# Patient Record
Sex: Male | Born: 1960
Health system: Southern US, Community
[De-identification: ages and names within clinical notes are randomized; demographics above are authoritative.]

## PROBLEM LIST (undated history)

## (undated) DIAGNOSIS — D509 Iron deficiency anemia, unspecified: Secondary | ICD-10-CM

## (undated) DIAGNOSIS — K449 Diaphragmatic hernia without obstruction or gangrene: Secondary | ICD-10-CM

## (undated) DIAGNOSIS — I719 Aortic aneurysm of unspecified site, without rupture: Secondary | ICD-10-CM

## (undated) DIAGNOSIS — E785 Hyperlipidemia, unspecified: Secondary | ICD-10-CM

## (undated) DIAGNOSIS — T7840XA Allergy, unspecified, initial encounter: Secondary | ICD-10-CM

## (undated) DIAGNOSIS — G4730 Sleep apnea, unspecified: Secondary | ICD-10-CM

## (undated) DIAGNOSIS — I499 Cardiac arrhythmia, unspecified: Secondary | ICD-10-CM

## (undated) DIAGNOSIS — E669 Obesity, unspecified: Secondary | ICD-10-CM

## (undated) DIAGNOSIS — E559 Vitamin D deficiency, unspecified: Secondary | ICD-10-CM

## (undated) DIAGNOSIS — N400 Enlarged prostate without lower urinary tract symptoms: Secondary | ICD-10-CM

## (undated) DIAGNOSIS — M549 Dorsalgia, unspecified: Secondary | ICD-10-CM

## (undated) DIAGNOSIS — R112 Nausea with vomiting, unspecified: Secondary | ICD-10-CM

## (undated) DIAGNOSIS — I10 Essential (primary) hypertension: Secondary | ICD-10-CM

## (undated) DIAGNOSIS — K219 Gastro-esophageal reflux disease without esophagitis: Secondary | ICD-10-CM

## (undated) DIAGNOSIS — M2550 Pain in unspecified joint: Secondary | ICD-10-CM

## (undated) DIAGNOSIS — Z9889 Other specified postprocedural states: Secondary | ICD-10-CM

## (undated) DIAGNOSIS — R600 Localized edema: Secondary | ICD-10-CM

## (undated) DIAGNOSIS — N281 Cyst of kidney, acquired: Secondary | ICD-10-CM

## (undated) HISTORY — DX: Dorsalgia, unspecified: M54.9

## (undated) HISTORY — DX: Cyst of kidney, acquired: N28.1

## (undated) HISTORY — DX: Hyperlipidemia, unspecified: E78.5

## (undated) HISTORY — DX: Pain in unspecified joint: M25.50

## (undated) HISTORY — DX: Diaphragmatic hernia without obstruction or gangrene: K44.9

## (undated) HISTORY — PX: MOUTH SURGERY: SHX715

## (undated) HISTORY — DX: Other specified postprocedural states: R11.2

## (undated) HISTORY — DX: Essential (primary) hypertension: I10

## (undated) HISTORY — PX: VASECTOMY: SHX75

## (undated) HISTORY — DX: Obesity, unspecified: E66.9

## (undated) HISTORY — DX: Cardiac arrhythmia, unspecified: I49.9

## (undated) HISTORY — DX: Other specified postprocedural states: Z98.890

## (undated) HISTORY — DX: Gastro-esophageal reflux disease without esophagitis: K21.9

## (undated) HISTORY — DX: Allergy, unspecified, initial encounter: T78.40XA

## (undated) HISTORY — DX: Vitamin D deficiency, unspecified: E55.9

## (undated) HISTORY — DX: Sleep apnea, unspecified: G47.30

## (undated) HISTORY — DX: Iron deficiency anemia, unspecified: D50.9

## (undated) HISTORY — DX: Benign prostatic hyperplasia without lower urinary tract symptoms: N40.0

## (undated) HISTORY — DX: Aortic aneurysm of unspecified site, without rupture: I71.9

## (undated) HISTORY — DX: Localized edema: R60.0

---

## 2000-03-29 ENCOUNTER — Encounter: Payer: Self-pay | Admitting: Specialist

## 2000-03-29 ENCOUNTER — Ambulatory Visit (HOSPITAL_COMMUNITY): Admission: RE | Admit: 2000-03-29 | Discharge: 2000-03-29 | Payer: Self-pay | Admitting: Specialist

## 2000-06-09 HISTORY — PX: OTHER SURGICAL HISTORY: SHX169

## 2004-10-21 ENCOUNTER — Ambulatory Visit: Payer: Self-pay | Admitting: Gastroenterology

## 2004-12-01 ENCOUNTER — Ambulatory Visit: Payer: Self-pay | Admitting: Gastroenterology

## 2004-12-01 ENCOUNTER — Encounter (INDEPENDENT_AMBULATORY_CARE_PROVIDER_SITE_OTHER): Payer: Self-pay | Admitting: Specialist

## 2007-03-08 ENCOUNTER — Ambulatory Visit (HOSPITAL_COMMUNITY): Admission: RE | Admit: 2007-03-08 | Discharge: 2007-03-08 | Payer: Self-pay | Admitting: Family Medicine

## 2007-07-17 HISTORY — PX: OTHER SURGICAL HISTORY: SHX169

## 2009-06-09 LAB — HM MAMMOGRAPHY

## 2010-09-09 ENCOUNTER — Encounter: Payer: Self-pay | Admitting: Family Medicine

## 2013-01-23 ENCOUNTER — Other Ambulatory Visit (INDEPENDENT_AMBULATORY_CARE_PROVIDER_SITE_OTHER): Payer: Managed Care, Other (non HMO)

## 2013-01-23 DIAGNOSIS — Z0000 Encounter for general adult medical examination without abnormal findings: Secondary | ICD-10-CM

## 2013-01-23 LAB — POCT CBC
Lymph, poc: 1.6 (ref 0.6–3.4)
MCH, POC: 30.5 pg (ref 27–31.2)
MCHC: 34.1 g/dL (ref 31.8–35.4)
MPV: 6.7 fL (ref 0–99.8)
POC Granulocyte: 3.6 (ref 2–6.9)
POC LYMPH PERCENT: 28.3 %L (ref 10–50)
Platelet Count, POC: 182 10*3/uL (ref 142–424)
RDW, POC: 13.1 %
WBC: 5.8 10*3/uL (ref 4.6–10.2)

## 2013-01-25 LAB — NMR, LIPOPROFILE
Cholesterol: 171 mg/dL (ref <200)
LDL Particle Number: 1445 nmol/L — ABNORMAL HIGH (ref <1000)
LDL Size: 20.7 nm (ref >20.5)
LDLC SERPL CALC-MCNC: 97 mg/dL (ref <100)
LP-IR Score: 63 — ABNORMAL HIGH (ref <=45)
Triglycerides by NMR: 150 mg/dL — ABNORMAL HIGH (ref <150)

## 2013-01-25 LAB — HEPATIC FUNCTION PANEL
ALT: 59 IU/L — ABNORMAL HIGH (ref 0–44)
Alkaline Phosphatase: 80 IU/L (ref 39–117)
Total Protein: 6.1 g/dL (ref 6.0–8.5)

## 2013-01-25 LAB — BMP8+EGFR
Calcium: 9 mg/dL (ref 8.7–10.2)
Chloride: 100 mmol/L (ref 97–108)
Glucose: 87 mg/dL (ref 65–99)
Potassium: 4.5 mmol/L (ref 3.5–5.2)
Sodium: 141 mmol/L (ref 134–144)

## 2013-01-30 ENCOUNTER — Encounter: Payer: Self-pay | Admitting: Family Medicine

## 2013-01-30 ENCOUNTER — Ambulatory Visit (INDEPENDENT_AMBULATORY_CARE_PROVIDER_SITE_OTHER): Payer: Managed Care, Other (non HMO) | Admitting: Family Medicine

## 2013-01-30 VITALS — BP 132/84 | HR 65 | Temp 98.4°F | Ht 69.5 in | Wt 224.0 lb

## 2013-01-30 DIAGNOSIS — L819 Disorder of pigmentation, unspecified: Secondary | ICD-10-CM

## 2013-01-30 DIAGNOSIS — N529 Male erectile dysfunction, unspecified: Secondary | ICD-10-CM | POA: Insufficient documentation

## 2013-01-30 DIAGNOSIS — Q828 Other specified congenital malformations of skin: Secondary | ICD-10-CM

## 2013-01-30 DIAGNOSIS — F411 Generalized anxiety disorder: Secondary | ICD-10-CM | POA: Insufficient documentation

## 2013-01-30 DIAGNOSIS — K219 Gastro-esophageal reflux disease without esophagitis: Secondary | ICD-10-CM | POA: Insufficient documentation

## 2013-01-30 DIAGNOSIS — Z0000 Encounter for general adult medical examination without abnormal findings: Secondary | ICD-10-CM

## 2013-01-30 DIAGNOSIS — N400 Enlarged prostate without lower urinary tract symptoms: Secondary | ICD-10-CM | POA: Insufficient documentation

## 2013-01-30 DIAGNOSIS — I10 Essential (primary) hypertension: Secondary | ICD-10-CM | POA: Insufficient documentation

## 2013-01-30 DIAGNOSIS — M545 Low back pain, unspecified: Secondary | ICD-10-CM | POA: Insufficient documentation

## 2013-01-30 DIAGNOSIS — E785 Hyperlipidemia, unspecified: Secondary | ICD-10-CM | POA: Insufficient documentation

## 2013-01-30 LAB — POCT URINALYSIS DIPSTICK
Glucose, UA: NEGATIVE
Leukocytes, UA: NEGATIVE
Spec Grav, UA: 1.02
Urobilinogen, UA: NEGATIVE

## 2013-01-30 LAB — POCT UA - MICROSCOPIC ONLY
Epithelial cells, urine per micros: NEGATIVE
WBC, Ur, HPF, POC: NEGATIVE

## 2013-01-30 MED ORDER — PRAVASTATIN SODIUM 40 MG PO TABS: 40.0000 mg | ORAL_TABLET | Freq: Every day | ORAL | Status: DC

## 2013-01-30 NOTE — Addendum Note (Signed)
Addended by: Roselyn Reef on: 01/30/2013 04:39 PM   Modules accepted: Orders

## 2013-01-30 NOTE — Progress Notes (Signed)
Subjective:    Patient ID: Jacob Cline, male    DOB: 12-22-1960, 52 y.o.   MRN: [id number]  HPIPT HERE TODAY FOR ANNUAL PHYSICAL. Patient's active problems were populated. On health maintenance parameters he will be given an FOBT today. Patient is not taking he is cholesterol medicine anymore. He is also not had the Paxil for a couple of years. He is also taking Levitra and Maxzide for blood pressure.   There are no active problems to display for this patient.  Outpatient Encounter Prescriptions as of 01/30/2013  Medication Sig Dispense Refill  . aspirin 81 MG chewable tablet Chew 81 mg by mouth daily.        . cholecalciferol (VITAMIN D) 1000 UNITS tablet Take 3,000 Units by mouth daily.      . diclofenac (VOLTAREN) 75 MG EC tablet Take 75 mg by mouth 2 (two) times daily.        [name] omeprazole (PRILOSEC) 20 MG capsule Take 20 mg by mouth daily.        [name] triamterene-hydrochlorothiazide (MAXZIDE-25) 37.5-25 MG per tablet Take 1 tablet by mouth daily.        . vardenafil (LEVITRA) 20 MG tablet Take 20 mg by mouth daily.        . [DISCONTINUED] ergocalciferol (VITAMIN D2) 50000 UNITS capsule Take 50,000 Units by mouth once a week.        . [DISCONTINUED] niacin-lovastatin (ADVICOR) 1000-20 MG 24 hr tablet Take 1 tablet by mouth at bedtime.        . [DISCONTINUED] PARoxetine (PAXIL-CR) 12.5 MG 24 hr tablet Take 12.5 mg by mouth every morning.         No facility-administered encounter medications on file as of 01/30/2013.       Review of Systems  Constitutional: Negative.   HENT: Negative.   Eyes: Negative.   Respiratory: Negative.   Cardiovascular: Negative.   Gastrointestinal: Negative.   Endocrine: Negative.   Genitourinary: Negative.   Musculoskeletal: Positive for back pain.  Skin: Positive for color change (DISCOLORATION OF SKIN- FACE AND BILATERAL LOWER EXTREMITIES.).  Allergic/Immunologic: Negative.   Neurological: Negative.   Hematological: Negative.    Psychiatric/Behavioral: Negative.        Objective:   Physical Exam  Nursing note and vitals reviewed. Constitutional: He is oriented to person, place, and time. He appears well-developed and well-nourished. No distress.  HENT:  Head: Normocephalic and atraumatic.  Right Ear: External ear normal.  Left Ear: External ear normal.  Mouth/Throat: Oropharynx is clear and moist. No oropharyngeal exudate.  Nasal congestion bilaterally  Eyes: Conjunctivae and EOM are normal. Pupils are equal, round, and reactive to light. Right eye exhibits no discharge. Left eye exhibits no discharge. No scleral icterus.  Neck: Normal range of motion. Neck supple. No thyromegaly present.  Cardiovascular: Normal rate, regular rhythm and intact distal pulses.   Murmur heard. At 72 per minute  Pulmonary/Chest: Effort normal and breath sounds normal. No respiratory distress. He has no wheezes. He has no rales.  Abdominal: Soft. Bowel sounds are normal. He exhibits no mass. There is tenderness (minimal epigastric tenderness). There is no rebound and no guarding.  Genitourinary: Rectum normal and penis normal.  Rectal exam was negative. Prostate exam was slightly enlarged without any nodules. No hernia was palpated.  Musculoskeletal: Normal range of motion. He exhibits no edema and no tenderness.  Lymphadenopathy:    He has no cervical adenopathy.  Neurological: He is alert and oriented to person, place, and time.  He has normal reflexes.  Skin: Skin is warm and dry. No rash noted. No erythema. No pallor.  Skin discoloration both lower legs. Seborrheic keratosis left temple  Psychiatric: He has a normal mood and affect. His behavior is normal. Judgment and thought content normal.   BP 132/84  Pulse 65  Temp(Src) 98.4 F (36.9 C) (Oral)  Ht 5' 9.5" (1.765 m)  Wt 224 lb (101.606 kg)  BMI 32.62 kg/m2 Cryotherapy to one skin tag in each groin Patient tolerated procedure well       Assessment & Plan:    1. Annual physical exam   2. Hypertension   3. Hyperlipidemia   4. Hiatal hernia with gastroesophageal reflux   5. BPH (benign prostatic hyperplasia)   6. Anxiety state, unspecified, history of   7. Low back pain   8. Erectile dysfunction   9. Accessory skin tags, cryotherapy one each groin    Orders Placed This Encounter  Procedures  . POCT urinalysis dipstick  . POCT UA - Microscopic Only  -repeat liver function tests because of elevated tests, his recent lab work Meds ordered this encounter  Medications  . cholecalciferol (VITAMIN D) 1000 UNITS tablet    Sig: Take 3,000 Units by mouth daily.  . pravastatin (PRAVACHOL) 40 MG tablet    Sig: Take 1 tablet (40 mg total) by mouth daily.    Dispense:  90 tablet    Refill:  3   Patient Instructions  Continue current medications. Continue good therapeutic lifestyle changes.  Fall precautions discussed with patient. Schedule your flu vaccine the first of October. Follow up as planned and earlier as needed.  Increase vitamin D3  To 5000 daily We will make a referral to the dermatologist to evaluate the facial lesion and a hemosiderin deposits in the leg After starting new cholesterol medicine if muscle aches or myalgias return discontinue the medication We will do liver function test today in followup to the previous one that was elevated    Nyra Capes MD

## 2013-01-30 NOTE — Addendum Note (Signed)
Addended by: Magdalene River on: 01/30/2013 04:25 PM   Modules accepted: Orders

## 2013-01-30 NOTE — Addendum Note (Signed)
Addended by: Magdalene River on: 01/30/2013 04:31 PM   Modules accepted: Orders

## 2013-01-30 NOTE — Patient Instructions (Addendum)
Continue current medications. Continue good therapeutic lifestyle changes.  Fall precautions discussed with patient. Schedule your flu vaccine the first of October. Follow up as planned and earlier as needed.  Increase vitamin D3  To 5000 daily We will make a referral to the dermatologist to evaluate the facial lesion and a hemosiderin deposits in the leg After starting new cholesterol medicine if muscle aches or myalgias return discontinue the medication We will do liver function test today in followup to the previous one that was elevated

## 2013-01-31 LAB — HEPATIC FUNCTION PANEL
Albumin: 4.2 g/dL (ref 3.5–5.5)
Total Protein: 6.5 g/dL (ref 6.0–8.5)

## 2013-02-01 LAB — URINE CULTURE: Organism ID, Bacteria: NO GROWTH

## 2013-03-14 ENCOUNTER — Other Ambulatory Visit: Payer: Self-pay | Admitting: Family Medicine

## 2013-03-26 ENCOUNTER — Ambulatory Visit: Payer: Managed Care, Other (non HMO) | Admitting: Family Medicine

## 2013-04-19 ENCOUNTER — Emergency Department (HOSPITAL_COMMUNITY)
Admission: EM | Admit: 2013-04-19 | Discharge: 2013-04-20 | Disposition: A | Discharge status: Home / Self Care | Payer: Managed Care, Other (non HMO) | Attending: Emergency Medicine | Admitting: Emergency Medicine

## 2013-04-19 ENCOUNTER — Encounter (HOSPITAL_COMMUNITY): Payer: Self-pay | Admitting: Emergency Medicine

## 2013-04-19 ENCOUNTER — Emergency Department (HOSPITAL_COMMUNITY): Payer: Managed Care, Other (non HMO)

## 2013-04-19 ENCOUNTER — Other Ambulatory Visit: Payer: Self-pay

## 2013-04-19 DIAGNOSIS — E785 Hyperlipidemia, unspecified: Secondary | ICD-10-CM | POA: Insufficient documentation

## 2013-04-19 DIAGNOSIS — E669 Obesity, unspecified: Secondary | ICD-10-CM | POA: Insufficient documentation

## 2013-04-19 DIAGNOSIS — R079 Chest pain, unspecified: Secondary | ICD-10-CM

## 2013-04-19 DIAGNOSIS — Z79899 Other long term (current) drug therapy: Secondary | ICD-10-CM | POA: Insufficient documentation

## 2013-04-19 DIAGNOSIS — Z87448 Personal history of other diseases of urinary system: Secondary | ICD-10-CM | POA: Insufficient documentation

## 2013-04-19 DIAGNOSIS — Z8719 Personal history of other diseases of the digestive system: Secondary | ICD-10-CM | POA: Insufficient documentation

## 2013-04-19 DIAGNOSIS — Z7982 Long term (current) use of aspirin: Secondary | ICD-10-CM | POA: Insufficient documentation

## 2013-04-19 DIAGNOSIS — I10 Essential (primary) hypertension: Secondary | ICD-10-CM | POA: Insufficient documentation

## 2013-04-19 DIAGNOSIS — Z791 Long term (current) use of non-steroidal anti-inflammatories (NSAID): Secondary | ICD-10-CM | POA: Insufficient documentation

## 2013-04-19 LAB — BASIC METABOLIC PANEL
BUN: 14 mg/dL (ref 6–23)
CO2: 31 mEq/L (ref 19–32)
Chloride: 99 mEq/L (ref 96–112)
Creatinine, Ser: 0.84 mg/dL (ref 0.50–1.35)
GFR calc Af Amer: >90 mL/min (ref >90)
Glucose, Bld: 95 mg/dL (ref 70–99)

## 2013-04-19 LAB — CBC
HCT: 49.6 % (ref 39.0–52.0)
Hemoglobin: 17.9 g/dL — ABNORMAL HIGH (ref 13.0–17.0)
MCH: 32.3 pg (ref 26.0–34.0)
MCV: 89.5 fL (ref 78.0–100.0)
Platelets: 196 10*3/uL (ref 150–400)
RDW: 12.9 % (ref 11.5–15.5)
WBC: 9.5 10*3/uL (ref 4.0–10.5)

## 2013-04-19 NOTE — ED Notes (Signed)
C/o constant dull pressure to L side of chest and midsternal chest pain x 3-4 days.  Reports headache since this morning.  Denies sob, nausea, vomiting, or diaphoresis.

## 2013-04-20 LAB — TROPONIN I: Troponin I: <0.3 ng/mL (ref <0.30)

## 2013-04-20 NOTE — ED Provider Notes (Signed)
CSN: [id number]     Arrival date & time 04/19/13  1949 History   First MD Initiated Contact with Patient 04/19/13 2044     Chief Complaint  Patient presents with  . Chest Pain   (Consider location/radiation/quality/duration/timing/severity/associated sxs/prior Treatment) HPI Jacob Cline is a 51 y.o. male who presents to the emergency department for concern of chest pain.  Patient reports that he has had constant chest pain over last two days.  Described as sharp.  Center of chest.  No radiation.  Worse when he moves a certain way.  Nonpleuritic.  No hemoptysis.  Mild in severity.  No SOB.  No cough.  No other symptoms.  Past Medical History  Diagnosis Date  . Hypertension   . Obesity   . Hyperlipidemia   . Hiatal hernia   . Hemorrhoids   . BPH (benign prostatic hypertrophy)    Past Surgical History  Procedure Laterality Date  . Athroscopy of rt knee  02/02  . Henia - umbilical surgery  07/17/07  . Vasectomy    . Mouth surgery     Family History  Problem Relation Age of Onset  . Colon cancer      family history   . Prostate cancer      family history   . Diabetes Mother    History  Substance Use Topics  . Smoking status: Never Smoker   . Smokeless tobacco: Not on file  . Alcohol Use: No    Review of Systems  Constitutional: Negative for fever and chills.  HENT: Negative for congestion and sore throat.   Respiratory: Negative for cough.   Cardiovascular: Positive for chest pain.  Gastrointestinal: Negative for nausea, vomiting, abdominal pain, diarrhea and constipation.  Endocrine: Negative for polyuria.  Genitourinary: Negative for dysuria and hematuria.  Musculoskeletal: Negative for neck pain.  Skin: Negative for rash.  Neurological: Negative for headaches.  Psychiatric/Behavioral: Negative.   All other systems reviewed and are negative.    Allergies  Codeine; Lovastatin; and Niaspan  Home Medications   Current Outpatient Rx  Name  Route  Sig   Dispense  Refill  . aspirin 81 MG chewable tablet   Oral   Chew 81 mg by mouth daily.           . cholecalciferol (VITAMIN D) 1000 UNITS tablet   Oral   Take 3,000 Units by mouth daily.         . diclofenac (VOLTAREN) 75 MG EC tablet   Oral   Take 75 mg by mouth 2 (two) times daily.           [name] omeprazole (PRILOSEC) 20 MG capsule   Oral   Take 20 mg by mouth daily.           [name] triamterene-hydrochlorothiazide (MAXZIDE-25) 37.5-25 MG per tablet      TAKE ONE TABLET BY MOUTH EVERY DAY   90 tablet   1   . vardenafil (LEVITRA) 20 MG tablet   Oral   Take 20 mg by mouth daily.            BP 125/89  Pulse 66  Temp(Src) 97.6 F (36.4 C) (Oral)  Resp 14  Ht 5\' 11"  (1.803 m)  Wt 219 lb 11.2 oz (99.655 kg)  BMI 30.66 kg/m2  SpO2 95% Physical Exam  Nursing note and vitals reviewed. Constitutional: He is oriented to person, place, and time. He appears well-developed and well-nourished. No distress.  HENT:  Head: Normocephalic and atraumatic.  Right Ear: External ear normal.  Left Ear: External ear normal.  Mouth/Throat: Oropharynx is clear and moist. No oropharyngeal exudate.  Eyes: Conjunctivae are normal. Pupils are equal, round, and reactive to light. Right eye exhibits no discharge.  Neck: Normal range of motion. Neck supple. No tracheal deviation present.  Cardiovascular: Normal rate, regular rhythm and intact distal pulses.   Pulmonary/Chest: Effort normal. No respiratory distress. He has no wheezes. He has no rales.  Abdominal: Soft. He exhibits no distension. There is no tenderness. There is no rebound and no guarding.  Musculoskeletal: Normal range of motion.  Neurological: He is alert and oriented to person, place, and time.  Skin: Skin is warm and dry. No rash noted. He is not diaphoretic.  Psychiatric: He has a normal mood and affect.    ED Course  Procedures (including critical care time) Labs Review Labs Reviewed  CBC - Abnormal; Notable for the  following:    Hemoglobin 17.9 (*)    MCHC 36.1 (*)    All other components within normal limits  BASIC METABOLIC PANEL - Abnormal; Notable for the following:    Potassium 3.4 (*)    All other components within normal limits  TROPONIN I  POCT I-STAT TROPONIN I   Imaging Review Dg Chest 2 View  04/19/2013   CLINICAL DATA:  Chest pain.  EXAM: CHEST  2 VIEW  COMPARISON:  None.  FINDINGS: Normal heart size with clear lung fields.  No bony abnormality.  IMPRESSION: No active cardiopulmonary disease.   Electronically Signed   By: Davonna Belling M.D.   On: 04/19/2013 20:49    EKG Interpretation   None       MDM   1. Chest pain    Jacob Cline is a 52 y.o. male who presents to the emergency department for concern of isolated chest pain.  Very atypical story for MI.  Delta troponin checked and negative.  Doubt PE or dissection based on history and exam.  Likely MSK in origin.  However, given that patient is 57 with some risk factors, recommend that he f/u with cardiology to discuss potential outpatient stress testing.  All questions and return precautions discussed including worsening of symptoms.  Patient discharged.    Arloa Koh, MD 04/21/13 [phone]

## 2013-04-20 NOTE — ED Notes (Signed)
PT given d/c instructions and verbalized understanding. NAD at this time. VS are stable.  

## 2013-04-21 NOTE — ED Provider Notes (Signed)
I saw and evaluated the patient, reviewed the resident's note and I agree with the findings and plan.  EKG Interpretation   None       EKG - NSR, rate 74, normal axis, normal intervals, nonspecific t wave changes, no priors for comparison.  52 yo male with hx of HTN, HLD presenting with several days of chest pain, constant since early this morning.  Pain resolved at time of my exam.  On exam, well appearing, normal heart sounds, no m/r/g, lungs CTAB.  He has some nonspecific EKG changes, on review of outside records, he has had some t wave inversions on prior ekg (although unable to visualize this ekg myself).  He ruled out for MI with delta troponin.  Due to EKG changes, have advised close cardiology follow up for further testing.   Clinical Impression: 1. Chest pain       Candyce Churn, MD 04/21/13 1041

## 2013-04-21 NOTE — ED Provider Notes (Signed)
I saw and evaluated the patient, reviewed the resident's note and I agree with the findings and plan.  EKG Interpretation   None         Candyce Churn, MD 04/21/13 1041

## 2013-05-28 ENCOUNTER — Encounter: Payer: Self-pay | Admitting: Cardiovascular Disease

## 2013-05-28 ENCOUNTER — Ambulatory Visit (INDEPENDENT_AMBULATORY_CARE_PROVIDER_SITE_OTHER): Payer: Managed Care, Other (non HMO) | Admitting: Cardiovascular Disease

## 2013-05-28 VITALS — BP 134/85 | HR 63 | Ht 71.0 in | Wt 224.0 lb

## 2013-05-28 DIAGNOSIS — R0789 Other chest pain: Secondary | ICD-10-CM

## 2013-05-28 DIAGNOSIS — I10 Essential (primary) hypertension: Secondary | ICD-10-CM

## 2013-05-28 DIAGNOSIS — E785 Hyperlipidemia, unspecified: Secondary | ICD-10-CM

## 2013-05-28 DIAGNOSIS — R079 Chest pain, unspecified: Secondary | ICD-10-CM

## 2013-05-28 DIAGNOSIS — R9431 Abnormal electrocardiogram [ECG] [EKG]: Secondary | ICD-10-CM

## 2013-05-28 NOTE — Assessment & Plan Note (Signed)
I've encouraged him to exercise regularly. This will also help her lose weight.

## 2013-05-28 NOTE — Assessment & Plan Note (Signed)
Has not tolerated statins in the past. We will continue to encourage him to work on a good diet and exercise program. We may need to send him to the lipid clinic.

## 2013-05-28 NOTE — Progress Notes (Signed)
Jacob CaneJames H Cline Date of Birth  [birth year]       [facility]    [place] [facility] [address]  [address], [address] [place], [place]  [address]   [place]  [address] [phone]     [phone]   Fax  [phone]    Fax [phone]  Problem List: 1. Chest pain:  seen in the emergency room 2. Hypertension 3. Hyperlipidemia   History of Present Illness:  I have seen Jacob Cline in the past for CP and abnormal ECG.  We have performed a cath in the past which was normal.   He also has had a stress myoview which was normal.    Jacob Cline had an episode of chest heaviness about a month ago.  Also associated with a head ache.  Was seen in the ER, work up was negative.   He has had some occasional recurent chest pain,.  Dull , shooting pain, comes and goes.   Also seems to have chest heaviness that comes on at random times ( not associated with any specific activity) which Seems to be relived with antiacids.  Has not seen his medical doctor.    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    He does not get any regular exercise.   He has taken Lovastatin in the past but stopped taking it due to  muscle aches.    Current Outpatient Prescriptions on File Prior to Visit  Medication Sig Dispense Refill  . aspirin 81 MG chewable tablet Chew 81 mg by mouth daily.        . diclofenac (VOLTAREN) 75 MG EC tablet Take 75 mg by mouth daily.       [name]. omeprazole (PRILOSEC) 20 MG capsule Take 20 mg by mouth daily.        [name]. triamterene-hydrochlorothiazide (MAXZIDE-25) 37.5-25 MG per tablet TAKE ONE TABLET BY MOUTH EVERY DAY  90 tablet  1  . vardenafil (LEVITRA) 20 MG tablet Take 20 mg by mouth daily.         No current facility-administered medications on file prior to visit.    Allergies  Allergen Reactions  . Codeine     Unknown   . Lovastatin     Makes me hurt alot  . Niaspan [Niacin Er]     Make me hurt alot    Past Medical History    Diagnosis Date  . Hypertension   . Obesity   . Hyperlipidemia   . Hiatal hernia   . Hemorrhoids   . BPH (benign prostatic hypertrophy)     Past Surgical History  Procedure Laterality Date  . Athroscopy of rt knee  02/02  . Henia - umbilical surgery  07/17/07  . Vasectomy    . Mouth surgery      History  Smoking status  . Never Smoker   Smokeless tobacco  . Not on file    History  Alcohol Use No    Family History  Problem Relation Age of Onset  . Colon cancer      family history   . Prostate cancer      family history   . Diabetes Mother     Reviw of Systems:  Reviewed in the HPI.  All other systems are negative.  Physical Exam: Blood pressure 134/85, pulse 63, height 5\' 11"  (1.803 m), weight 224 lb (101.606 kg). General: Well developed, well nourished, in no acute distress.  Head:  Normocephalic, atraumatic, sclera non-icteric, mucus membranes are moist,   Neck: Supple. Carotids are 2 + without bruits. No JVD   Lungs: Clear   Heart: RR, normal S1 and S2.  Abdomen: Soft, non-tender, non-distended with normal bowel sounds.  Msk:  Strength and tone are normal   Extremities: No clubbing or cyanosis. No edema.  Distal pedal pulses are 2+ and equal    Neuro: CN II - XII intact.  Alert and oriented X 3.   Psych:  Normal   ECG: Dec. 12, 2014:  NSR TWI in the ant. lat  Jan. 20, 2015:  The TWI have improved.   Assessment / Plan:

## 2013-05-28 NOTE — Patient Instructions (Signed)
Your physician recommends that you schedule a follow-up appointment in: 3 MONTHS   Your physician has requested that you have a lexiscan myoview. . Please follow instruction sheet, as given.

## 2013-05-28 NOTE — Assessment & Plan Note (Signed)
Changes seen back for a visit for chest discomfort and abnormal EKG. I have seen him in the past for similar complaints. He's had a normal heart catheterization. We also have performed a stress Myoview study which was normal.  Once again he presents with symptoms that are somewhat worrisome. The his pains are described as a chest discomfort which is a tightness. There is some question as to whether it's relieved with antacids.  He has a abnormal EKG with T-wave inversions in the inferior and lateral leads. We'll schedule him for a lexiscan myoview study.  I will see him back in the office in 3 months for followup visit.

## 2013-06-05 ENCOUNTER — Ambulatory Visit (INDEPENDENT_AMBULATORY_CARE_PROVIDER_SITE_OTHER): Payer: Managed Care, Other (non HMO)

## 2013-06-05 ENCOUNTER — Ambulatory Visit (INDEPENDENT_AMBULATORY_CARE_PROVIDER_SITE_OTHER): Payer: Managed Care, Other (non HMO) | Admitting: Family Medicine

## 2013-06-05 ENCOUNTER — Encounter: Payer: Self-pay | Admitting: Family Medicine

## 2013-06-05 VITALS — BP 122/79 | HR 67 | Temp 97.0°F | Ht 71.0 in | Wt 222.0 lb

## 2013-06-05 DIAGNOSIS — I10 Essential (primary) hypertension: Secondary | ICD-10-CM

## 2013-06-05 DIAGNOSIS — M25559 Pain in unspecified hip: Secondary | ICD-10-CM

## 2013-06-05 DIAGNOSIS — M25551 Pain in right hip: Secondary | ICD-10-CM

## 2013-06-05 DIAGNOSIS — E785 Hyperlipidemia, unspecified: Secondary | ICD-10-CM

## 2013-06-05 DIAGNOSIS — N400 Enlarged prostate without lower urinary tract symptoms: Secondary | ICD-10-CM

## 2013-06-05 DIAGNOSIS — E559 Vitamin D deficiency, unspecified: Secondary | ICD-10-CM

## 2013-06-05 DIAGNOSIS — R079 Chest pain, unspecified: Secondary | ICD-10-CM

## 2013-06-05 DIAGNOSIS — Z1212 Encounter for screening for malignant neoplasm of rectum: Secondary | ICD-10-CM

## 2013-06-05 MED ORDER — DICLOFENAC SODIUM 75 MG PO TBEC: 75.0000 mg | DELAYED_RELEASE_TABLET | Freq: Two times a day (BID) | ORAL | Status: DC

## 2013-06-05 NOTE — Patient Instructions (Addendum)
Continue current medications. Continue good therapeutic lifestyle changes which include good diet and exercise. Fall precautions discussed with patient. Schedule your flu vaccine if you haven't had it yet If you are over 55 years old - you may need Prevnar 13 or the adult Pneumonia vaccine. Watch sodium intake more closely Use warm wet compresses to right hip if possible 20 minutes 3 or 4 times daily Check with your insurance regarding the Prevnar vaccine and the Zostavax Do not take any diclofenac for one week Increase the omeprazole to one twice daily before breakfast and supper Hold the caffeine We will call you with the results of the FOBT once those results are unavailable We will let you know the results of the lab work once that is returned Samples of Levitra were given to the patient Wear support hose for one to 2 weeks to see if it helps your legs feel better

## 2013-06-05 NOTE — Progress Notes (Signed)
Subjective:    Patient ID: Jacob Cline, male    DOB: 06-04-1960, 53 y.o.   MRN: [id number]  HPI Pt here for follow up and management of chronic medical problems. Patient continues to have chest pain and is scheduled for a chemical stress test next week. He also has concerns about some right lateral leg pain. He also has concerns regarding right hip pain that is worse with sleeping at nighttime.       Patient Active Problem List   Diagnosis Date Noted  . Chest discomfort 05/28/2013  . Hypertension 01/30/2013  . Hyperlipidemia 01/30/2013  . Hiatal hernia with gastroesophageal reflux 01/30/2013  . BPH (benign prostatic hyperplasia) 01/30/2013  . Anxiety state, unspecified, history of 01/30/2013  . Low back pain 01/30/2013  . Erectile dysfunction 01/30/2013   Outpatient Encounter Prescriptions as of 06/05/2013  Medication Sig  . aspirin 81 MG chewable tablet Chew 81 mg by mouth daily.    . cholecalciferol (VITAMIN D) 1000 UNITS tablet Take 1,000 Units by mouth daily. 3 tablets daily  . Cinnamon 500 MG capsule 500 mg.  . diclofenac (VOLTAREN) 75 MG EC tablet Take 75 mg by mouth daily.   . Multiple Vitamin (MULTIVITAMIN) tablet 1 tablet.  . Omega-3 Fatty Acids (FISH OIL) 1000 MG CAPS 1 capsule.  [name] omeprazole (PRILOSEC) 20 MG capsule Take 20 mg by mouth daily.    [name] triamterene-hydrochlorothiazide (MAXZIDE-25) 37.5-25 MG per tablet TAKE ONE TABLET BY MOUTH EVERY DAY  . vardenafil (LEVITRA) 20 MG tablet Take 20 mg by mouth daily.     Review of Systems  Constitutional: Negative.   HENT: Negative.   Eyes: Negative.   Respiratory: Positive for chest tightness (left side - went to er [facility] in December for this). Negative for shortness of breath.   Cardiovascular: Negative.   Gastrointestinal: Negative.   Endocrine: Negative.   Genitourinary: Negative.   Musculoskeletal: Negative.   Skin: Negative.   Allergic/Immunologic: Negative.   Neurological: Negative.   Hematological:  Negative.   Psychiatric/Behavioral: Negative.        Objective:   Physical Exam  Nursing note and vitals reviewed. Constitutional: He is oriented to person, place, and time. He appears well-developed and well-nourished. He appears distressed (somewhat distressed and anxious regarding his aches and pains.).  HENT:  Head: Normocephalic and atraumatic.  Right Ear: External ear normal.  Left Ear: External ear normal.  Mouth/Throat: Oropharynx is clear and moist. No oropharyngeal exudate.  Nasal congestion bilaterally  Eyes: Conjunctivae and EOM are normal. Pupils are equal, round, and reactive to light. Right eye exhibits no discharge. Left eye exhibits no discharge. No scleral icterus.  Neck: Normal range of motion. Neck supple. No thyromegaly present.   No carotid bruits auscultated  Cardiovascular: Normal rate, regular rhythm, normal heart sounds and intact distal pulses.  Exam reveals no gallop and no friction rub.   No murmur heard. At 72 per minute  Pulmonary/Chest: Effort normal and breath sounds normal. No respiratory distress. He has no wheezes. He has no rales. He exhibits no tenderness.  No axillary nodes  Abdominal: Soft. Bowel sounds are normal. He exhibits no mass. There is no tenderness. There is no rebound and no guarding.  Obesity present and no inguinal nodes  Musculoskeletal: Normal range of motion. He exhibits edema ( minimal pedal edema bilaterally). He exhibits no tenderness.  Lymphadenopathy:    He has no cervical adenopathy.  Neurological: He is alert and oriented to person, place, and time. He  has normal reflexes. No cranial nerve deficit.  Skin: Skin is warm and dry. No rash noted. No erythema. No pallor.  Psychiatric: He has a normal mood and affect. His behavior is normal. Judgment and thought content normal.   BP 122/79  Pulse 67  Temp(Src) 97 F (36.1 C) (Oral)  Ht _0  (1.803 m)  Wt 222 lb (100.699 kg)  BMI 30.98 kg/m2  [facility] reading (PRIMARY) by   Dr.Moore-right hip --Narrowed hip joint space, osteoarthritis                                        Assessment & Plan:  1. BPH (benign prostatic hyperplasia) - POCT CBC; Future  2. Hyperlipidemia - POCT CBC; Future - NMR, lipoprofile; Future  3. Hypertension - POCT CBC; Future - BMP8+EGFR; Future - Hepatic function panel; Future  4. Vitamin D deficiency - Vit D  25 hydroxy (rtn osteoporosis monitoring); Future  5. Right hip pain - DG Hip Complete Right; Future  6. Chest pain  7. Screening for malignant neoplasm of the rectum - Fecal occult blood, imunochemical Patient Instructions  Continue current medications. Continue good therapeutic lifestyle changes which include good diet and exercise. Fall precautions discussed with patient. Schedule your flu vaccine if you haven't had it yet If you are over 21 years old - you may need Prevnar 39 or the adult Pneumonia vaccine. Watch sodium intake more closely Use warm wet compresses to right hip if possible 20 minutes 3 or 4 times daily Check with your insurance regarding the Prevnar vaccine and the Zostavax Do not take any diclofenac for one week Increase the omeprazole to one twice daily before breakfast and supper Hold the caffeine We will call you with the results of the FOBT once those results are unavailable We will let you know the results of the lab work once that is returned Samples of Levitra were given to the patient Wear support hose for one to 2 weeks to see if it helps your legs feel better   Arrie Senate MD

## 2013-06-07 LAB — FECAL OCCULT BLOOD, IMMUNOCHEMICAL: Fecal Occult Bld: NEGATIVE

## 2013-06-12 ENCOUNTER — Ambulatory Visit (HOSPITAL_COMMUNITY): Payer: Managed Care, Other (non HMO) | Attending: Cardiology | Admitting: Radiology

## 2013-06-12 VITALS — BP 139/89 | Ht 71.0 in | Wt 218.0 lb

## 2013-06-12 DIAGNOSIS — E785 Hyperlipidemia, unspecified: Secondary | ICD-10-CM | POA: Insufficient documentation

## 2013-06-12 DIAGNOSIS — R9431 Abnormal electrocardiogram [ECG] [EKG]: Secondary | ICD-10-CM

## 2013-06-12 DIAGNOSIS — I10 Essential (primary) hypertension: Secondary | ICD-10-CM | POA: Insufficient documentation

## 2013-06-12 DIAGNOSIS — R079 Chest pain, unspecified: Secondary | ICD-10-CM

## 2013-06-12 MED ORDER — TECHNETIUM TC 99M SESTAMIBI GENERIC - CARDIOLITE
11.0000 | Freq: Once | INTRAVENOUS | Status: AC | PRN
  Administered 2013-06-12: 11 via INTRAVENOUS

## 2013-06-12 MED ORDER — REGADENOSON 0.4 MG/5ML IV SOLN
0.4000 mg | Freq: Once | INTRAVENOUS | Status: AC
  Administered 2013-06-12: 0.4 mg via INTRAVENOUS

## 2013-06-12 MED ORDER — TECHNETIUM TC 99M SESTAMIBI GENERIC - CARDIOLITE
33.0000 | Freq: Once | INTRAVENOUS | Status: AC | PRN
  Administered 2013-06-12: 33 via INTRAVENOUS

## 2013-06-12 NOTE — Progress Notes (Signed)
[facility] SITE 3 NUCLEAR MED [address] [place], [place] [address] [phone]    Cardiology Nuclear Med Study  Jacob Cline is a 53 y.o. male     MRN : [id number]     DOB: January 07, 1961  Procedure Date: 06/12/2013  Nuclear Med Background Indication for Stress Test:  Evaluation for Ischemia History: No Known History of CAD;Echo;Previous Nuclear Study (no report)Nml Cardiac Risk Factors: Hypertension and Lipids  Symptoms:  Chest Pain   Nuclear Pre-Procedure Caffeine/Decaff Intake:  None > 12 hrs NPO After: 10:30pm   Lungs:  clear O2 Sat: 95% on room air. IV 0.9% NS with Angio Cath:  20g  IV Site: R Antecubital x 1, tolerated well IV Started by:  Irean HongPatsy Edwards, RN  Chest Size (in):  46 Cup Size: n/a  Height: 5\' 11"  (1.803 m)  Weight:  218 lb (98.884 kg)  BMI:  Body mass index is 30.42 kg/(m^2). Tech Comments: L.Gerhardt NP consulted about the changes on the patient's EKG. Pictures checked without any immediate problem. B.Cline to read the images later in the day. TJohnson    Nuclear Med Study 1 or 2 day study: 1 day  Stress Test Type:  Lexiscan  Reading MD: N/A  Order Authorizing Provider:  Kristeen MissPhilip Nahser, MD  Resting Radionuclide: Technetium 485m Sestamibi  Resting Radionuclide Dose: 11.0 mCi   Stress Radionuclide:  Technetium 7785m Sestamibi  Stress Radionuclide Dose: 33.0 mCi           Stress Protocol Rest HR: 60 Stress HR: 104  Rest BP: 139/89 Stress BP: 134/80  Exercise Time (min): n/a METS: n/a   Predicted Max HR: 166 bpm % Max HR: 62.65 bpm Rate Pressure Product: [id number]   Dose of Adenosine (mg):  n/a Dose of Lexiscan: 0.4 mg  Dose of Atropine (mg): n/a Dose of Dobutamine: n/a mcg/kg/min (at max HR)  Stress Test Technologist: Frederick Peerseresa Johnson, EMT-P  Nuclear Technologist:  Harlow AsaElizabeth Young, CNMT     Rest Procedure:  Myocardial perfusion imaging was performed at rest 45 minutes following the intravenous administration of Technetium 4485m Sestamibi. Rest  ECG: NSR with non-specific ST-T wave changes  Stress Procedure:  The patient received IV Lexiscan 0.4 mg over 15-seconds.  Technetium 7085m Sestamibi injected at 30-seconds.  Quantitative spect images were obtained after a 45 minute delay. Stress ECG: No significant ST segment change suggestive of ischemia.  QPS Raw Data Images:  Acquisition technically good; normal left ventricular size. Stress Images:  Normal homogeneous uptake in all areas of the myocardium. Rest Images:  Normal homogeneous uptake in all areas of the myocardium. Subtraction (SDS):  No evidence of ischemia. Transient Ischemic Dilatation (Normal <1.22): 1.02  Lung /Heart  :0.57 Quantitative Gated Spect Images QGS EDV:  95 ml QGS ESV:  41 ml  Impression Exercise Capacity:  Lexiscan with no exercise. BP Response:  Normal blood pressure response. Clinical Symptoms:  No chest pain or dyspnea. ECG Impression:  No significant ST segment change suggestive of ischemia (nondiagnostic TWI inferior lateral leads). Comparison with Prior Nuclear Study: No images to compare  Overall Impression:  Normal stress nuclear study.  LV Ejection Fraction: 57%.  LV Wall Motion:  NL LV Function; NL Wall Motion  Jacob Cline

## 2013-08-04 ENCOUNTER — Other Ambulatory Visit: Payer: Self-pay | Admitting: Family Medicine

## 2013-08-07 ENCOUNTER — Encounter: Payer: Self-pay | Admitting: Podiatry

## 2013-08-07 ENCOUNTER — Ambulatory Visit (INDEPENDENT_AMBULATORY_CARE_PROVIDER_SITE_OTHER): Payer: Managed Care, Other (non HMO) | Admitting: Podiatry

## 2013-08-07 VITALS — BP 130/86 | HR 69 | Ht 71.0 in | Wt 222.0 lb

## 2013-08-07 DIAGNOSIS — B351 Tinea unguium: Secondary | ICD-10-CM

## 2013-08-07 DIAGNOSIS — M79606 Pain in leg, unspecified: Secondary | ICD-10-CM

## 2013-08-07 DIAGNOSIS — Q828 Other specified congenital malformations of skin: Secondary | ICD-10-CM

## 2013-08-07 DIAGNOSIS — M79609 Pain in unspecified limb: Secondary | ICD-10-CM

## 2013-08-07 NOTE — Progress Notes (Signed)
Subjective: 53 year old male presents complaining of painful callus and toe nails.  Objective: Dermatologic: Porokeratotic lesions at 5th MPJ left foot. Pinch calluses under the first MPJ bilateral. Thick dystrophic nails on both great toes.  Orthopedic:  Rectus foot without gross deformities. Neurovascular status are within normal.   Assessment: Painful porokeratosis sub 5 bilateral. Onychomycosis both great toes.  Plan: All lesions and abnormal nails debrided. Return as needed.

## 2013-08-07 NOTE — Patient Instructions (Signed)
Seen for painful calluses and toe nails. All debrided. Return in 3 months or as needed.

## 2013-09-10 ENCOUNTER — Ambulatory Visit (INDEPENDENT_AMBULATORY_CARE_PROVIDER_SITE_OTHER): Payer: Managed Care, Other (non HMO) | Admitting: Cardiovascular Disease

## 2013-09-10 ENCOUNTER — Encounter: Payer: Self-pay | Admitting: Cardiovascular Disease

## 2013-09-10 VITALS — BP 140/80 | HR 68 | Ht 71.0 in | Wt 225.0 lb

## 2013-09-10 DIAGNOSIS — R0789 Other chest pain: Secondary | ICD-10-CM

## 2013-09-10 NOTE — Patient Instructions (Signed)
Continue same medications.   Your physician wants you to follow-up in: 1 year.  You will receive a reminder letter in the mail two months in advance. If you don't receive a letter, please call our office to schedule the follow-up appointment.  

## 2013-09-10 NOTE — Assessment & Plan Note (Signed)
Jacob MarshallJimmy is doing well. He's not having significant anginal pain. His Myoview study was normal. I've encouraged him to try to eat a little bit better. A person to try to exercise as much as possible. I'll see him again in one year.  His medical doctor has been managing his lipids/

## 2013-09-10 NOTE — Progress Notes (Signed)
Jacob Cline Date of Birth  24-Dec-1960       [facility]    [facility] [place] [facility] [address]  [address] [place], [place]  [address]   [place], [place]  [address] [phone]     [phone]   Fax  [phone]    Fax [phone]  Problem List: 1. Chest pain:  seen in the emergency room 2. Hypertension 3. Hyperlipidemia   History of Present Illness:  I have seen Jacob Cline in the past for CP and abnormal ECG.  We have performed a cath in the past which was normal.   He also has had a stress myoview which was normal.    Jacob Cline had an episode of chest heaviness about a month ago.  Also associated with a head ache.  Was seen in the ER, work up was negative.   He has had some occasional recurent chest pain,.  Dull , shooting pain, comes and goes.   Also seems to have chest heaviness that comes on at random times ( not associated with any specific activity) which Seems to be relived with antiacids.  Has not seen his medical doctor.    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    He does not get any regular exercise.   He has taken Lovastatin in the past but stopped taking it due to  muscle aches.    Sep 10, 2013:  Jacob Cline was last seen in January, 2015. He had a stress Myoview study for recurrent episodes of chest discomfort.  The Myoview showed no evidence of ischemia. His left ventricular systolic function was normal with an EF of 57%.  Still having some CP.   He works 14 hour days ( 2 AM to 4 PM)   so has limited time to exercise.  Tries not to eat any fried foods by sometimes does not have a good option.   Current Outpatient Prescriptions on File Prior to Visit  Medication Sig Dispense Refill  . aspirin 81 MG chewable tablet Chew 81 mg by mouth daily.        . cholecalciferol (VITAMIN D) 1000 UNITS tablet Take 1,000 Units by mouth daily. 3 tablets daily      . Cinnamon 500 MG capsule 500 mg.      . diclofenac (VOLTAREN) 75 MG EC  tablet Take 1 tablet (75 mg total) by mouth 2 (two) times daily.  60 tablet  2  . Multiple Vitamin (MULTIVITAMIN) tablet 1 tablet.      . Omega-3 Fatty Acids (FISH OIL) 1000 MG CAPS 1 capsule.      [name]. omeprazole (PRILOSEC) 20 MG capsule TAKE ONE CAPSULE BY MOUTH ONCE DAILY  90 capsule  0  . triamterene-hydrochlorothiazide (MAXZIDE-25) 37.5-25 MG per tablet TAKE ONE TABLET BY MOUTH EVERY DAY  90 tablet  1  . vardenafil (LEVITRA) 20 MG tablet Take 20 mg by mouth daily.         No current facility-administered medications on file prior to visit.    Allergies  Allergen Reactions  . Codeine     Unknown   . Lovastatin     Makes me hurt alot  . Niaspan [Niacin Er]     Make me hurt alot    Past Medical History  Diagnosis Date  . Hypertension   . Obesity   . Hyperlipidemia   . Hiatal hernia   . Hemorrhoids   . BPH (benign prostatic  hypertrophy)     Past Surgical History  Procedure Laterality Date  . Athroscopy of rt knee  02/02  . Henia - umbilical surgery  07/17/07  . Vasectomy    . Mouth surgery      History  Smoking status  . Never Smoker   Smokeless tobacco  . Never Used    History  Alcohol Use No    Family History  Problem Relation Age of Onset  . Colon cancer      family history   . Prostate cancer      family history   . Diabetes Mother     Reviw of Systems:  Reviewed in the HPI.  All other systems are negative.  Physical Exam: Blood pressure 140/80, pulse 68, height 5\' 11"  (1.803 m), weight 225 lb (102.059 kg). General: Well developed, well nourished, in no acute distress.  Head: Normocephalic, atraumatic, sclera non-icteric, mucus membranes are moist,   Neck: Supple. Carotids are 2 + without bruits. No JVD   Lungs: Clear   Heart: RR, normal S1 and S2.  Abdomen: Soft, non-tender, non-distended with normal bowel sounds.  Msk:  Strength and tone are normal   Extremities: No clubbing or cyanosis. No edema.  Distal pedal pulses are 2+ and equal     Neuro: CN II - XII intact.  Alert and oriented X 3.   Psych:  Normal   ECG: Dec. 12, 2014:  NSR TWI in the ant. lat  Jan. 20, 2015:  The TWI have improved.   Assessment / Plan:

## 2013-09-23 ENCOUNTER — Other Ambulatory Visit: Payer: Self-pay | Admitting: Family Medicine

## 2013-10-08 ENCOUNTER — Ambulatory Visit (INDEPENDENT_AMBULATORY_CARE_PROVIDER_SITE_OTHER): Payer: Managed Care, Other (non HMO) | Admitting: Family Medicine

## 2013-10-08 ENCOUNTER — Encounter: Payer: Self-pay | Admitting: Family Medicine

## 2013-10-08 VITALS — BP 137/87 | HR 62 | Temp 98.3°F | Ht 71.0 in | Wt 224.0 lb

## 2013-10-08 DIAGNOSIS — E785 Hyperlipidemia, unspecified: Secondary | ICD-10-CM

## 2013-10-08 DIAGNOSIS — N529 Male erectile dysfunction, unspecified: Secondary | ICD-10-CM

## 2013-10-08 DIAGNOSIS — I10 Essential (primary) hypertension: Secondary | ICD-10-CM

## 2013-10-08 DIAGNOSIS — M25551 Pain in right hip: Secondary | ICD-10-CM

## 2013-10-08 DIAGNOSIS — M25559 Pain in unspecified hip: Secondary | ICD-10-CM

## 2013-10-08 DIAGNOSIS — L989 Disorder of the skin and subcutaneous tissue, unspecified: Secondary | ICD-10-CM

## 2013-10-08 DIAGNOSIS — E559 Vitamin D deficiency, unspecified: Secondary | ICD-10-CM

## 2013-10-08 DIAGNOSIS — N400 Enlarged prostate without lower urinary tract symptoms: Secondary | ICD-10-CM

## 2013-10-08 LAB — POCT URINALYSIS DIPSTICK
Bilirubin, UA: NEGATIVE
Blood, UA: NEGATIVE
Glucose, UA: NEGATIVE
Ketones, UA: NEGATIVE
LEUKOCYTES UA: NEGATIVE
NITRITE UA: NEGATIVE
PH UA: 6.5
Spec Grav, UA: 1.01
UROBILINOGEN UA: NEGATIVE

## 2013-10-08 LAB — POCT UA - MICROSCOPIC ONLY
Bacteria, U Microscopic: NEGATIVE
CASTS, UR, LPF, POC: NEGATIVE
CRYSTALS, UR, HPF, POC: NEGATIVE
EPITHELIAL CELLS, URINE PER MICROSCOPY: NEGATIVE
Mucus, UA: NEGATIVE
RBC, urine, microscopic: NEGATIVE
WBC, Ur, HPF, POC: NEGATIVE
YEAST UA: NEGATIVE

## 2013-10-08 NOTE — Patient Instructions (Addendum)
  Continue current medications. Continue good therapeutic lifestyle changes which include good diet and exercise. Fall precautions discussed with patient. If an FOBT was given today- please return it to our front desk. If you are over 55 years old - you may need Prevnar 13 or the adult Pneumonia vaccine.  Please check with her insurance regarding the Prevnar vaccine We will arrange an appointment to see the dermatologist about your nose and the orthopedist about your continued problems with your right hip

## 2013-10-08 NOTE — Progress Notes (Signed)
Subjective:    Patient ID: Jacob Cline, male    DOB: [birth year], 53 y.o.   MRN: [id number]  HPI Pt here for follow up and management of chronic medical problems. The patient does complain of some right hip pain and also has a couple of places on his chin and nose that he would like checked. He is also due for a PSA and prostate today. He will return to the office for the lab work. He is to check with his insurance regarding the Prevnar vaccine.         Patient Active Problem List   Diagnosis Date Noted  . Porokeratosis 08/07/2013  . Pain in lower limb 08/07/2013  . Onychomycosis 08/07/2013  . Chest discomfort 05/28/2013  . Hypertension 01/30/2013  . Hyperlipidemia 01/30/2013  . Hiatal hernia with gastroesophageal reflux 01/30/2013  . BPH (benign prostatic hyperplasia) 01/30/2013  . Anxiety state, unspecified, history of 01/30/2013  . Low back pain 01/30/2013  . Erectile dysfunction 01/30/2013   Outpatient Encounter Prescriptions as of 10/08/2013  Medication Sig  . aspirin 81 MG chewable tablet Chew 81 mg by mouth daily.    . cholecalciferol (VITAMIN D) 1000 UNITS tablet Take 1,000 Units by mouth daily. 3 tablets daily  . Cinnamon 500 MG capsule 500 mg.  . diclofenac (VOLTAREN) 75 MG EC tablet Take 1 tablet (75 mg total) by mouth 2 (two) times daily.  . Multiple Vitamin (MULTIVITAMIN) tablet 1 tablet.  . Omega-3 Fatty Acids (FISH OIL) 1000 MG CAPS 1 capsule.  [name] omeprazole (PRILOSEC) 20 MG capsule TAKE ONE CAPSULE BY MOUTH ONCE DAILY  . triamterene-hydrochlorothiazide (MAXZIDE-25) 37.5-25 MG per tablet TAKE ONE TABLET BY MOUTH ONCE DAILY  . vardenafil (LEVITRA) 20 MG tablet Take 20 mg by mouth daily.      Review of Systems  Constitutional: Negative.   HENT: Negative.   Eyes: Negative.   Respiratory: Negative.   Cardiovascular: Negative.   Gastrointestinal: Negative.   Endocrine: Negative.   Genitourinary: Negative.   Musculoskeletal: Positive for arthralgias (right  hip pain- xray done).  Skin: Negative.        Check skin under chin Check place on nose   Allergic/Immunologic: Negative.   Neurological: Negative.   Hematological: Negative.   Psychiatric/Behavioral: Negative.        Objective:   Physical Exam  Nursing note and vitals reviewed. Constitutional: He is oriented to person, place, and time. He appears well-developed and well-nourished.  Patient is overweight  HENT:  Head: Normocephalic and atraumatic.  Right Ear: External ear normal.  Left Ear: External ear normal.  Nose: Nose normal.  Mouth/Throat: Oropharynx is clear and moist. No oropharyngeal exudate.  Eyes: Conjunctivae and EOM are normal. Pupils are equal, round, and reactive to light. Right eye exhibits no discharge. Left eye exhibits no discharge. No scleral icterus.  Neck: Normal range of motion. Neck supple. No thyromegaly present.  No thyroid abnormality palpated  Cardiovascular: Normal rate, regular rhythm, normal heart sounds and intact distal pulses.  Exam reveals no gallop and no friction rub.   No murmur heard. At 72 per minute  Pulmonary/Chest: Effort normal and breath sounds normal. No respiratory distress. He has no wheezes. He has no rales. He exhibits no tenderness.  Abdominal: Soft. Bowel sounds are normal. He exhibits no mass. There is no tenderness. There is no rebound and no guarding.  Obesity  Genitourinary: Rectum normal and penis normal.  The prostate gland was enlarged and smooth. There were no rectal masses.  There is no inguinal hernia palpated there were no inguinal nodes palpated the external genitalia were within normal limits.  Musculoskeletal: Normal range of motion. He exhibits no edema and no tenderness.  There is hip discomfort with certain movements specifically in the right hip. His recent x-rays were reviewed with him and he was given a copy of the report indicating wear and tear arthritis and spurring of the femoral head and some sclerosis in  the acetabulum  Lymphadenopathy:    He has no cervical adenopathy.  Neurological: He is alert and oriented to person, place, and time. He has normal reflexes. No cranial nerve deficit.  Skin: Skin is warm and dry. No rash noted. No erythema. No pallor.  There continues to be a discoloration of the bridge of the nose. The patient is concerned about this.  Psychiatric: He has a normal mood and affect. His behavior is normal. Judgment and thought content normal.   BP 137/87  Pulse 62  Temp(Src) 98.3 F (36.8 C) (Oral)  Ht 5' 11"  (1.803 m)  Wt 224 lb (101.606 kg)  BMI 31.26 kg/m2        Assessment & Plan:  1. BPH (benign prostatic hyperplasia) - POCT CBC; Future - PSA, total and free; Future - POCT UA - Microscopic Only - POCT urinalysis dipstick  2. Hyperlipidemia - POCT CBC; Future - NMR, lipoprofile; Future  3. Hypertension - POCT CBC; Future - BMP8+EGFR; Future - Hepatic function panel; Future  4. Vitamin D deficiency - Vit D  25 hydroxy (rtn osteoporosis monitoring); Future  5. Skin lesion - Ambulatory referral to Dermatology  6. Right hip pain - Ambulatory referral to Orthopedic Surgery  7. Erectile dysfunction   No orders of the defined types were placed in this encounter.   Patient Instructions   Continue current medications. Continue good therapeutic lifestyle changes which include good diet and exercise. Fall precautions discussed with patient. If an FOBT was given today- please return it to our front desk. If you are over 68 years old - you may need Prevnar 78 or the adult Pneumonia vaccine.  Please check with her insurance regarding the Prevnar vaccine We will arrange an appointment to see the dermatologist about your nose and the orthopedist about your continued problems with your right hip   Arrie Senate MD

## 2013-10-23 ENCOUNTER — Other Ambulatory Visit (INDEPENDENT_AMBULATORY_CARE_PROVIDER_SITE_OTHER): Payer: Managed Care, Other (non HMO)

## 2013-10-23 DIAGNOSIS — R7309 Other abnormal glucose: Secondary | ICD-10-CM

## 2013-10-23 DIAGNOSIS — R7989 Other specified abnormal findings of blood chemistry: Secondary | ICD-10-CM

## 2013-10-23 DIAGNOSIS — E559 Vitamin D deficiency, unspecified: Secondary | ICD-10-CM

## 2013-10-23 DIAGNOSIS — E785 Hyperlipidemia, unspecified: Secondary | ICD-10-CM

## 2013-10-23 DIAGNOSIS — I10 Essential (primary) hypertension: Secondary | ICD-10-CM

## 2013-10-24 LAB — NMR, LIPOPROFILE
CHOLESTEROL: 183 mg/dL (ref 100–199)
HDL Cholesterol by NMR: 45 mg/dL (ref >39)
HDL PARTICLE NUMBER: 36.8 umol/L (ref >=30.5)
LDL PARTICLE NUMBER: 1308 nmol/L — AB (ref <1000)
LDL Size: 20.8 nm (ref >20.5)
LDLC SERPL CALC-MCNC: 111 mg/dL — AB (ref 0–99)
LP-IR Score: 67 — ABNORMAL HIGH (ref <=45)
SMALL LDL PARTICLE NUMBER: 587 nmol/L — AB (ref <=527)
Triglycerides by NMR: 137 mg/dL (ref 0–149)

## 2013-10-24 LAB — HEPATIC FUNCTION PANEL
ALT: 44 IU/L (ref 0–44)
AST: 25 IU/L (ref 0–40)
Albumin: 4.5 g/dL (ref 3.5–5.5)
Alkaline Phosphatase: 92 IU/L (ref 39–117)
Bilirubin, Direct: 0.13 mg/dL (ref 0.00–0.40)
Total Bilirubin: 0.5 mg/dL (ref 0.0–1.2)
Total Protein: 6.6 g/dL (ref 6.0–8.5)

## 2013-10-24 LAB — VITAMIN D 25 HYDROXY (VIT D DEFICIENCY, FRACTURES): VIT D 25 HYDROXY: 38.3 ng/mL (ref 30.0–100.0)

## 2013-10-24 LAB — BMP8+EGFR
BUN/Creatinine Ratio: 25 — ABNORMAL HIGH (ref 9–20)
BUN: 22 mg/dL (ref 6–24)
CALCIUM: 9 mg/dL (ref 8.7–10.2)
CHLORIDE: 102 mmol/L (ref 97–108)
CO2: 24 mmol/L (ref 18–29)
Creatinine, Ser: 0.88 mg/dL (ref 0.76–1.27)
GFR calc Af Amer: 112 mL/min/{1.73_m2} (ref >59)
GFR, EST NON AFRICAN AMERICAN: 97 mL/min/{1.73_m2} (ref >59)
GLUCOSE: 99 mg/dL (ref 65–99)
POTASSIUM: 4.6 mmol/L (ref 3.5–5.2)
SODIUM: 142 mmol/L (ref 134–144)

## 2013-10-25 ENCOUNTER — Telehealth: Payer: Self-pay | Admitting: Family Medicine

## 2013-10-25 LAB — POCT GLYCOSYLATED HEMOGLOBIN (HGB A1C): Hemoglobin A1C: 5.7

## 2013-10-25 NOTE — Addendum Note (Signed)
Addended by: Prescott GumLAND, JESSICA M on: 10/25/2013 08:50 AM   Modules accepted: Orders

## 2013-10-25 NOTE — Telephone Encounter (Signed)
Message copied by Doreatha MassedMOORE, MITZI on Fri Oct 25, 2013 10:53 AM ------      Message from: Ernestina PennaMOORE, DONALD W      Created: Thu Oct 24, 2013  7:15 AM       The blood sugar, creatinine, and electrolytes including potassium are within normal limit      All liver function tests are within normal limits      With advanced lipid testing, the total LDL Particle number remains elevated at 1308. Previously it was 1445. The LDL C. is elevated at 111. The triglycerides are within normal limits at 137 and improved from the past.------ please start Crestor10 mg daily, give a written prescription for Crestor 10 mg #90, one at bedtime for cholesterol also give him a coupon that will reduce his cost to $3 for 3 months. The patient should have his liver function tests checked in 4-6 weeks. He does not have to be fasting. If he develops muscle aches or myalgias beyond his normal he should discontinue the medication. He should continue aggressive therapeutic lifestyle changes which include diet exercise and weight loss.      The vitamin D level is 38.3. The gallbladder this is between 50 and 60.----- he should increase his vitamin D3 from 1000 daily 2 2000 daily. Finish the 1000s by taking 2 of them a day, then purchase the 2000 capsule and take one daily--- I preferred the in [employer], they have this at [facility] and the [facility] in [place] ------

## 2013-10-28 MED ORDER — ROSUVASTATIN CALCIUM 10 MG PO TABS: 10.0000 mg | ORAL_TABLET | Freq: Every day | ORAL | Status: DC

## 2013-10-28 NOTE — Addendum Note (Signed)
Addended by: Magdalene RiverBULLINS, JAMIE H on: 10/28/2013 08:18 AM   Modules accepted: Orders

## 2013-11-12 ENCOUNTER — Other Ambulatory Visit: Payer: Self-pay | Admitting: Family Medicine

## 2013-11-19 ENCOUNTER — Telehealth: Payer: Self-pay | Admitting: Family Medicine

## 2013-11-19 DIAGNOSIS — E785 Hyperlipidemia, unspecified: Secondary | ICD-10-CM

## 2013-11-19 MED ORDER — PRAVASTATIN SODIUM 40 MG PO TABS: 40.0000 mg | ORAL_TABLET | Freq: Every day | ORAL | Status: DC

## 2013-11-19 NOTE — Telephone Encounter (Signed)
Patient decided that he would [place]. Needs an appointment as late as possible.  He originally requested Dr. Charlann Boxerlin but he was to speak with their office and he hasn't. I will forward this to the referral department for scheduling.

## 2013-11-19 NOTE — Telephone Encounter (Signed)
Crestor is too expensive even with savings card. Can you prescribe a generic?

## 2013-11-19 NOTE — Telephone Encounter (Signed)
Med sent to pharmacy and lab ordered. Patient aware.

## 2013-11-19 NOTE — Telephone Encounter (Signed)
Start pravastatin 40 mg #30 one daily at bedtime recheck liver function tests in 4 weeks, patient does not have to be fasting.

## 2013-12-11 ENCOUNTER — Other Ambulatory Visit: Payer: Self-pay | Admitting: Family Medicine

## 2013-12-13 ENCOUNTER — Telehealth: Payer: Self-pay | Admitting: Family Medicine

## 2014-02-10 ENCOUNTER — Other Ambulatory Visit: Payer: Self-pay | Admitting: Family Medicine

## 2014-02-11 ENCOUNTER — Other Ambulatory Visit (INDEPENDENT_AMBULATORY_CARE_PROVIDER_SITE_OTHER): Payer: Managed Care, Other (non HMO)

## 2014-02-11 DIAGNOSIS — E785 Hyperlipidemia, unspecified: Secondary | ICD-10-CM

## 2014-02-12 LAB — HEPATIC FUNCTION PANEL
ALBUMIN: 4.8 g/dL (ref 3.5–5.5)
ALT: 66 IU/L — ABNORMAL HIGH (ref 0–44)
AST: 34 IU/L (ref 0–40)
Alkaline Phosphatase: 88 IU/L (ref 39–117)
Bilirubin, Direct: 0.15 mg/dL (ref 0.00–0.40)
Total Bilirubin: 0.5 mg/dL (ref 0.0–1.2)
Total Protein: 7.2 g/dL (ref 6.0–8.5)

## 2014-04-08 ENCOUNTER — Encounter: Payer: Self-pay | Admitting: Family Medicine

## 2014-04-08 ENCOUNTER — Ambulatory Visit (INDEPENDENT_AMBULATORY_CARE_PROVIDER_SITE_OTHER): Payer: Managed Care, Other (non HMO) | Admitting: Family Medicine

## 2014-04-08 VITALS — BP 140/88 | HR 78 | Temp 99.0°F | Ht 71.0 in | Wt 221.0 lb

## 2014-04-08 DIAGNOSIS — E559 Vitamin D deficiency, unspecified: Secondary | ICD-10-CM

## 2014-04-08 DIAGNOSIS — N400 Enlarged prostate without lower urinary tract symptoms: Secondary | ICD-10-CM

## 2014-04-08 DIAGNOSIS — E785 Hyperlipidemia, unspecified: Secondary | ICD-10-CM

## 2014-04-08 DIAGNOSIS — J069 Acute upper respiratory infection, unspecified: Secondary | ICD-10-CM

## 2014-04-08 DIAGNOSIS — I10 Essential (primary) hypertension: Secondary | ICD-10-CM

## 2014-04-08 DIAGNOSIS — N5201 Erectile dysfunction due to arterial insufficiency: Secondary | ICD-10-CM

## 2014-04-08 MED ORDER — AMOXICILLIN 500 MG PO CAPS: 500.0000 mg | ORAL_CAPSULE | Freq: Three times a day (TID) | ORAL | Status: DC

## 2014-04-08 NOTE — Patient Instructions (Addendum)
Continue current medications. Continue good therapeutic lifestyle changes which include good diet and exercise. Fall precautions discussed with patient. If an FOBT was given today- please return it to our front desk. If you are over 55 years old - you may need Prevnar 13 or the adult Pneumonia vaccine.  Flu Shots will be available at our office starting mid- September. Please call and schedule a FLU CLINIC APPOINTMENT.   Take Cialis 5 mg as directed Take antibiotic until completed Use saline nose spray regularly and drink plenty of fluids Use Mucinex maximum strength, plain, blue and white in color, 1 twice daily as needed for cough and congestion--- take this with a large glass of water

## 2014-04-08 NOTE — Progress Notes (Signed)
 Subjective:    Patient ID: Jacob Cline, male    DOB: 11/18/1960, 53 y.o.   MRN: [id number]  HPI Pt here for follow up and management of chronic medical problems. Recent complaints of a cough and head congestion for about 4 days.        Patient Active Problem List   Diagnosis Date Noted  . Porokeratosis 08/07/2013  . Pain in lower limb 08/07/2013  . Onychomycosis 08/07/2013  . Chest discomfort 05/28/2013  . Hypertension 01/30/2013  . Hyperlipidemia 01/30/2013  . Hiatal hernia with gastroesophageal reflux 01/30/2013  . BPH (benign prostatic hyperplasia) 01/30/2013  . Anxiety state, unspecified, history of 01/30/2013  . Low back pain 01/30/2013  . Erectile dysfunction 01/30/2013   Outpatient Encounter Prescriptions as of 04/08/2014  Medication Sig  . aspirin 81 MG chewable tablet Chew 81 mg by mouth daily.    . cholecalciferol (VITAMIN D) 1000 UNITS tablet Take 1,000 Units by mouth daily. 3 tablets daily  . Cinnamon 500 MG capsule 500 mg.  . diclofenac (VOLTAREN) 75 MG EC tablet TAKE ONE TABLET BY MOUTH TWICE DAILY  . Multiple Vitamin (MULTIVITAMIN) tablet 1 tablet.  . Omega-3 Fatty Acids (FISH OIL) 1000 MG CAPS 1 capsule.  . omeprazole (PRILOSEC) 20 MG capsule TAKE ONE CAPSULE BY MOUTH ONCE DAILY  . pravastatin (PRAVACHOL) 40 MG tablet TAKE ONE TABLET BY MOUTH AT BEDTIME  . triamterene-hydrochlorothiazide (MAXZIDE-25) 37.5-25 MG per tablet TAKE ONE TABLET BY MOUTH ONCE DAILY  . vardenafil (LEVITRA) 20 MG tablet Take 20 mg by mouth daily.    . [DISCONTINUED] rosuvastatin (CRESTOR) 10 MG tablet Take 1 tablet (10 mg total) by mouth daily.    Review of Systems  Constitutional: Negative.   HENT: Positive for congestion.   Eyes: Negative.   Respiratory: Positive for cough (x 4 days).   Cardiovascular: Negative.   Gastrointestinal: Negative.   Endocrine: Negative.   Genitourinary: Negative.   Musculoskeletal: Negative.   Skin: Negative.   Allergic/Immunologic:  Negative.   Neurological: Negative.   Hematological: Negative.   Psychiatric/Behavioral: Negative.        Objective:   Physical Exam  Constitutional: He is oriented to person, place, and time. He appears well-developed and well-nourished. No distress.  HENT:  Head: Normocephalic and atraumatic.  Right Ear: External ear normal.  Left Ear: External ear normal.  Mouth/Throat: Oropharynx is clear and moist. No oropharyngeal exudate.  The throat was slightly red posteriorly. Nasal congestion bilaterally with no sinus pressure tenderness  Eyes: Conjunctivae and EOM are normal. Pupils are equal, round, and reactive to light. Right eye exhibits no discharge. Left eye exhibits no discharge. No scleral icterus.  Neck: Normal range of motion. Neck supple. No thyromegaly present.  No anterior cervical nodes  Cardiovascular: Normal rate, regular rhythm, normal heart sounds and intact distal pulses.  Exam reveals no friction rub.   No murmur heard. Pulmonary/Chest: Effort normal and breath sounds normal. No respiratory distress. He has no wheezes. He has no rales. He exhibits no tenderness.  Raspy cough  Abdominal: Soft. Bowel sounds are normal. He exhibits no mass. There is no tenderness. There is no rebound and no guarding.  Obesity without tenderness  Musculoskeletal: Normal range of motion. He exhibits no edema.  Lymphadenopathy:    He has no cervical adenopathy.  Neurological: He is alert and oriented to person, place, and time. He has normal reflexes. No cranial nerve deficit.  Skin: Skin is warm and dry. No rash noted. No erythema. No   pallor.  Psychiatric: He has a normal mood and affect. His behavior is normal. Judgment and thought content normal.  Nursing note and vitals reviewed.  BP 140/88 mmHg  Pulse 78  Temp(Src) 99 F (37.2 C) (Oral)  Ht 5' 11" (1.803 m)  Wt 221 lb (100.245 kg)  BMI 30.84 kg/m2        Assessment & Plan:  1. Hyperlipidemia - POCT CBC; Future - NMR,  lipoprofile; Future  2. Essential hypertension - POCT CBC; Future - BMP8+EGFR; Future - Hepatic function panel; Future  3. BPH (benign prostatic hyperplasia) - POCT CBC; Future  4. Vitamin D deficiency - POCT CBC; Future - Vit D  25 hydroxy (rtn osteoporosis monitoring); Future  5. URI (upper respiratory infection) - amoxicillin (AMOXIL) 500 MG capsule; Take 1 capsule (500 mg total) by mouth 3 (three) times daily.  Dispense: 30 capsule; Refill: 0  6. Erectile dysfunction due to arterial insufficiency -Samples of Cialis given for patient  Patient Instructions  Continue current medications. Continue good therapeutic lifestyle changes which include good diet and exercise. Fall precautions discussed with patient. If an FOBT was given today- please return it to our front desk. If you are over 50 years old - you may need Prevnar 13 or the adult Pneumonia vaccine.  Flu Shots will be available at our office starting mid- September. Please call and schedule a FLU CLINIC APPOINTMENT.   Take Cialis 5 mg as directed Take antibiotic until completed Use saline nose spray regularly and drink plenty of fluids Use Mucinex maximum strength, plain, blue and white in color, 1 twice daily as needed for cough and congestion--- take this with a large glass of water   Don W. Moore MD   

## 2014-04-09 ENCOUNTER — Other Ambulatory Visit: Payer: Self-pay | Admitting: Family Medicine

## 2014-04-29 ENCOUNTER — Other Ambulatory Visit (INDEPENDENT_AMBULATORY_CARE_PROVIDER_SITE_OTHER): Payer: Managed Care, Other (non HMO)

## 2014-04-29 DIAGNOSIS — N400 Enlarged prostate without lower urinary tract symptoms: Secondary | ICD-10-CM

## 2014-04-29 DIAGNOSIS — I10 Essential (primary) hypertension: Secondary | ICD-10-CM

## 2014-04-29 DIAGNOSIS — E785 Hyperlipidemia, unspecified: Secondary | ICD-10-CM

## 2014-04-29 DIAGNOSIS — E559 Vitamin D deficiency, unspecified: Secondary | ICD-10-CM

## 2014-04-29 LAB — POCT CBC
Granulocyte percent: 67.3 %G (ref 37–80)
HEMATOCRIT: 52 % (ref 43.5–53.7)
HEMOGLOBIN: 17 g/dL (ref 14.1–18.1)
Lymph, poc: 1.9 (ref 0.6–3.4)
MCH: 29.9 pg (ref 27–31.2)
MCHC: 32.7 g/dL (ref 31.8–35.4)
MCV: 91.4 fL (ref 80–97)
MPV: 6.6 fL (ref 0–99.8)
POC Granulocyte: 4.8 (ref 2–6.9)
POC LYMPH %: 26.8 % (ref 10–50)
Platelet Count, POC: 196 10*3/uL (ref 142–424)
RBC: 5.7 M/uL (ref 4.69–6.13)
RDW, POC: 12.6 %
WBC: 7.1 10*3/uL (ref 4.6–10.2)

## 2014-04-29 NOTE — Progress Notes (Signed)
Lab only 

## 2014-04-30 LAB — NMR, LIPOPROFILE
Cholesterol: 137 mg/dL (ref 100–199)
HDL Cholesterol by NMR: 43 mg/dL (ref >39)
HDL Particle Number: 36.8 umol/L (ref >=30.5)
LDL Particle Number: 941 nmol/L (ref <1000)
LDL Size: 20.7 nm (ref >20.5)
LDL-C: 72 mg/dL (ref 0–99)
LP-IR Score: 65 — ABNORMAL HIGH (ref <=45)
SMALL LDL PARTICLE NUMBER: 488 nmol/L (ref <=527)
TRIGLYCERIDES BY NMR: 109 mg/dL (ref 0–149)

## 2014-04-30 LAB — BMP8+EGFR
BUN/Creatinine Ratio: 22 — ABNORMAL HIGH (ref 9–20)
BUN: 19 mg/dL (ref 6–24)
CALCIUM: 8.9 mg/dL (ref 8.7–10.2)
CO2: 28 mmol/L (ref 18–29)
CREATININE: 0.86 mg/dL (ref 0.76–1.27)
Chloride: 101 mmol/L (ref 97–108)
GFR, EST AFRICAN AMERICAN: 113 mL/min/{1.73_m2} (ref >59)
GFR, EST NON AFRICAN AMERICAN: 98 mL/min/{1.73_m2} (ref >59)
Glucose: 88 mg/dL (ref 65–99)
Potassium: 4.4 mmol/L (ref 3.5–5.2)
SODIUM: 139 mmol/L (ref 134–144)

## 2014-04-30 LAB — HEPATIC FUNCTION PANEL
ALK PHOS: 85 IU/L (ref 39–117)
ALT: 46 IU/L — AB (ref 0–44)
AST: 26 IU/L (ref 0–40)
Albumin: 4.1 g/dL (ref 3.5–5.5)
BILIRUBIN DIRECT: 0.12 mg/dL (ref 0.00–0.40)
BILIRUBIN TOTAL: 0.5 mg/dL (ref 0.0–1.2)
Total Protein: 6.3 g/dL (ref 6.0–8.5)

## 2014-04-30 LAB — VITAMIN D 25 HYDROXY (VIT D DEFICIENCY, FRACTURES): Vit D, 25-Hydroxy: 48.7 ng/mL (ref 30.0–100.0)

## 2014-05-15 ENCOUNTER — Other Ambulatory Visit: Payer: Self-pay | Admitting: Family Medicine

## 2014-05-27 ENCOUNTER — Ambulatory Visit (INDEPENDENT_AMBULATORY_CARE_PROVIDER_SITE_OTHER): Payer: Managed Care, Other (non HMO) | Admitting: Family Medicine

## 2014-05-27 ENCOUNTER — Encounter: Payer: Self-pay | Admitting: Family Medicine

## 2014-05-27 VITALS — BP 150/89 | HR 62 | Temp 97.0°F | Ht 71.0 in | Wt 229.0 lb

## 2014-05-27 DIAGNOSIS — S46211A Strain of muscle, fascia and tendon of other parts of biceps, right arm, initial encounter: Secondary | ICD-10-CM

## 2014-05-27 MED ORDER — HYDROCODONE-ACETAMINOPHEN 5-325 MG PO TABS: 1.0000 | ORAL_TABLET | Freq: Four times a day (QID) | ORAL | Status: DC | PRN

## 2014-05-27 NOTE — Progress Notes (Signed)
   Subjective:    Patient ID: Jacqulyn CaneJames H Harbeson, male    DOB: 10/04/1960, 54 y.o.   MRN: [id number]  HPI Patient is here for c/o torn muscle in his right biceps a week ago.  Review of Systems  Constitutional: Negative for fever.  HENT: Negative for ear pain.   Eyes: Negative for discharge.  Respiratory: Negative for cough.   Cardiovascular: Negative for chest pain.  Gastrointestinal: Negative for abdominal distention.  Endocrine: Negative for polyuria.  Genitourinary: Negative for difficulty urinating.  Musculoskeletal: Negative for gait problem and neck pain.  Skin: Negative for color change and rash.  Neurological: Negative for speech difficulty and headaches.  Psychiatric/Behavioral: Negative for agitation.           BP 150/89 mmHg  Pulse 62  Temp(Src) 97 F (36.1 C) (Oral)  Ht 5\' 11"  (1.803 m)  Wt 229 lb (103.874 kg)  BMI 31.95 kg/m2 Physical Exam  Right bicep tenderness and ecchymosis      Assessment & Plan:     ICD-9-CM ICD-10-CM   1. Biceps rupture, distal, right, initial encounter 841.8 S46.211A HYDROcodone-acetaminophen (NORCO) 5-325 MG per tablet   Continue voltaren  Return if symptoms worsen or fail to improve.  Deatra CanterWilliam J Torey Reinard FNP

## 2014-07-07 ENCOUNTER — Telehealth: Payer: Self-pay | Admitting: Family Medicine

## 2014-07-07 NOTE — Telephone Encounter (Signed)
He can take acetaminophen with diclofenac. He should be rechecked if he is continuing to have pain to find out what the cause of the pain is.

## 2014-07-07 NOTE — Telephone Encounter (Signed)
Patient states that he is using the diclofenac daily but he is still having a lot pain and wants to know if you approve for him to take acetaminophen with this.

## 2014-07-31 ENCOUNTER — Other Ambulatory Visit: Payer: Self-pay | Admitting: Family Medicine

## 2014-08-21 ENCOUNTER — Other Ambulatory Visit: Payer: Self-pay | Admitting: Family Medicine

## 2014-09-23 ENCOUNTER — Encounter: Payer: Self-pay | Admitting: Internal Medicine

## 2014-10-13 ENCOUNTER — Other Ambulatory Visit: Payer: Self-pay | Admitting: Family Medicine

## 2014-10-20 ENCOUNTER — Other Ambulatory Visit: Payer: Self-pay | Admitting: Family Medicine

## 2014-10-21 ENCOUNTER — Encounter: Payer: Self-pay | Admitting: *Deleted

## 2014-10-21 ENCOUNTER — Ambulatory Visit (INDEPENDENT_AMBULATORY_CARE_PROVIDER_SITE_OTHER): Payer: Managed Care, Other (non HMO) | Admitting: Family Medicine

## 2014-10-21 ENCOUNTER — Encounter: Payer: Self-pay | Admitting: Family Medicine

## 2014-10-21 VITALS — BP 126/82 | HR 84 | Temp 97.6°F | Ht 71.0 in | Wt 224.0 lb

## 2014-10-21 DIAGNOSIS — N400 Enlarged prostate without lower urinary tract symptoms: Secondary | ICD-10-CM | POA: Diagnosis not present

## 2014-10-21 DIAGNOSIS — E785 Hyperlipidemia, unspecified: Secondary | ICD-10-CM | POA: Diagnosis not present

## 2014-10-21 DIAGNOSIS — Z0000 Encounter for general adult medical examination without abnormal findings: Secondary | ICD-10-CM

## 2014-10-21 DIAGNOSIS — E559 Vitamin D deficiency, unspecified: Secondary | ICD-10-CM

## 2014-10-21 DIAGNOSIS — I10 Essential (primary) hypertension: Secondary | ICD-10-CM | POA: Diagnosis not present

## 2014-10-21 DIAGNOSIS — M25551 Pain in right hip: Secondary | ICD-10-CM

## 2014-10-21 LAB — POCT UA - MICROSCOPIC ONLY
BACTERIA, U MICROSCOPIC: NEGATIVE
CRYSTALS, UR, HPF, POC: NEGATIVE
Casts, Ur, LPF, POC: NEGATIVE
WBC, UR, HPF, POC: NEGATIVE
YEAST UA: NEGATIVE

## 2014-10-21 LAB — POCT URINALYSIS DIPSTICK
Bilirubin, UA: NEGATIVE
Glucose, UA: NEGATIVE
KETONES UA: NEGATIVE
Leukocytes, UA: NEGATIVE
Nitrite, UA: NEGATIVE
PH UA: 5
UROBILINOGEN UA: NEGATIVE

## 2014-10-21 NOTE — Patient Instructions (Addendum)
Continue current medications. Continue good therapeutic lifestyle changes which include good diet and exercise. Fall precautions discussed with patient. If an FOBT was given today- please return it to our front desk. If you are over 56 years old - you may need Prevnar 13 or the adult Pneumonia vaccine.  Flu Shots are still available at our office. If you still haven't had one please call to set up a nurse visit to get one.   After your visit with [place] today you will receive a survey in the mail or online from [employer] regarding your care with [place]. Please take a moment to fill this out. Your feedback is very important to [place] as you can help [place] better understand your patient needs as well as improve your experience and satisfaction. WE CARE ABOUT YOU!!!   The patient should follow-up with his orthopedist as planned

## 2014-10-21 NOTE — Progress Notes (Signed)
Subjective:    Patient ID: Jacob Cline, male    DOB: [birth year], 54 y.o.   MRN: [id number]  HPI Patient is here today for annual wellness exam and follow up of chronic medical problems which includes hypertension and hyperlipidemia. He is taking medications regularly. The patient's biggest complaint today is some pain in his right hip and he has plans to have hip surgery soon. He was noted to have degenerative changes in both hips as early as over 1 year ago. He denies chest pain, shortness of breath, problems with his GI tract including nausea vomiting diarrhea blood in the stool. He also denies any problems with voiding.      Patient Active Problem List   Diagnosis Date Noted  . Porokeratosis 08/07/2013  . Pain in lower limb 08/07/2013  . Onychomycosis 08/07/2013  . Chest discomfort 05/28/2013  . Hypertension 01/30/2013  . Hyperlipidemia 01/30/2013  . Hiatal hernia with gastroesophageal reflux 01/30/2013  . BPH (benign prostatic hyperplasia) 01/30/2013  . Anxiety state, unspecified, history of 01/30/2013  . Low back pain 01/30/2013  . Erectile dysfunction 01/30/2013   Outpatient Encounter Prescriptions as of 10/21/2014  Medication Sig  . aspirin 81 MG chewable tablet Chew 81 mg by mouth daily.    . cholecalciferol (VITAMIN D) 1000 UNITS tablet Take 1,000 Units by mouth daily. 3 tablets daily  . Cinnamon 500 MG capsule 500 mg.  . diclofenac (VOLTAREN) 75 MG EC tablet TAKE ONE TABLET BY MOUTH TWICE DAILY  . HYDROcodone-acetaminophen (NORCO) 5-325 MG per tablet Take 1 tablet by mouth every 6 (six) hours as needed for moderate pain.  . Multiple Vitamin (MULTIVITAMIN) tablet 1 tablet.  . Omega-3 Fatty Acids (FISH OIL) 1000 MG CAPS 1 capsule.  [name] omeprazole (PRILOSEC) 20 MG capsule TAKE ONE CAPSULE BY MOUTH ONCE DAILY  . pravastatin (PRAVACHOL) 40 MG tablet TAKE ONE TABLET BY MOUTH AT BEDTIME  . triamterene-hydrochlorothiazide (MAXZIDE-25) 37.5-25 MG per tablet TAKE ONE TABLET BY  MOUTH ONCE DAILY  . vardenafil (LEVITRA) 20 MG tablet Take 20 mg by mouth daily.    . [DISCONTINUED] diclofenac (VOLTAREN) 75 MG EC tablet TAKE ONE TABLET BY MOUTH TWICE DAILY   No facility-administered encounter medications on file as of 10/21/2014.      Review of Systems  Constitutional: Negative.   HENT: Negative.   Eyes: Negative.   Respiratory: Negative.   Cardiovascular: Negative.   Gastrointestinal: Negative.   Endocrine: Negative.   Genitourinary: Negative.   Musculoskeletal: Positive for arthralgias (right hip replacement soon ).  Skin: Negative.   Allergic/Immunologic: Negative.   Neurological: Negative.   Hematological: Negative.   Psychiatric/Behavioral: Negative.        Objective:   Physical Exam  Constitutional: He is oriented to person, place, and time. He appears well-developed and well-nourished. No distress.  HENT:  Head: Normocephalic and atraumatic.  Right Ear: External ear normal.  Left Ear: External ear normal.  Mouth/Throat: Oropharynx is clear and moist. No oropharyngeal exudate.  Minimal nasal congestion bilaterally  Eyes: Conjunctivae and EOM are normal. Pupils are equal, round, and reactive to light. Right eye exhibits no discharge. Left eye exhibits no discharge. No scleral icterus.  Neck: Normal range of motion. Neck supple. No thyromegaly present.  Cardiovascular: Normal rate, regular rhythm, normal heart sounds and intact distal pulses.  Exam reveals no gallop and no friction rub.   No murmur heard. Heart is regular at 72/m without murmur.  Pulmonary/Chest: Effort normal and breath sounds normal. No respiratory distress.  He has no wheezes. He has no rales. He exhibits no tenderness.  Chest clear anteriorly and posteriorly  Abdominal: Soft. Bowel sounds are normal. He exhibits no mass. There is no tenderness. There is no rebound and no guarding.  The abdomen is obese without masses or tenderness  Genitourinary: Rectum normal and penis normal.    The prostate is enlarged but soft without masses. There are no rectal masses. There is no inguinal hernia or inguinal nodes and the external genitalia were within normal limits.  Musculoskeletal: Normal range of motion. He exhibits no edema or tenderness.  Some discomfort in right hip and right knee  Lymphadenopathy:    He has no cervical adenopathy.  Neurological: He is alert and oriented to person, place, and time. He has normal reflexes. No cranial nerve deficit.  Skin: Skin is warm and dry. No rash noted. No erythema. No pallor.  Psychiatric: He has a normal mood and affect. His behavior is normal. Judgment and thought content normal.  Nursing note and vitals reviewed.  BP 126/82 mmHg  Pulse 84  Temp(Src) 97.6 F (36.4 C) (Oral)  Ht  (1.803 m)  Wt 224 lb (101.606 kg)  BMI 31.26 kg/m2  EKG: Questions old anterior septal MI but most likely within normal limits, patient had myocardial perfusion imaging in early 2015 which was normal. We will discuss the EKG results with the cardiologist and make sure that he does not need a cardiac clearance as he is having no symptoms with his heart.       Assessment & Plan:  1. Hyperlipidemia -The patient will return to the office for fasting lab work and until that time and results are back he should continue with pravastatin. - EKG 12-Lead  2. Essential hypertension -Home blood pressure readings brought in for review were elevated in the 140-150 range however the reading in this office was good at 126/82. The patient will bring his cuff back for comparison and we will get another reading with our machine and get one with his machine to make sure that his is correct - EKG 12-Lead  3. BPH (benign prostatic hyperplasia) -The prostate is enlarged but soft and smooth he is having no symptoms with this.  4. Vitamin D deficiency -He should continue with his current vitamin D treatment pending results of lab work  5. Annual physical  exam -Continue to monitor blood pressure readings and bring these in for review along with bring in your cuff in for review -Get a colonoscopy this fall -Return the FOBT -Check with your insurance regarding the Prevnar vaccine -Continue to watch sodium intake and work on getting the weight down as much as possible  6. Right hip pain -Follow-up with orthopedist as planned  Patient Instructions  Continue current medications. Continue good therapeutic lifestyle changes which include good diet and exercise. Fall precautions discussed with patient. If an FOBT was given today- please return it to our front desk. If you are over 68 years old - you may need Prevnar 13 or the adult Pneumonia vaccine.  Flu Shots are still available at our office. If you still haven't had one please call to set up a nurse visit to get one.   After your visit with [place] today you will receive a survey in the mail or online from [employer] regarding your care with [place]. Please take a moment to fill this out. Your feedback is very important to [place] as you can help [place] better understand your patient  needs as well as improve your experience and satisfaction. WE CARE ABOUT YOU!!!   The patient should follow-up with his orthopedist as planned   Nyra Capes MD

## 2014-10-21 NOTE — Progress Notes (Signed)
Please review this patient's chart and see if you think he needs cardiac clearance. He is scheduled for right hip surgery on July, 12, 2016. He seen you in 2014 and 2015 and we did a EKG in our office today.    Thank you. You can let me know the recommendations if you would like and i can call the pt. Thanks, Berton Lan, LPN

## 2014-10-22 ENCOUNTER — Other Ambulatory Visit: Payer: Self-pay | Admitting: *Deleted

## 2014-10-22 ENCOUNTER — Telehealth: Payer: Self-pay | Admitting: Nurse Practitioner

## 2014-10-22 DIAGNOSIS — Z01818 Encounter for other preprocedural examination: Secondary | ICD-10-CM

## 2014-10-22 NOTE — Progress Notes (Signed)
I have not seen this patient in over a year so I cannot make the call as to whether or not he needs cardiac clearance prior to ortho surgery . He has had a negative work up in the past and the recent office visit with Dr. Christell Constant did not suggest any active cardiac issues.  From what I have reviewed, I do not see any reason that he needs to be seen but the final decision will be up to Dr. Christell Constant

## 2014-10-22 NOTE — Telephone Encounter (Signed)
Called patient to schedule appointment for surgical clearance per request from Dr. Christell Constant after consultation with Dr. Elease Hashimoto.

## 2014-10-22 NOTE — Progress Notes (Signed)
I would go ahead and arrange a cardiac clearance in light of the changes on the EKG

## 2014-10-24 ENCOUNTER — Encounter: Payer: Self-pay | Admitting: Podiatry

## 2014-10-24 ENCOUNTER — Ambulatory Visit (INDEPENDENT_AMBULATORY_CARE_PROVIDER_SITE_OTHER): Payer: Managed Care, Other (non HMO) | Admitting: Podiatry

## 2014-10-24 VITALS — BP 149/96 | HR 70 | Ht 71.0 in | Wt 224.0 lb

## 2014-10-24 DIAGNOSIS — B351 Tinea unguium: Secondary | ICD-10-CM | POA: Diagnosis not present

## 2014-10-24 DIAGNOSIS — M7741 Metatarsalgia, right foot: Secondary | ICD-10-CM

## 2014-10-24 DIAGNOSIS — Q828 Other specified congenital malformations of skin: Secondary | ICD-10-CM | POA: Diagnosis not present

## 2014-10-24 DIAGNOSIS — M7742 Metatarsalgia, left foot: Secondary | ICD-10-CM | POA: Diagnosis not present

## 2014-10-24 DIAGNOSIS — M79673 Pain in unspecified foot: Secondary | ICD-10-CM

## 2014-10-24 DIAGNOSIS — M79606 Pain in leg, unspecified: Secondary | ICD-10-CM

## 2014-10-24 NOTE — Patient Instructions (Signed)
Seen for hypertrophic nails. All nails debrided. May benefit from custom orthotics. Return in 3 months or as needed.

## 2014-10-24 NOTE — Progress Notes (Signed)
Subjective: 54 year old male presents complaining of painful callus under balls and toe nails. 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 He will be going through with right hip surgery in July.   Objective: Dermatologic: Porokeratotic lesions at center ball and 5th MPJ left foot. Pinch calluses under the first MPJ bilateral. Thick dystrophic nails on both great toes.  Vascular: All pedal pulses are palpable.  Orthopedic: Rectus foot without gross deformities. Neurologic: Neurovascular status are within normal.   Assessment: Lesser metatarsalgia bilateral. Painful porokeratosis under 2nd and 3rd MPJ left and sub 5 bilateral. Pinch calluses both great toes. Onychomycosis both great toes.  Plan: All lesions and abnormal nails debrided. May benefit from custom orthotics to alleviate pain on lesser metatarsal area.  Return as needed.

## 2014-10-25 ENCOUNTER — Other Ambulatory Visit (INDEPENDENT_AMBULATORY_CARE_PROVIDER_SITE_OTHER): Payer: Managed Care, Other (non HMO)

## 2014-10-25 DIAGNOSIS — M25551 Pain in right hip: Secondary | ICD-10-CM | POA: Diagnosis not present

## 2014-10-25 DIAGNOSIS — I10 Essential (primary) hypertension: Secondary | ICD-10-CM | POA: Diagnosis not present

## 2014-10-25 DIAGNOSIS — E559 Vitamin D deficiency, unspecified: Secondary | ICD-10-CM | POA: Diagnosis not present

## 2014-10-25 DIAGNOSIS — E785 Hyperlipidemia, unspecified: Secondary | ICD-10-CM | POA: Diagnosis not present

## 2014-10-25 DIAGNOSIS — N400 Enlarged prostate without lower urinary tract symptoms: Secondary | ICD-10-CM | POA: Diagnosis not present

## 2014-10-25 DIAGNOSIS — Z0000 Encounter for general adult medical examination without abnormal findings: Secondary | ICD-10-CM | POA: Diagnosis not present

## 2014-10-25 LAB — POCT CBC
Granulocyte percent: 68.8 %G (ref 37–80)
HCT, POC: 49.6 % (ref 43.5–53.7)
Hemoglobin: 16.2 g/dL (ref 14.1–18.1)
LYMPH, POC: 1.6 (ref 0.6–3.4)
MCH: 30.4 pg (ref 27–31.2)
MCHC: 32.7 g/dL (ref 31.8–35.4)
MCV: 92.9 fL (ref 80–97)
MPV: 7.4 fL (ref 0–99.8)
PLATELET COUNT, POC: 202 10*3/uL (ref 142–424)
POC Granulocyte: 4.4 (ref 2–6.9)
POC LYMPH %: 25.7 % (ref 10–50)
RBC: 5.34 M/uL (ref 4.69–6.13)
RDW, POC: 12.6 %
WBC: 6.4 10*3/uL (ref 4.6–10.2)

## 2014-10-27 LAB — NMR, LIPOPROFILE
Cholesterol: 173 mg/dL (ref 100–199)
HDL Cholesterol by NMR: 44 mg/dL (ref >39)
HDL PARTICLE NUMBER: 30 umol/L — AB (ref >=30.5)
LDL PARTICLE NUMBER: 1278 nmol/L — AB (ref <1000)
LDL SIZE: 20.5 nm (ref >20.5)
LDL-C: 103 mg/dL — ABNORMAL HIGH (ref 0–99)
LP-IR SCORE: 50 — AB (ref <=45)
Small LDL Particle Number: 744 nmol/L — ABNORMAL HIGH (ref <=527)
TRIGLYCERIDES BY NMR: 132 mg/dL (ref 0–149)

## 2014-10-27 LAB — VITAMIN D 25 HYDROXY (VIT D DEFICIENCY, FRACTURES): Vit D, 25-Hydroxy: 45.7 ng/mL (ref 30.0–100.0)

## 2014-10-27 LAB — BMP8+EGFR
BUN / CREAT RATIO: 37 — AB (ref 9–20)
BUN: 27 mg/dL — ABNORMAL HIGH (ref 6–24)
CALCIUM: 9.3 mg/dL (ref 8.7–10.2)
CO2: 24 mmol/L (ref 18–29)
Chloride: 101 mmol/L (ref 97–108)
Creatinine, Ser: 0.73 mg/dL — ABNORMAL LOW (ref 0.76–1.27)
GFR calc Af Amer: 120 mL/min/{1.73_m2} (ref >59)
GFR, EST NON AFRICAN AMERICAN: 104 mL/min/{1.73_m2} (ref >59)
Glucose: 94 mg/dL (ref 65–99)
Potassium: 4.2 mmol/L (ref 3.5–5.2)
SODIUM: 141 mmol/L (ref 134–144)

## 2014-10-27 LAB — HEPATIC FUNCTION PANEL
ALBUMIN: 4.5 g/dL (ref 3.5–5.5)
ALK PHOS: 90 IU/L (ref 39–117)
ALT: 48 IU/L — AB (ref 0–44)
AST: 25 IU/L (ref 0–40)
BILIRUBIN TOTAL: 0.5 mg/dL (ref 0.0–1.2)
BILIRUBIN, DIRECT: 0.14 mg/dL (ref 0.00–0.40)
Total Protein: 6.5 g/dL (ref 6.0–8.5)

## 2014-10-27 LAB — PSA, TOTAL AND FREE
PROSTATE SPECIFIC AG, SERUM: 0.7 ng/mL (ref 0.0–4.0)
PSA FREE: 0.14 ng/mL
PSA, Free Pct: 20 %

## 2014-10-28 DIAGNOSIS — M1611 Unilateral primary osteoarthritis, right hip: Secondary | ICD-10-CM | POA: Insufficient documentation

## 2014-10-28 DIAGNOSIS — M169 Osteoarthritis of hip, unspecified: Secondary | ICD-10-CM

## 2014-10-29 ENCOUNTER — Other Ambulatory Visit: Payer: Managed Care, Other (non HMO)

## 2014-10-29 DIAGNOSIS — R300 Dysuria: Secondary | ICD-10-CM

## 2014-10-29 DIAGNOSIS — R319 Hematuria, unspecified: Secondary | ICD-10-CM

## 2014-10-30 LAB — URINE CULTURE

## 2014-10-31 NOTE — Addendum Note (Signed)
Addended by: Gwenith Daily on: 10/31/2014 09:41 AM   Modules accepted: Orders

## 2014-11-03 ENCOUNTER — Ambulatory Visit (INDEPENDENT_AMBULATORY_CARE_PROVIDER_SITE_OTHER): Payer: Managed Care, Other (non HMO) | Admitting: Cardiovascular Disease

## 2014-11-03 ENCOUNTER — Encounter: Payer: Self-pay | Admitting: Cardiovascular Disease

## 2014-11-03 VITALS — BP 126/68 | HR 73 | Ht 71.0 in | Wt 222.0 lb

## 2014-11-03 DIAGNOSIS — I10 Essential (primary) hypertension: Secondary | ICD-10-CM | POA: Diagnosis not present

## 2014-11-03 DIAGNOSIS — R9431 Abnormal electrocardiogram [ECG] [EKG]: Secondary | ICD-10-CM

## 2014-11-03 DIAGNOSIS — N400 Enlarged prostate without lower urinary tract symptoms: Secondary | ICD-10-CM

## 2014-11-03 NOTE — Progress Notes (Signed)
Jacob Cline Date of Birth  [birth year]       [facility]    [place] [facility] [address]  [address] [place]  [address]   [place], [place]  [id number] [phone]     [phone]   Fax  [phone]    Fax [phone]  Problem List: 1. Chest pain:  seen in the emergency room 2. Hypertension 3. Hyperlipidemia   History of Present Illness:  I have seen Jacob Cline in the past for CP and abnormal ECG.  We have performed a cath in the past which was normal.   He also has had a stress myoview which was normal.    Jacob Cline had an episode of chest heaviness about a month ago.  Also associated with a head ache.  Was seen in the ER, work up was negative.   He has had some occasional recurent chest pain,.  Dull , shooting pain, comes and goes.   Also seems to have chest heaviness that comes on at random times ( not associated with any specific activity) which Seems to be relived with antiacids.  Has not seen his medical doctor.    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    He does not get any regular exercise.   He has taken Lovastatin in the past but stopped taking it due to  muscle aches.    Sep 10, 2013:  Jacob Cline was last seen in January, 2015. He had a stress Myoview study for recurrent episodes of chest discomfort.  The Myoview showed no evidence of ischemia. His left ventricular systolic function was normal with an EF of 57%.  Still having some CP.   He works 14 hour days ( 2 AM to 4 PM)   so has limited time to exercise.  Tries not to eat any fried foods by sometimes does not have a good option.   November 03, 2014:  Jacob Cline is doing well.  No CP.  Able to do all of his normal activities without any limitation .   Was seen by Dr. Christell ConstantMoore for pre-op visit and his ECG was found to be abnormal .    he's had T-wave inversions in the anterolateral leads for the past several years. He had a Myoview study last year which was normal.  He had no evidence of ischemia. He's done very well and has not had any cardiac symptoms.       Current Outpatient Prescriptions on File Prior to Visit  Medication Sig Dispense Refill  . aspirin 81 MG chewable tablet Chew 81 mg by mouth daily.      . cholecalciferol (VITAMIN D) 1000 UNITS tablet Take 1,000 Units by mouth daily. 3 tablets daily    . Cinnamon 500 MG capsule 500 mg.    . diclofenac (VOLTAREN) 75 MG EC tablet TAKE ONE TABLET BY MOUTH TWICE DAILY 60 tablet 1  . Multiple Vitamin (MULTIVITAMIN) tablet 1 tablet.    . Omega-3 Fatty Acids (FISH OIL) 1000 MG CAPS 1 capsule.    [name]. omeprazole (PRILOSEC) 20 MG capsule TAKE ONE CAPSULE BY MOUTH ONCE DAILY 90 capsule 0  . pravastatin (PRAVACHOL) 40 MG tablet TAKE ONE TABLET BY MOUTH AT BEDTIME 30 tablet 5  . triamterene-hydrochlorothiazide (MAXZIDE-25) 37.5-25 MG per tablet TAKE ONE TABLET BY MOUTH ONCE DAILY 90 tablet 0  . vardenafil (LEVITRA) 20 MG tablet Take 20 mg by mouth daily.  No current facility-administered medications on file prior to visit.    Allergies  Allergen Reactions  . Codeine     Unknown   . Lovastatin     Makes me hurt alot  . Niaspan [Niacin Er]     Make me hurt alot    Past Medical History  Diagnosis Date  . Hypertension   . Obesity   . Hyperlipidemia   . Hiatal hernia   . Hemorrhoids   . BPH (benign prostatic hypertrophy)     Past Surgical History  Procedure Laterality Date  . Athroscopy of rt knee  02/02  . Henia - umbilical surgery  07/17/07  . Vasectomy    . Mouth surgery      History  Smoking status  . Never Smoker   Smokeless tobacco  . Never Used    History  Alcohol Use No    Family History  Problem Relation Age of Onset  . Colon cancer      family history   . Prostate cancer      family history   . Diabetes Mother     Reviw of Systems:  Reviewed in the HPI.  All other systems are negative.  Physical Exam: Blood pressure 126/68, pulse 73, height  (1.803  m), weight 100.699 kg (222 lb). General: Well developed, well nourished, in no acute distress.  Head: Normocephalic, atraumatic, sclera non-icteric, mucus membranes are moist,   Neck: Supple. Carotids are 2 + without bruits. No JVD   Lungs: Clear   Heart: RR, normal S1 and S2.  Abdomen: Soft, non-tender, non-distended with normal bowel sounds.  Msk:  Strength and tone are normal   Extremities: No clubbing or cyanosis. No edema.  Distal pedal pulses are 2+ and equal    Neuro: CN II - XII intact.  Alert and oriented X 3.   Psych:  Normal   ECG: Dec. 12, 2014:  NSR TWI in the ant. lat  Jan. 20, 2015:  The TWI have improved.   November 03, 2014:  NSR at 73.  Minimal criteria for LVH.  NS T wave abn. No significant changes from previous tracing   Assessment / Plan:    1. Chest pain:  seen in the emergency room.  He has TWI in the anterior lateral leads which is old.  myoview study in the past was unremarkable   2. Hypertension - he still eats extra salt in his food.   Have cautioned him about this   3. Hyperlipidemia 4. Hip pain :   Angie needs  to have hip surgery. He's not had any further episodes of chest discomfort. He had a Myoview study last year that was normal. Has T-wave inversions in the anterior lateral leads but this is a chronic problem for him. He will be at low risk for his hip surgery. We will fax this note to the following numbers.   [phone]    Bess Saltzman, Deloris Ping, MD  11/03/2014 5:27 PM    [facility] [address] [place],  [address] [place], [place]  [address] Pager [phone] Phone: [phone]; Fax: [phone]   [facility]  [address] [place], [place]  [address] [phone]    Fax [phone]

## 2014-11-03 NOTE — Patient Instructions (Signed)
Medication Instructions:  Your physician recommends that you continue on your current medications as directed. Please refer to the Current Medication list given to you today.   Labwork: None Ordered   Testing/Procedures: None Ordered   Follow-Up: Your physician wants you to follow-up in: 1 year with Dr. Nahser.  You will receive a reminder letter in the mail two months in advance. If you don't receive a letter, please call our office to schedule the follow-up appointment.      

## 2014-11-05 ENCOUNTER — Other Ambulatory Visit: Payer: Self-pay | Admitting: Family Medicine

## 2014-11-05 ENCOUNTER — Other Ambulatory Visit (INDEPENDENT_AMBULATORY_CARE_PROVIDER_SITE_OTHER): Payer: Managed Care, Other (non HMO)

## 2014-11-05 DIAGNOSIS — R319 Hematuria, unspecified: Secondary | ICD-10-CM | POA: Diagnosis not present

## 2014-11-05 LAB — POCT URINALYSIS DIPSTICK
BILIRUBIN UA: NEGATIVE
Blood, UA: NEGATIVE
GLUCOSE UA: NEGATIVE
KETONES UA: NEGATIVE
LEUKOCYTES UA: NEGATIVE
NITRITE UA: NEGATIVE
PH UA: 6
Spec Grav, UA: 1.02
Urobilinogen, UA: NEGATIVE

## 2014-11-05 LAB — POCT UA - MICROSCOPIC ONLY
CASTS, UR, LPF, POC: NEGATIVE
Crystals, Ur, HPF, POC: NEGATIVE
Epithelial cells, urine per micros: NEGATIVE
RBC, urine, microscopic: NEGATIVE
WBC, Ur, HPF, POC: NEGATIVE
YEAST UA: NEGATIVE

## 2014-11-05 NOTE — Progress Notes (Signed)
Lab only 

## 2014-11-06 ENCOUNTER — Other Ambulatory Visit: Payer: Self-pay

## 2014-11-06 MED ORDER — OMEPRAZOLE 20 MG PO CPDR: 20.0000 mg | DELAYED_RELEASE_CAPSULE | Freq: Every day | ORAL | Status: DC

## 2014-11-07 ENCOUNTER — Other Ambulatory Visit: Payer: Managed Care, Other (non HMO)

## 2014-11-07 DIAGNOSIS — Z1212 Encounter for screening for malignant neoplasm of rectum: Secondary | ICD-10-CM

## 2014-11-07 HISTORY — PX: HIP SURGERY: SHX245

## 2014-11-07 NOTE — Progress Notes (Signed)
Lab only 

## 2014-11-09 LAB — FECAL OCCULT BLOOD, IMMUNOCHEMICAL: FECAL OCCULT BLD: POSITIVE — AB

## 2014-11-12 ENCOUNTER — Telehealth: Payer: Self-pay | Admitting: Family Medicine

## 2014-11-12 DIAGNOSIS — R195 Other fecal abnormalities: Secondary | ICD-10-CM

## 2014-11-12 NOTE — Telephone Encounter (Signed)
lmtcb

## 2014-11-13 ENCOUNTER — Other Ambulatory Visit (INDEPENDENT_AMBULATORY_CARE_PROVIDER_SITE_OTHER): Payer: Managed Care, Other (non HMO)

## 2014-11-13 DIAGNOSIS — R195 Other fecal abnormalities: Secondary | ICD-10-CM | POA: Diagnosis not present

## 2014-11-13 LAB — POCT CBC
GRANULOCYTE PERCENT: 69.9 % (ref 37–80)
HCT, POC: 51 % (ref 43.5–53.7)
HEMOGLOBIN: 17.3 g/dL (ref 14.1–18.1)
LYMPH, POC: 2.1 (ref 0.6–3.4)
MCH, POC: 30.7 pg (ref 27–31.2)
MCHC: 34 g/dL (ref 31.8–35.4)
MCV: 90.1 fL (ref 80–97)
MPV: 7.6 fL (ref 0–99.8)
POC GRANULOCYTE: 5.5 (ref 2–6.9)
POC LYMPH PERCENT: 26.8 %L (ref 10–50)
Platelet Count, POC: 200 10*3/uL (ref 142–424)
RBC: 5.66 M/uL (ref 4.69–6.13)
RDW, POC: 12.6 %
WBC: 7.8 10*3/uL (ref 4.6–10.2)

## 2014-11-13 NOTE — Progress Notes (Signed)
Lab only 

## 2014-11-14 ENCOUNTER — Other Ambulatory Visit: Payer: Managed Care, Other (non HMO)

## 2014-11-14 DIAGNOSIS — Z1212 Encounter for screening for malignant neoplasm of rectum: Secondary | ICD-10-CM

## 2014-11-14 NOTE — Progress Notes (Signed)
Lab only 

## 2014-11-16 LAB — FECAL OCCULT BLOOD, IMMUNOCHEMICAL: FECAL OCCULT BLD: NEGATIVE

## 2014-11-18 ENCOUNTER — Telehealth: Payer: Self-pay | Admitting: *Deleted

## 2014-11-18 NOTE — Progress Notes (Signed)
Patient aware and does have colonoscopy planned

## 2014-11-18 NOTE — Telephone Encounter (Signed)
-----   Message from Ernestina Pennaonald W Moore, MD sent at 11/16/2014  9:52 AM EDT ----- Please make sure that this patient has his colonoscopy planned in this FOBT is -9 days ago it was positive. Recent CBC was stable with the hemoglobin

## 2014-11-20 ENCOUNTER — Telehealth: Payer: Self-pay | Admitting: *Deleted

## 2014-11-20 NOTE — Telephone Encounter (Signed)
Patient aware of FOBT results.  He will schedule colonoscopy around end of year.  He has just had surgery.

## 2015-01-05 ENCOUNTER — Other Ambulatory Visit: Payer: Self-pay | Admitting: Family Medicine

## 2015-01-09 ENCOUNTER — Encounter: Payer: Self-pay | Admitting: Podiatry

## 2015-01-09 ENCOUNTER — Ambulatory Visit (INDEPENDENT_AMBULATORY_CARE_PROVIDER_SITE_OTHER): Payer: Managed Care, Other (non HMO) | Admitting: Podiatry

## 2015-01-09 ENCOUNTER — Ambulatory Visit: Payer: Managed Care, Other (non HMO) | Admitting: Podiatry

## 2015-01-09 VITALS — BP 150/72 | HR 70

## 2015-01-09 DIAGNOSIS — B351 Tinea unguium: Secondary | ICD-10-CM

## 2015-01-09 DIAGNOSIS — M79606 Pain in leg, unspecified: Secondary | ICD-10-CM

## 2015-01-09 DIAGNOSIS — M7740 Metatarsalgia, unspecified foot: Secondary | ICD-10-CM

## 2015-01-09 DIAGNOSIS — M216X9 Other acquired deformities of unspecified foot: Secondary | ICD-10-CM

## 2015-01-09 DIAGNOSIS — M79673 Pain in unspecified foot: Secondary | ICD-10-CM

## 2015-01-09 DIAGNOSIS — Q828 Other specified congenital malformations of skin: Secondary | ICD-10-CM | POA: Diagnosis not present

## 2015-01-09 DIAGNOSIS — M21969 Unspecified acquired deformity of unspecified lower leg: Secondary | ICD-10-CM

## 2015-01-09 DIAGNOSIS — M7742 Metatarsalgia, left foot: Principal | ICD-10-CM

## 2015-01-09 DIAGNOSIS — M7741 Metatarsalgia, right foot: Secondary | ICD-10-CM

## 2015-01-09 NOTE — Progress Notes (Signed)
Subjective: 54 year old male presents complaining of painful callus under balls and toe nails. 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 Recently he had done hip surgery. Stated that the surgery caused his right lower limb be a bit longer than the left.   Objective: Dermatologic: Porokeratotic lesions at center ball and 5th MPJ left foot. Pinch calluses under the first MPJ bilateral. Thick dystrophic nails on both great toes.  Vascular: All pedal pulses are palpable.  Orthopedic: Rectus foot without gross deformities. Neurologic: Neurovascular status are within normal.  Radiographic examination reveal Cavus type foot, short first ray bilateral. No other gross deformities noted.   Assessment: Lesser metatarsalgia bilateral. Painful porokeratosis under 2nd and 3rd MPJ left and sub 5 bilateral. Pinch calluses both great toes. Onychomycosis both great toes. Short first ray bilateral. Short left lower limb.   Plan: All lesions and abnormal nails debrided. Casted for orthotics. Modification noted; raise left side 1/4", add metatarsal pad bilateral, add Morton's extension bilateral.  Will contact when they are ready.

## 2015-01-09 NOTE — Patient Instructions (Signed)
Debrided calluses, toe nails and casted for orthotics.

## 2015-01-15 ENCOUNTER — Other Ambulatory Visit: Payer: Self-pay | Admitting: Family Medicine

## 2015-03-03 ENCOUNTER — Telehealth: Payer: Self-pay | Admitting: Family Medicine

## 2015-03-03 NOTE — Telephone Encounter (Signed)
Patient informed that we normally use [facility], anyone in the group would be fine.  Patient is going to call and schedule himself so he will be able to work around his schedule

## 2015-03-06 ENCOUNTER — Encounter: Payer: Self-pay | Admitting: Internal Medicine

## 2015-03-06 ENCOUNTER — Other Ambulatory Visit: Payer: Self-pay | Admitting: Family Medicine

## 2015-03-17 ENCOUNTER — Encounter: Payer: Self-pay | Admitting: Podiatry

## 2015-03-17 ENCOUNTER — Ambulatory Visit (INDEPENDENT_AMBULATORY_CARE_PROVIDER_SITE_OTHER): Payer: Managed Care, Other (non HMO) | Admitting: Podiatry

## 2015-03-17 VITALS — BP 145/94 | HR 73

## 2015-03-17 DIAGNOSIS — Q828 Other specified congenital malformations of skin: Secondary | ICD-10-CM | POA: Diagnosis not present

## 2015-03-17 DIAGNOSIS — B351 Tinea unguium: Secondary | ICD-10-CM

## 2015-03-17 NOTE — Patient Instructions (Signed)
Orthotic check. Doing well with orthotics. Has painful callus under ball of left foot, possible due to foreign body induced.  Nails and calluses debrided. Return in 2 weeks.

## 2015-03-17 NOTE — Progress Notes (Signed)
Subjective: 54 year old male presents complaining of painful callus under ball of left foot and toe nails. He is doing ok with custom orthotics since last month.  Objective: Dermatologic: Porokeratotic lesions at center ball and 5th MPJ left foot. Pinch calluses under the first MPJ bilateral. Thick dystrophic nails on both great toes.  Vascular: All pedal pulses are palpable.  Orthopedic: Rectus foot without gross deformities. Neurologic: Neurovascular status are within normal.   Assessment: Painful porokeratosis under 3rd MPJ left. Onychomycosis x 10 .  Plan: All lesions and abnormal nails debrided. Return in 3 months or sooner if left foot lesion becomes painful.

## 2015-04-08 ENCOUNTER — Other Ambulatory Visit: Payer: Self-pay | Admitting: Family Medicine

## 2015-04-09 NOTE — Telephone Encounter (Signed)
Last seen 10/21/14  DWM 

## 2015-04-14 ENCOUNTER — Ambulatory Visit (AMBULATORY_SURGERY_CENTER): Payer: Self-pay

## 2015-04-14 ENCOUNTER — Other Ambulatory Visit: Payer: Self-pay | Admitting: Family Medicine

## 2015-04-14 VITALS — Ht 70.0 in | Wt 233.8 lb

## 2015-04-14 DIAGNOSIS — Z1211 Encounter for screening for malignant neoplasm of colon: Secondary | ICD-10-CM

## 2015-04-14 MED ORDER — NA SULFATE-K SULFATE-MG SULF 17.5-3.13-1.6 GM/177ML PO SOLN: ORAL | Status: DC

## 2015-04-14 NOTE — Progress Notes (Signed)
Per pt, no allergies to soy or egg products.Pt not taking any weight loss meds or using  O2 at home. 

## 2015-04-23 ENCOUNTER — Ambulatory Visit (INDEPENDENT_AMBULATORY_CARE_PROVIDER_SITE_OTHER): Payer: Managed Care, Other (non HMO)

## 2015-04-23 ENCOUNTER — Ambulatory Visit (INDEPENDENT_AMBULATORY_CARE_PROVIDER_SITE_OTHER): Payer: Managed Care, Other (non HMO) | Admitting: Family Medicine

## 2015-04-23 ENCOUNTER — Encounter: Payer: Self-pay | Admitting: Family Medicine

## 2015-04-23 VITALS — BP 128/83 | HR 67 | Temp 97.0°F | Ht 70.0 in | Wt 230.0 lb

## 2015-04-23 DIAGNOSIS — R638 Other symptoms and signs concerning food and fluid intake: Secondary | ICD-10-CM | POA: Diagnosis not present

## 2015-04-23 DIAGNOSIS — I10 Essential (primary) hypertension: Secondary | ICD-10-CM

## 2015-04-23 DIAGNOSIS — L209 Atopic dermatitis, unspecified: Secondary | ICD-10-CM | POA: Diagnosis not present

## 2015-04-23 DIAGNOSIS — E785 Hyperlipidemia, unspecified: Secondary | ICD-10-CM | POA: Diagnosis not present

## 2015-04-23 DIAGNOSIS — E559 Vitamin D deficiency, unspecified: Secondary | ICD-10-CM | POA: Diagnosis not present

## 2015-04-23 DIAGNOSIS — N400 Enlarged prostate without lower urinary tract symptoms: Secondary | ICD-10-CM | POA: Diagnosis not present

## 2015-04-23 MED ORDER — TRIAMTERENE-HCTZ 37.5-25 MG PO TABS: 1.0000 | ORAL_TABLET | Freq: Every day | ORAL | Status: DC

## 2015-04-23 MED ORDER — OMEPRAZOLE 20 MG PO CPDR
20.0000 mg | DELAYED_RELEASE_CAPSULE | Freq: Every day | ORAL | Status: DC
Start: 2015-04-23 — End: 2015-09-01

## 2015-04-23 MED ORDER — PRAVASTATIN SODIUM 40 MG PO TABS: 40.0000 mg | ORAL_TABLET | Freq: Every day | ORAL | Status: DC

## 2015-04-23 MED ORDER — DICLOFENAC SODIUM 75 MG PO TBEC: 75.0000 mg | DELAYED_RELEASE_TABLET | Freq: Two times a day (BID) | ORAL | Status: DC

## 2015-04-23 NOTE — Patient Instructions (Addendum)
Continue current medications. Continue good therapeutic lifestyle changes which include good diet and exercise. Fall precautions discussed with patient. If an FOBT was given today- please return it to our front desk. If you are over 56 years old - you may need Prevnar 13 or the adult Pneumonia vaccine.  **Flu shots are available--- please call and schedule a FLU-CLINIC appointment**  After your visit with us today you will receive a survey in the mail or online from [employer] regarding your care with us. Please take a moment to fill this out. Your feedback is very important to us as you can help us better understand your patient needs as well as improve your experience and satisfaction. WE CARE ABOUT YOU!!!   The patient should try a small amount of betaMethasone cream to the affected area as directed for 1 week after taking a bath every night. If the area remains irritated and does not heal he should show this to the dermatologist when he makes his next visit there. We will call him with the results of the chest x-ray in the lab work once these results become available If he continues to have problems with the left hip he should arrange a visit with his orthopedist to evaluate that to see if this needs to be replaced also Follow-up with gastroenterologist as planned for colonoscopy Try to reduce the intake of NSAIDs as much as possible as this may be playing a role with your blood pressure Avoid soaps or fabric softeners and detergents that have fragrance Continue to work on weight loss and drink less drinks and drink more water Try to get more exercise even though that is difficult with the schedule that you have

## 2015-04-23 NOTE — Progress Notes (Signed)
Subjective:    Patient ID: Jacob Cline, male    DOB: [birth year], 54 y.o.   MRN: [id number]  HPI Pt here for follow up and management of chronic medical problems which includes hyperlipidemia and hypertension. He is taking medications regularly. The patient is pleasant and in good spirits. He had his right hip replaced in July and did great with this. He is back to work after couple of months of therapy and recovery. He is now having trouble with his left hip some [place] the penis has suggested he may need to have this replaced in the future. He is currently taking diclofenac for this. He has not tried anything milder over-the-counter like ibuprofen. He also indicates his blood pressure seems to be up and down and I told him that this could be related to taking anti-inflammatory medicine and he seems to understand this. He will try to take more Tylenol unless NSAID. He understands that he needs to get his weight down more and he will try to drink more water and less carbonated beverages. He denies chest pain shortness of breath trouble swallowing heartburn indigestion nausea vomiting diarrhea or blood in the stool. He is due for his routine colonoscopy and he plans to get that on December 30 of this year. He is passing his water without problems.      Patient Active Problem List   Diagnosis Date Noted  . Degenerative arthritis of hip 10/28/2014  . Metatarsalgia of both feet 10/24/2014  . Porokeratosis 08/07/2013  . Pain in lower limb 08/07/2013  . Onychomycosis 08/07/2013  . Chest discomfort 05/28/2013  . Hypertension 01/30/2013  . Hyperlipidemia 01/30/2013  . Hiatal hernia with gastroesophageal reflux 01/30/2013  . BPH (benign prostatic hyperplasia) 01/30/2013  . Anxiety state, unspecified, history of 01/30/2013  . Low back pain 01/30/2013  . Erectile dysfunction 01/30/2013   Outpatient Encounter Prescriptions as of 04/23/2015  Medication Sig  . aspirin 81 MG chewable tablet Chew 81  mg by mouth daily.    . cholecalciferol (VITAMIN D) 1000 UNITS tablet Take 1,000 Units by mouth daily. 3 tablets daily  . Cinnamon 500 MG capsule 500 mg.  . diclofenac (VOLTAREN) 75 MG EC tablet TAKE ONE TABLET BY MOUTH TWICE DAILY  . Multiple Vitamin (MULTIVITAMIN) tablet 1 tablet.  . Omega-3 Fatty Acids (FISH OIL) 1000 MG CAPS 1 capsule.  [name] omeprazole (PRILOSEC) 20 MG capsule Take 1 capsule (20 mg total) by mouth daily.  . pravastatin (PRAVACHOL) 40 MG tablet TAKE ONE TABLET BY MOUTH AT BEDTIME  . triamterene-hydrochlorothiazide (MAXZIDE-25) 37.5-25 MG tablet TAKE ONE TABLET BY MOUTH ONCE DAILY  . TURMERIC PO Take by mouth daily.  . Na Sulfate-K Sulfate-Mg Sulf (SUPREP BOWEL PREP) SOLN Suprep as directed /no substitutions (Patient not taking: Reported on 04/23/2015)  . vardenafil (LEVITRA) 20 MG tablet Take 20 mg by mouth daily.     No facility-administered encounter medications on file as of 04/23/2015.     Review of Systems  Constitutional: Negative.   HENT: Negative.   Eyes: Negative.   Respiratory: Negative.   Cardiovascular: Negative.   Gastrointestinal: Negative.   Endocrine: Negative.   Musculoskeletal: Negative.   Skin: Negative.   Allergic/Immunologic: Negative.   Neurological: Negative.   Hematological: Negative.   Psychiatric/Behavioral: Negative.        Objective:   Physical Exam  Constitutional: He is oriented to person, place, and time. He appears well-developed and well-nourished. No distress.  HENT:  Head: Normocephalic and atraumatic.  Right  Ear: External ear normal.  Left Ear: External ear normal.  Nose: Nose normal.  Mouth/Throat: Oropharynx is clear and moist. No oropharyngeal exudate.  Eyes: Conjunctivae and EOM are normal. Pupils are equal, round, and reactive to light. Right eye exhibits no discharge. Left eye exhibits no discharge. No scleral icterus.  Neck: Normal range of motion. Neck supple. No thyromegaly present.  No thyromegaly bruits or  anterior cervical adenopathy  Cardiovascular: Normal rate, regular rhythm, normal heart sounds and intact distal pulses.  Exam reveals no gallop and no friction rub.   No murmur heard. At 72/m with regular rate and rhythm  Pulmonary/Chest: Effort normal and breath sounds normal. No respiratory distress. He has no wheezes. He has no rales.  Lungs are clear anteriorly and posteriorly there is no axillary adenopathy  Abdominal: Soft. Bowel sounds are normal. He exhibits no mass. There is no tenderness. There is no rebound and no guarding.  Musculoskeletal: Normal range of motion. He exhibits no edema or tenderness.  There is some tenderness at the left lateral hip joint. The right hip is well-healed and he has good mobility  Lymphadenopathy:    He has no cervical adenopathy.  Neurological: He is alert and oriented to person, place, and time. He has normal reflexes. No cranial nerve deficit.  Skin: Skin is warm and dry. Rash noted.  The patient has a small irritated skin lesion that is not red but slightly scaly on the left side of the penis and this is much improved with using cortisone 10. He is not sure if he had a bite when this first started bothering him. It is better.  Psychiatric: He has a normal mood and affect. His behavior is normal. Judgment and thought content normal.  Nursing note and vitals reviewed.   BP 128/83 mmHg  Pulse 67  Temp(Src) 97 F (36.1 C) (Oral)  Ht _0  (1.778 m)  Wt 230 lb (104.327 kg)  BMI 33.00 kg/m2  [facility] reading (PRIMARY) by  Dr.Moore-chest x-ray-   no active disease                                    Assessment & Plan:  1. Hyperlipidemia -The patient should continue with aggressive therapeutic lifestyle changes which include diet and exercise and his current statin treatment. He should drink more water and less drinks and make a sincere effort of hitting his weight down further. - CBC with Differential/Platelet; Future - NMR, lipoprofile;  Future  2. Essential hypertension -The blood pressure is good today and he will continue with current treatment - BMP8+EGFR; Future - CBC with Differential/Platelet; Future - Hepatic function panel; Future - DG Chest 2 View; Future  3. BPH (benign prostatic hyperplasia) -He is having no symptoms with his prostate currently - CBC with Differential/Platelet; Future  4. Vitamin D deficiency -Continue current vitamin D replacement pending results of lab work - CBC with Differential/Platelet; Future - VITAMIN D 25 Hydroxy (Vit-D Deficiency, Fractures); Future  5. Atopic dermatitis -Try the prescription steroid cream a small amount to the affected area in the [place] for 1 week after taking a bath nightly and if this does not resolve the skin irritation he should discuss this with his dermatologist when he sees her at the next visit  6. Increased BMI -The patient's diet and exercise regimen was discussed with him during the visit. He will also try to make more efforts of  drinking more water and less carbonated beverages including those that are sugar-free.  Meds ordered this encounter  Medications  . diclofenac (VOLTAREN) 75 MG EC tablet    Sig: Take 1 tablet (75 mg total) by mouth 2 (two) times daily.    Dispense:  60 tablet    Refill:  11  . omeprazole (PRILOSEC) 20 MG capsule    Sig: Take 1 capsule (20 mg total) by mouth daily.    Dispense:  90 capsule    Refill:  3  . pravastatin (PRAVACHOL) 40 MG tablet    Sig: Take 1 tablet (40 mg total) by mouth at bedtime.    Dispense:  30 tablet    Refill:  11  . triamterene-hydrochlorothiazide (MAXZIDE-25) 37.5-25 MG tablet    Sig: Take 1 tablet by mouth daily.    Dispense:  90 tablet    Refill:  3   Patient Instructions  Continue current medications. Continue good therapeutic lifestyle changes which include good diet and exercise. Fall precautions discussed with patient. If an FOBT was given today- please return it to our front  desk. If you are over 80 years old - you may need Prevnar 5 or the adult Pneumonia vaccine.  **Flu shots are available--- please call and schedule a FLU-CLINIC appointment**  After your visit with [place] today you will receive a survey in the mail or online from [employer] regarding your care with [place]. Please take a moment to fill this out. Your feedback is very important to [place] as you can help [place] better understand your patient needs as well as improve your experience and satisfaction. WE CARE ABOUT YOU!!!   The patient should try a small amount of betaMethasone cream to the affected area as directed for 1 week after taking a bath every night. If the area remains irritated and does not heal he should show this to the dermatologist when he makes his next visit there. We will call him with the results of the chest x-ray in the lab work once these results become available If he continues to have problems with the left hip he should arrange a visit with his orthopedist to evaluate that to see if this needs to be replaced also Follow-up with gastroenterologist as planned for colonoscopy Try to reduce the intake of NSAIDs as much as possible as this may be playing a role with your blood pressure Avoid soaps or fabric softeners and detergents that have fragrance Continue to work on weight loss and drink less drinks and drink more water Try to get more exercise even though that is difficult with the schedule that you have   Arrie Senate MD

## 2015-05-01 ENCOUNTER — Other Ambulatory Visit (INDEPENDENT_AMBULATORY_CARE_PROVIDER_SITE_OTHER): Payer: Managed Care, Other (non HMO)

## 2015-05-01 DIAGNOSIS — N400 Enlarged prostate without lower urinary tract symptoms: Secondary | ICD-10-CM

## 2015-05-01 DIAGNOSIS — I10 Essential (primary) hypertension: Secondary | ICD-10-CM | POA: Diagnosis not present

## 2015-05-01 DIAGNOSIS — E785 Hyperlipidemia, unspecified: Secondary | ICD-10-CM

## 2015-05-01 DIAGNOSIS — E559 Vitamin D deficiency, unspecified: Secondary | ICD-10-CM

## 2015-05-01 NOTE — Progress Notes (Signed)
Lab only 

## 2015-05-02 LAB — VITAMIN D 25 HYDROXY (VIT D DEFICIENCY, FRACTURES): Vit D, 25-Hydroxy: 45.1 ng/mL (ref 30.0–100.0)

## 2015-05-02 LAB — NMR, LIPOPROFILE
Cholesterol: 198 mg/dL (ref 100–199)
HDL Cholesterol by NMR: 49 mg/dL (ref >39)
HDL PARTICLE NUMBER: 37.6 umol/L (ref >=30.5)
LDL Particle Number: 1508 nmol/L — ABNORMAL HIGH (ref <1000)
LDL SIZE: 20.9 nm (ref >20.5)
LDL-C: 129 mg/dL — ABNORMAL HIGH (ref 0–99)
LP-IR Score: 57 — ABNORMAL HIGH (ref <=45)
SMALL LDL PARTICLE NUMBER: 770 nmol/L — AB (ref <=527)
TRIGLYCERIDES BY NMR: 99 mg/dL (ref 0–149)

## 2015-05-02 LAB — BMP8+EGFR
BUN / CREAT RATIO: 23 — AB (ref 9–20)
BUN: 20 mg/dL (ref 6–24)
CHLORIDE: 98 mmol/L (ref 96–106)
CO2: 27 mmol/L (ref 18–29)
Calcium: 9.4 mg/dL (ref 8.7–10.2)
Creatinine, Ser: 0.88 mg/dL (ref 0.76–1.27)
GFR, EST AFRICAN AMERICAN: 111 mL/min/{1.73_m2} (ref >59)
GFR, EST NON AFRICAN AMERICAN: 96 mL/min/{1.73_m2} (ref >59)
Glucose: 95 mg/dL (ref 65–99)
Potassium: 4.3 mmol/L (ref 3.5–5.2)
Sodium: 140 mmol/L (ref 134–144)

## 2015-05-02 LAB — CBC WITH DIFFERENTIAL/PLATELET
BASOS ABS: 0 10*3/uL (ref 0.0–0.2)
Basos: 0 %
EOS (ABSOLUTE): 0.1 10*3/uL (ref 0.0–0.4)
EOS: 2 %
HEMATOCRIT: 48.1 % (ref 37.5–51.0)
HEMOGLOBIN: 16.5 g/dL (ref 12.6–17.7)
IMMATURE GRANS (ABS): 0 10*3/uL (ref 0.0–0.1)
IMMATURE GRANULOCYTES: 0 %
LYMPHS: 26 %
Lymphocytes Absolute: 1.6 10*3/uL (ref 0.7–3.1)
MCH: 30.3 pg (ref 26.6–33.0)
MCHC: 34.3 g/dL (ref 31.5–35.7)
MCV: 88 fL (ref 79–97)
MONOCYTES: 8 %
Monocytes Absolute: 0.5 10*3/uL (ref 0.1–0.9)
NEUTROS PCT: 64 %
Neutrophils Absolute: 3.9 10*3/uL (ref 1.4–7.0)
PLATELETS: 217 10*3/uL (ref 150–379)
RBC: 5.45 x10E6/uL (ref 4.14–5.80)
RDW: 14.6 % (ref 12.3–15.4)
WBC: 6.1 10*3/uL (ref 3.4–10.8)

## 2015-05-02 LAB — HEPATIC FUNCTION PANEL
ALBUMIN: 4.5 g/dL (ref 3.5–5.5)
ALT: 75 IU/L — AB (ref 0–44)
AST: 40 IU/L (ref 0–40)
Alkaline Phosphatase: 98 IU/L (ref 39–117)
BILIRUBIN TOTAL: 0.5 mg/dL (ref 0.0–1.2)
BILIRUBIN, DIRECT: 0.14 mg/dL (ref 0.00–0.40)
Total Protein: 6.8 g/dL (ref 6.0–8.5)

## 2015-05-08 ENCOUNTER — Encounter: Payer: Self-pay | Admitting: Internal Medicine

## 2015-05-08 ENCOUNTER — Ambulatory Visit (AMBULATORY_SURGERY_CENTER): Payer: Managed Care, Other (non HMO) | Admitting: Internal Medicine

## 2015-05-08 VITALS — BP 127/78 | HR 65 | Temp 96.8°F | Resp 17 | Ht 70.0 in | Wt 233.0 lb

## 2015-05-08 DIAGNOSIS — Z1211 Encounter for screening for malignant neoplasm of colon: Secondary | ICD-10-CM | POA: Diagnosis not present

## 2015-05-08 MED ORDER — SODIUM CHLORIDE 0.9 % IV SOLN: 500.0000 mL | INTRAVENOUS | Status: DC

## 2015-05-08 NOTE — Patient Instructions (Signed)
Discharge instructions given. Normal exam. Resume previous medications. YOU HAD AN ENDOSCOPIC PROCEDURE TODAY AT THE [facility]:   Refer to the procedure report that was given to you for any specific questions about what was found during the examination.  If the procedure report does not answer your questions, please call your gastroenterologist to clarify.  If you requested that your care partner not be given the details of your procedure findings, then the procedure report has been included in a sealed envelope for you to review at your convenience later.  YOU SHOULD EXPECT: Some feelings of bloating in the abdomen. Passage of more gas than usual.  Walking can help get rid of the air that was put into your GI tract during the procedure and reduce the bloating. If you had a lower endoscopy (such as a colonoscopy or flexible sigmoidoscopy) you may notice spotting of blood in your stool or on the toilet paper. If you underwent a bowel prep for your procedure, you may not have a normal bowel movement for a few days.  Please Note:  You might notice some irritation and congestion in your nose or some drainage.  This is from the oxygen used during your procedure.  There is no need for concern and it should clear up in a day or so.  SYMPTOMS TO REPORT IMMEDIATELY:   Following lower endoscopy (colonoscopy or flexible sigmoidoscopy):  Excessive amounts of blood in the stool  Significant tenderness or worsening of abdominal pains  Swelling of the abdomen that is new, acute  Fever of 100F or higher   For urgent or emergent issues, a gastroenterologist can be reached at any hour by calling [phone].   DIET: Your first meal following the procedure should be a small meal and then it is ok to progress to your normal diet. Heavy or fried foods are harder to digest and may make you feel nauseous or bloated.  Likewise, meals heavy in dairy and vegetables can increase bloating.  Drink plenty  of fluids but you should avoid alcoholic beverages for 24 hours.  ACTIVITY:  You should plan to take it easy for the rest of today and you should NOT DRIVE or use heavy machinery until tomorrow (because of the sedation medicines used during the test).    FOLLOW UP: Our staff will call the number listed on your records the next business day following your procedure to check on you and address any questions or concerns that you may have regarding the information given to you following your procedure. If we do not reach you, we will leave a message.  However, if you are feeling well and you are not experiencing any problems, there is no need to return our call.  We will assume that you have returned to your regular daily activities without incident.  If any biopsies were taken you will be contacted by phone or by letter within the next 1-3 weeks.  Please call us at [phone] if you have not heard about the biopsies in 3 weeks.    SIGNATURES/CONFIDENTIALITY: You and/or your care partner have signed paperwork which will be entered into your electronic medical record.  These signatures attest to the fact that that the information above on your After Visit Summary has been reviewed and is understood.  Full responsibility of the confidentiality of this discharge information lies with you and/or your care-partner. 

## 2015-05-08 NOTE — Progress Notes (Signed)
Patient awakening,vss,report to rn 

## 2015-05-08 NOTE — Op Note (Signed)
[facility] [address] [place], [id number]   COLONOSCOPY PROCEDURE REPORT  PATIENT: Jacob Cline, Jacob Cline  MR#: [id number] BIRTHDATE: 1960-08-10 , 54  yrs. old GENDER: male ENDOSCOPIST: Beverley FiedlerJay M Aston Lawhorn, MD REFERRED XB:JYNWGNBY:Donald Christell ConstantMoore, M.D. PROCEDURE DATE:  05/08/2015 PROCEDURE:   Colonoscopy, screening First Screening Colonoscopy - Avg.  risk and is 50 yrs.  old or older Yes.  Prior Negative Screening - Now for repeat screening. N/A  History of Adenoma - Now for follow-up colonoscopy & has been > or = to 3 yrs.  N/A  Polyps removed today? No Recommend repeat exam, <10 yrs? No Polyps removed today? No ASA CLASS:   Class II INDICATIONS:Screening for colonic neoplasia, Colorectal Neoplasm Risk Assessment for this procedure is average risk, and normal colonoscopy in 2006 performed by Dr.  Jarold MottoPatterson for evaluation of hematochezia (hemorrhoids found). MEDICATIONS: Monitored anesthesia care and Propofol 250 mg IV  DESCRIPTION OF PROCEDURE:   After the risks benefits and alternatives of the procedure were thoroughly explained, informed consent was obtained.  The digital rectal exam revealed no rectal mass.   The LB [id number]  endoscope was introduced through the anus and advanced to the cecum, which was identified by both the appendix and ileocecal valve. No adverse events experienced. The quality of the prep was good.  (Suprep was used)  The instrument was then slowly withdrawn as the colon was fully examined. Estimated blood loss is zero unless otherwise noted in this procedure report.      COLON FINDINGS: A normal appearing cecum, ileocecal valve, and appendiceal orifice were identified.  The ascending, transverse, descending, sigmoid colon, and rectum appeared unremarkable. Retroflexed views revealed internal hemorrhoids. The time to cecum = 4.2 Withdrawal time = 9.9   The scope was withdrawn and the procedure completed.  COMPLICATIONS: There were no immediate  complications.  ENDOSCOPIC IMPRESSION: Normal colonoscopy  RECOMMENDATIONS: You should continue to follow colorectal cancer screening guidelines for "routine risk" patients with a repeat colonoscopy in 10 years. There is no need for FOBT (stool) testing for at least 5 years.  eSigned:  Beverley FiedlerJay M Xzayvion Vaeth, MD 05/08/2015 8:49 AM   cc:  the patient, Dr. Christell ConstantMoore

## 2015-05-12 ENCOUNTER — Telehealth: Payer: Self-pay | Admitting: *Deleted

## 2015-05-12 NOTE — Telephone Encounter (Signed)
  Follow up Call-  Call back number 05/08/2015  Post procedure Call Back phone  # [phone]  Permission to leave phone message Yes     Patient questions:  Do you have a fever, pain , or abdominal swelling? No. Pain Score  0 *  Have you tolerated food without any problems? Yes.    Have you been able to return to your normal activities? Yes.    Do you have any questions about your discharge instructions: Diet   No. Medications  No. Follow up visit  No.  Do you have questions or concerns about your Care? No.  Actions: * If pain score is 4 or above: No action needed, pain <4.

## 2015-05-18 ENCOUNTER — Ambulatory Visit: Payer: Managed Care, Other (non HMO) | Admitting: Podiatry

## 2015-05-26 ENCOUNTER — Ambulatory Visit: Payer: Managed Care, Other (non HMO) | Admitting: Podiatry

## 2015-06-09 ENCOUNTER — Encounter: Payer: Self-pay | Admitting: Podiatry

## 2015-06-09 ENCOUNTER — Ambulatory Visit (INDEPENDENT_AMBULATORY_CARE_PROVIDER_SITE_OTHER): Payer: Managed Care, Other (non HMO) | Admitting: Podiatry

## 2015-06-09 DIAGNOSIS — B351 Tinea unguium: Secondary | ICD-10-CM | POA: Diagnosis not present

## 2015-06-09 DIAGNOSIS — M79606 Pain in leg, unspecified: Secondary | ICD-10-CM

## 2015-06-09 NOTE — Progress Notes (Signed)
Subjective: 55 year old male presents complaining of painful calluses and toe nails. Left foot lesion is not painful any more.  He is doing good with custom orthotics.  Objective: Dermatologic: Porokeratotic lesions at center ball right foot. Pinch calluses under the first MPJ bilateral. Thick dystrophic nails on both great toes.  Vascular: All pedal pulses are palpable.  Orthopedic: Rectus foot without gross deformities. Neurologic: Neurovascular status are within normal.   Assessment: Painful porokeratosis under the ball of right foot. Onychomycosis x 10 .  Plan: All lesions and abnormal nails debrided. Return in 3 months or sooner if left foot lesion becomes painful.

## 2015-06-24 ENCOUNTER — Encounter: Payer: Self-pay | Admitting: Family Medicine

## 2015-06-24 ENCOUNTER — Ambulatory Visit (INDEPENDENT_AMBULATORY_CARE_PROVIDER_SITE_OTHER): Payer: Managed Care, Other (non HMO) | Admitting: Family Medicine

## 2015-06-24 VITALS — BP 132/77 | HR 68 | Temp 97.0°F | Ht 70.0 in | Wt 221.0 lb

## 2015-06-24 DIAGNOSIS — I10 Essential (primary) hypertension: Secondary | ICD-10-CM

## 2015-06-24 MED ORDER — AMLODIPINE BESYLATE 5 MG PO TABS: 5.0000 mg | ORAL_TABLET | Freq: Every day | ORAL | Status: DC

## 2015-06-24 NOTE — Patient Instructions (Addendum)
Return in 2 weeks - bring BP reading and expect labs this day (you can eat) Start New BP med daily  Watch sodium intake Lose weight Drink more water No fast foods No soda pops Stop diclofenac and take extra strength Tylenol instead

## 2015-06-24 NOTE — Progress Notes (Signed)
Subjective:    Patient ID: Jacob Cline, male    DOB: Apr 05, 1961, 55 y.o.   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 He was given a 1 month grace period to get his BP under better control. The patient is very anxious. His average blood pressures at home and been running in the 140s over the 98 range. 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 When he came in here today it was 132/77 with the automatic monitor. While in the room it was 152/100. That was in the right arm. He was rechecked again and it was 142/100 and the left arm. The patient denies any chest pain or chest tightness and no GI symptoms. 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      Patient Active Problem List   Diagnosis Date Noted  . Degenerative arthritis of hip 10/28/2014  . Metatarsalgia of both feet 10/24/2014  . Porokeratosis 08/07/2013  . Pain in lower limb 08/07/2013  . Onychomycosis 08/07/2013  . Chest discomfort 05/28/2013  . Hypertension 01/30/2013  . Hyperlipidemia 01/30/2013  . Hiatal hernia with gastroesophageal reflux 01/30/2013  . BPH (benign prostatic hyperplasia) 01/30/2013  . Anxiety state, unspecified, history of 01/30/2013  . Low back pain 01/30/2013  . Erectile dysfunction 01/30/2013   Outpatient Encounter Prescriptions as of 06/24/2015  Medication Sig  . aspirin 81 MG chewable tablet Chew 81 mg by mouth daily.    . cholecalciferol (VITAMIN D) 1000 UNITS tablet Take 1,000 Units by mouth daily. 3 tablets daily  . Cinnamon 500 MG capsule 500 mg.  . diclofenac (VOLTAREN) 75 MG EC tablet Take 1 tablet (75 mg total) by mouth 2 (two) times daily.  . Multiple Vitamin (MULTIVITAMIN) tablet 1 tablet.  . Omega-3 Fatty Acids (FISH OIL) 1000 MG CAPS 1 capsule.  [name] omeprazole (PRILOSEC) 20 MG capsule Take 1 capsule (20 mg total) by mouth daily.  . pravastatin (PRAVACHOL) 40 MG tablet Take 1 tablet (40 mg total) by mouth at bedtime.   . triamterene-hydrochlorothiazide (MAXZIDE-25) 37.5-25 MG tablet Take 1 tablet by mouth daily.  . TURMERIC PO Take by mouth daily.  . vardenafil (LEVITRA) 20 MG tablet Take 20 mg by mouth daily.     No facility-administered encounter medications on file as of 06/24/2015.     Review of Systems  Constitutional: Negative.   HENT: Negative.   Eyes: Negative.   Respiratory: Negative.   Cardiovascular: Negative.   Gastrointestinal: Negative.   Endocrine: Negative.   Genitourinary: Negative.   Musculoskeletal: Negative.   Skin: Negative.   Allergic/Immunologic: Negative.   Neurological: Negative.   Hematological: Negative.   Psychiatric/Behavioral: Negative.        Objective:   Physical Exam  Constitutional: He is oriented to person, place, and time. He appears well-developed and well-nourished. No distress.  HENT:  Head: Normocephalic and atraumatic.  Eyes: Conjunctivae and EOM are normal. Pupils are equal, round, and reactive to light. Right eye exhibits no discharge. Left eye exhibits no discharge.  Neck: Normal range of motion.  Cardiovascular: Normal rate, regular rhythm and normal heart sounds.   No murmur heard. Pulmonary/Chest: Effort normal and breath sounds normal. He has no wheezes. He has no rales.  Musculoskeletal: He exhibits no edema.  Neurological: He is alert and oriented to person, place, and time.  Skin: Skin is warm and dry. No rash noted.  Psychiatric: He has a normal  mood and affect. His behavior is normal.  Anxious  Vitals reviewed.  BP 132/77 mmHg  Pulse 68  Temp(Src) 97 F (36.1 C) (Oral)  Ht _0  (1.778 m)  Wt 221 lb (100.245 kg)  BMI 31.71 kg/m2        Assessment & Plan:  1. Essential hypertension -Watch diet and closely avoid any kind of soda drinks. Stop diclofenac. Lose weight. -Start amlodipine 5 mg 1 daily -Return to clinic in 2 weeks with home blood pressure readings for review - BMP8+EGFR; Future  Meds ordered this encounter    Medications  . amLODipine (NORVASC) 5 MG tablet    Sig: Take 1 tablet (5 mg total) by mouth daily.    Dispense:  90 tablet    Refill:  3   Patient Instructions  Return in 2 weeks - bring BP reading and expect labs this day (you can eat) Start New BP med daily  Watch sodium intake Lose weight Drink more water No fast foods No soda pops Stop diclofenac and take extra strength Tylenol instead   Arrie Senate MD

## 2015-07-08 ENCOUNTER — Encounter: Payer: Self-pay | Admitting: Family Medicine

## 2015-07-08 ENCOUNTER — Ambulatory Visit (INDEPENDENT_AMBULATORY_CARE_PROVIDER_SITE_OTHER): Payer: Managed Care, Other (non HMO) | Admitting: Family Medicine

## 2015-07-08 VITALS — BP 96/63 | HR 67 | Temp 97.0°F | Ht 70.0 in | Wt 216.0 lb

## 2015-07-08 DIAGNOSIS — I10 Essential (primary) hypertension: Secondary | ICD-10-CM | POA: Diagnosis not present

## 2015-07-08 NOTE — Patient Instructions (Signed)
Continue current blood pressure medications Continue current diet efforts and weight loss Continue to leave off anti-inflammatory medicine Continue to drink plenty of water and fluids and less soda drinks Continue to eat less bologna sandwiches All of this will continue to make a big difference with your blood pressure and her weight

## 2015-07-08 NOTE — Progress Notes (Signed)
Subjective:    Patient ID: Jacob Cline, male    DOB: [birth year], 55 y.o.   MRN: [id number]  HPI Patient here today for 2 week follow up on HTN. He is taking medications regularly. The patient is feeling better and having less headaches and dizziness. He is also make a special effort to watch his diet more closely reducing his sodium intake drinking more water and eating foods and salads that are more healthy. His lost 5 pounds of weight. He's been taking the amlodipine 5 mg daily. He has had a recent virus intestinal-wise but is doing better with this.     Patient Active Problem List   Diagnosis Date Noted  . Degenerative arthritis of hip 10/28/2014  . Metatarsalgia of both feet 10/24/2014  . Porokeratosis 08/07/2013  . Pain in lower limb 08/07/2013  . Onychomycosis 08/07/2013  . Chest discomfort 05/28/2013  . Hypertension 01/30/2013  . Hyperlipidemia 01/30/2013  . Hiatal hernia with gastroesophageal reflux 01/30/2013  . BPH (benign prostatic hyperplasia) 01/30/2013  . Anxiety state, unspecified, history of 01/30/2013  . Low back pain 01/30/2013  . Erectile dysfunction 01/30/2013   Outpatient Encounter Prescriptions as of 07/08/2015  Medication Sig  . amLODipine (NORVASC) 5 MG tablet Take 1 tablet (5 mg total) by mouth daily.  [name] aspirin 81 MG chewable tablet Chew 81 mg by mouth daily.    . cholecalciferol (VITAMIN D) 1000 UNITS tablet Take 1,000 Units by mouth daily. 3 tablets daily  . Cinnamon 500 MG capsule 500 mg.  . diclofenac (VOLTAREN) 75 MG EC tablet Take 1 tablet (75 mg total) by mouth 2 (two) times daily.  . Multiple Vitamin (MULTIVITAMIN) tablet 1 tablet.  . Omega-3 Fatty Acids (FISH OIL) 1000 MG CAPS 1 capsule.  [name] omeprazole (PRILOSEC) 20 MG capsule Take 1 capsule (20 mg total) by mouth daily.  . pravastatin (PRAVACHOL) 40 MG tablet Take 1 tablet (40 mg total) by mouth at bedtime.  . triamterene-hydrochlorothiazide (MAXZIDE-25) 37.5-25 MG tablet Take 1 tablet by  mouth daily.  . TURMERIC PO Take by mouth daily.  . vardenafil (LEVITRA) 20 MG tablet Take 20 mg by mouth daily.     No facility-administered encounter medications on file as of 07/08/2015.      Review of Systems  Constitutional: Negative.   HENT: Negative.   Eyes: Negative.   Respiratory: Negative.   Cardiovascular: Negative.   Gastrointestinal: Negative.   Endocrine: Negative.   Genitourinary: Negative.   Musculoskeletal: Negative.   Skin: Negative.   Allergic/Immunologic: Negative.   Neurological: Negative.   Hematological: Negative.   Psychiatric/Behavioral: Negative.        Objective:   Physical Exam  Constitutional: He is oriented to person, place, and time. He appears well-developed and well-nourished. No distress.  HENT:  Head: Normocephalic and atraumatic.  Eyes: Conjunctivae and EOM are normal. Pupils are equal, round, and reactive to light. Right eye exhibits no discharge. Left eye exhibits no discharge. No scleral icterus.  Neck: Normal range of motion. Neck supple.  Cardiovascular: Normal rate, regular rhythm and normal heart sounds.   No murmur heard. Pulmonary/Chest: Effort normal and breath sounds normal. He has no wheezes. He has no rales.  Musculoskeletal: Normal range of motion. He exhibits no edema.  Lymphadenopathy:    He has no cervical adenopathy.  Neurological: He is alert and oriented to person, place, and time.  Skin: Skin is warm and dry. No rash noted. No pallor.  Psychiatric: He has a normal mood and  affect. His behavior is normal. Judgment and thought content normal.  Nursing note and vitals reviewed.   BP 96/63 mmHg  Pulse 67  Temp(Src) 97 F (36.1 C) (Oral)  Ht 5' 10"  (1.778 m)  Wt 216 lb (97.977 kg)  BMI 30.99 kg/m2       Assessment & Plan:  1. Essential hypertension -The blood pressure is now normal and under excellent control with current medication -He'll continue with his weight loss efforts sodium restriction and leaving  off his diclofenac and only taking Tylenol if needed for pain - BMP8+EGFR  Patient Instructions  Continue current blood pressure medications Continue current diet efforts and weight loss Continue to leave off anti-inflammatory medicine Continue to drink plenty of water and fluids and less soda drinks Continue to eat less bologna sandwiches All of this will continue to make a big difference with your blood pressure and her weight   Arrie Senate MD

## 2015-07-09 LAB — BMP8+EGFR
BUN / CREAT RATIO: 22 — AB (ref 9–20)
BUN: 16 mg/dL (ref 6–24)
CHLORIDE: 98 mmol/L (ref 96–106)
CO2: 26 mmol/L (ref 18–29)
Calcium: 9.4 mg/dL (ref 8.7–10.2)
Creatinine, Ser: 0.72 mg/dL — ABNORMAL LOW (ref 0.76–1.27)
GFR calc Af Amer: 121 mL/min/{1.73_m2} (ref >59)
GFR calc non Af Amer: 104 mL/min/{1.73_m2} (ref >59)
GLUCOSE: 94 mg/dL (ref 65–99)
POTASSIUM: 3.9 mmol/L (ref 3.5–5.2)
SODIUM: 140 mmol/L (ref 134–144)

## 2015-07-20 ENCOUNTER — Telehealth: Payer: Self-pay | Admitting: Family Medicine

## 2015-07-20 NOTE — Telephone Encounter (Signed)
Patient has an upcoming DOT physical and he is anxious about his BP not passing. Patients BP in the am's before his medication is 143/92. Patient advised to make sure he takes his medication 1 hour prior to appointment, to limit his caffeine and salt intake. Patient states that his BP is good once he takes his medication.

## 2015-09-01 ENCOUNTER — Ambulatory Visit (INDEPENDENT_AMBULATORY_CARE_PROVIDER_SITE_OTHER): Payer: Managed Care, Other (non HMO) | Admitting: Family Medicine

## 2015-09-01 ENCOUNTER — Encounter: Payer: Self-pay | Admitting: Family Medicine

## 2015-09-01 VITALS — BP 122/74 | HR 81 | Temp 98.0°F | Ht 70.0 in | Wt 216.0 lb

## 2015-09-01 DIAGNOSIS — I10 Essential (primary) hypertension: Secondary | ICD-10-CM

## 2015-09-01 DIAGNOSIS — J301 Allergic rhinitis due to pollen: Secondary | ICD-10-CM

## 2015-09-01 DIAGNOSIS — E785 Hyperlipidemia, unspecified: Secondary | ICD-10-CM

## 2015-09-01 DIAGNOSIS — M5442 Lumbago with sciatica, left side: Secondary | ICD-10-CM | POA: Diagnosis not present

## 2015-09-01 DIAGNOSIS — N5201 Erectile dysfunction due to arterial insufficiency: Secondary | ICD-10-CM | POA: Diagnosis not present

## 2015-09-01 DIAGNOSIS — N400 Enlarged prostate without lower urinary tract symptoms: Secondary | ICD-10-CM | POA: Diagnosis not present

## 2015-09-01 DIAGNOSIS — E559 Vitamin D deficiency, unspecified: Secondary | ICD-10-CM | POA: Diagnosis not present

## 2015-09-01 MED ORDER — TRIAMTERENE-HCTZ 37.5-25 MG PO TABS: 1.0000 | ORAL_TABLET | Freq: Every day | ORAL | Status: DC

## 2015-09-01 MED ORDER — OMEPRAZOLE 20 MG PO CPDR: 20.0000 mg | DELAYED_RELEASE_CAPSULE | Freq: Every day | ORAL | Status: DC

## 2015-09-01 MED ORDER — AMLODIPINE BESYLATE 5 MG PO TABS: 5.0000 mg | ORAL_TABLET | Freq: Every day | ORAL | Status: DC

## 2015-09-01 MED ORDER — PRAVASTATIN SODIUM 40 MG PO TABS: 40.0000 mg | ORAL_TABLET | Freq: Every day | ORAL | Status: DC

## 2015-09-01 MED ORDER — FLUTICASONE PROPIONATE 50 MCG/ACT NA SUSP: 2.0000 | Freq: Every day | NASAL | Status: DC

## 2015-09-01 NOTE — Addendum Note (Signed)
Addended by: Magdalene RiverBULLINS, Derika Eckles H on: 09/01/2015 04:40 PM   Modules accepted: Orders

## 2015-09-01 NOTE — Patient Instructions (Addendum)
Continue current medications. Continue good therapeutic lifestyle changes which include good diet and exercise. Fall precautions discussed with patient. If an FOBT was given today- please return it to our front desk. If you are over 57 years old - you may need Prevnar 13 or the adult Pneumonia vaccine.  **Flu shots are available--- please call and schedule a FLU-CLINIC appointment**  After your visit with us today you will receive a survey in the mail or online from [employer] regarding your care with us. Please take a moment to fill this out. Your feedback is very important to us as you can help us better understand your patient needs as well as improve your experience and satisfaction. WE CARE ABOUT YOU!!!   The patient should continue his diclofenac and monitor his blood pressure closely and watch his sodium intake We will be getting lab work which has liver function tests and a CBC on it and we will make sure that is okay He should call his orthopedist and see if that was set him up for some physical therapy for his low back After doing the physical therapy and taking the diclofenac for 2-3 weeks if there's no improvement then he may want to talk to the orthopedist again and get an MRI and possibly could do some selective nerve root injections to help his back pain get better He should avoid heavy lifting pushing and pulling and straining. Use Flonase at nighttime 1 spray each nostril and use nasal saline during the day 3 of 4 sprays each nostril

## 2015-09-01 NOTE — Progress Notes (Signed)
Subjective:    Patient ID: Jacob Cline, male    DOB: Oct 13, 1960, 55 y.o.   MRN: [id number]  HPI Pt here for follow up and management of chronic medical problems which includes hyperlipidemia and hypertension. He is taking medications regularly.The patient was concerned about his blood pressure several weeks ago and once he stops and anti-inflammatory medicines blood pressure seemed to get better. 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 Since then however he is developed some low back pain. The low back pain seems to go more to the left side into the right. He is seen the orthopedic surgeons and they put him on a short course of prednisone and this did not help that much. Since she's been back on his diclofenac with normal blood pressures so far his back pain may be somewhat better. He was reassured that it was okay to continue diclofenac as long as he checked his blood pressures regularly. He will call his orthopedic group and see if they can set him up for some physical therapy and he will continue to diclofenac. If his pain does not get better he may then need another MRI. He was given a copy of the MRI from 2008 which showed degenerative changes in the low back affecting the left side more than the right side. The patient denies chest pain or shortness of breath. He's not having any problems with heartburn indigestion nausea vomiting diarrhea or blood in the stool. He is passing his water without problems.     Patient Active Problem List   Diagnosis Date Noted  . Degenerative arthritis of hip 10/28/2014  . Metatarsalgia of both feet 10/24/2014  . Porokeratosis 08/07/2013  . Pain in lower limb 08/07/2013  . Onychomycosis 08/07/2013  . Chest discomfort 05/28/2013  . Hypertension 01/30/2013  . Hyperlipidemia 01/30/2013  . Hiatal hernia with gastroesophageal reflux 01/30/2013  . BPH (benign prostatic hyperplasia) 01/30/2013  . Anxiety state, unspecified, history of 01/30/2013  . Low back  pain 01/30/2013  . Erectile dysfunction 01/30/2013   Outpatient Encounter Prescriptions as of 09/01/2015  Medication Sig  . amLODipine (NORVASC) 5 MG tablet Take 1 tablet (5 mg total) by mouth daily.  [name] aspirin 81 MG chewable tablet Chew 81 mg by mouth daily.    . cholecalciferol (VITAMIN D) 1000 UNITS tablet Take 1,000 Units by mouth daily. 3 tablets daily  . Cinnamon 500 MG capsule 500 mg.  . diclofenac (VOLTAREN) 75 MG EC tablet Take 1 tablet (75 mg total) by mouth 2 (two) times daily.  . Multiple Vitamin (MULTIVITAMIN) tablet 1 tablet.  . Omega-3 Fatty Acids (FISH OIL) 1000 MG CAPS 1 capsule.  [name] omeprazole (PRILOSEC) 20 MG capsule Take 1 capsule (20 mg total) by mouth daily.  . pravastatin (PRAVACHOL) 40 MG tablet Take 1 tablet (40 mg total) by mouth at bedtime.  . triamterene-hydrochlorothiazide (MAXZIDE-25) 37.5-25 MG tablet Take 1 tablet by mouth daily.  . TURMERIC PO Take by mouth daily.  . vardenafil (LEVITRA) 20 MG tablet Take 20 mg by mouth daily.    . [DISCONTINUED] amLODipine (NORVASC) 5 MG tablet Take 1 tablet (5 mg total) by mouth daily.  . [DISCONTINUED] omeprazole (PRILOSEC) 20 MG capsule Take 1 capsule (20 mg total) by mouth daily.  . [DISCONTINUED] pravastatin (PRAVACHOL) 40 MG tablet Take 1 tablet (40 mg total) by mouth at bedtime.  . [DISCONTINUED] triamterene-hydrochlorothiazide (MAXZIDE-25) 37.5-25 MG tablet Take 1 tablet by mouth daily.   No facility-administered encounter medications on file as of  09/01/2015.      Review of Systems  Constitutional: Negative.   HENT: Negative.   Eyes: Negative.   Respiratory: Negative.   Cardiovascular: Negative.   Gastrointestinal: Negative.   Endocrine: Negative.   Genitourinary: Negative.   Musculoskeletal: Positive for back pain.  Skin: Negative.   Allergic/Immunologic: Negative.   Neurological: Negative.   Hematological: Negative.   Psychiatric/Behavioral: Negative.        Objective:   Physical Exam    Constitutional: He is oriented to person, place, and time. He appears well-developed and well-nourished. No distress.  HENT:  Head: Normocephalic and atraumatic.  Right Ear: External ear normal.  Left Ear: External ear normal.  Mouth/Throat: Oropharynx is clear and moist. No oropharyngeal exudate.  Nasal congestion bilaterally  Eyes: Conjunctivae and EOM are normal. Pupils are equal, round, and reactive to light. Right eye exhibits no discharge. Left eye exhibits no discharge. No scleral icterus.  Neck: Normal range of motion. Neck supple. No thyromegaly present.  Cardiovascular: Normal rate, regular rhythm, normal heart sounds and intact distal pulses.   No murmur heard. Pulmonary/Chest: Effort normal and breath sounds normal. No respiratory distress. He has no wheezes. He has no rales. He exhibits no tenderness.  Clear anteriorly and posteriorly  Abdominal: Soft. Bowel sounds are normal. He exhibits no mass. There is no tenderness. There is no rebound and no guarding.  Abdominal obesity without masses tenderness or organ enlargement  Musculoskeletal: Normal range of motion. He exhibits no edema or tenderness.  Lymphadenopathy:    He has no cervical adenopathy.  Neurological: He is alert and oriented to person, place, and time. He has normal reflexes. No cranial nerve deficit.  Skin: Skin is warm and dry. No rash noted.  Psychiatric: He has a normal mood and affect. His behavior is normal. Judgment and thought content normal.  Nursing note and vitals reviewed.  BP 122/74 mmHg  Pulse 81  Temp(Src) 98 F (36.7 C) (Oral)  Ht 5' 10"  (1.778 m)  Wt 216 lb (97.977 kg)  BMI 30.99 kg/m2        Assessment & Plan:  1. Hyperlipidemia -Continue current treatment and aggressive therapeutic lifestyle changes pending results of lab work - CBC with Differential/Platelet; Future - NMR, lipoprofile; Future  2. Essential hypertension -The blood pressure is good today and the patient will  continue to monitor this at home especially while taking his diclofenac for his back pain - BMP8+EGFR; Future - CBC with Differential/Platelet; Future - Hepatic function panel; Future  3. BPH (benign prostatic hyperplasia) -No symptoms with voiding - CBC with Differential/Platelet; Future  4. Vitamin D deficiency -Continue current treatment pending results of lab work - CBC with Differential/Platelet; Future - VITAMIN D 25 Hydroxy (Vit-D Deficiency, Fractures); Future  5. Erectile dysfunction due to arterial insufficiency -No complaints with this today and patient will continue with his current treatment  6. Midline low back pain with left-sided sciatica -Continue with diclofenac -Call orthopedic surgery group and see if they will schedule you for some physical therapy for the low back -If you do not get better with the physical therapy then he may need to have another MRI and possibly some injections  7. Allergic rhinitis due to pollen -Flonase nasal spray 1 sprays nostril at bedtime and continue with saline nose spray  Meds ordered this encounter  Medications  . amLODipine (NORVASC) 5 MG tablet    Sig: Take 1 tablet (5 mg total) by mouth daily.    Dispense:  90 tablet  Refill:  3  . omeprazole (PRILOSEC) 20 MG capsule    Sig: Take 1 capsule (20 mg total) by mouth daily.    Dispense:  90 capsule    Refill:  3  . pravastatin (PRAVACHOL) 40 MG tablet    Sig: Take 1 tablet (40 mg total) by mouth at bedtime.    Dispense:  90 tablet    Refill:  3  . triamterene-hydrochlorothiazide (MAXZIDE-25) 37.5-25 MG tablet    Sig: Take 1 tablet by mouth daily.    Dispense:  90 tablet    Refill:  3   Patient Instructions  Continue current medications. Continue good therapeutic lifestyle changes which include good diet and exercise. Fall precautions discussed with patient. If an FOBT was given today- please return it to our front desk. If you are over 61 years old - you may need  Prevnar 39 or the adult Pneumonia vaccine.  **Flu shots are available--- please call and schedule a FLU-CLINIC appointment**  After your visit with [place] today you will receive a survey in the mail or online from [employer] regarding your care with [place]. Please take a moment to fill this out. Your feedback is very important to [place] as you can help [place] better understand your patient needs as well as improve your experience and satisfaction. WE CARE ABOUT YOU!!!   The patient should continue his diclofenac and monitor his blood pressure closely and watch his sodium intake We will be getting lab work which has liver function tests and a CBC on it and we will make sure that is okay He should call his orthopedist and see if that was set him up for some physical therapy for his low back After doing the physical therapy and taking the diclofenac for 2-3 weeks if there's no improvement then he may want to talk to the orthopedist again and get an MRI and possibly could do some selective nerve root injections to help his back pain get better He should avoid heavy lifting pushing and pulling and straining. Use Flonase at nighttime 1 spray each nostril and use nasal saline during the day 3 of 4 sprays each nostril   Arrie Senate MD

## 2015-09-08 ENCOUNTER — Telehealth: Payer: Self-pay | Admitting: Family Medicine

## 2015-09-08 ENCOUNTER — Encounter: Payer: Self-pay | Admitting: Podiatry

## 2015-09-08 ENCOUNTER — Ambulatory Visit (INDEPENDENT_AMBULATORY_CARE_PROVIDER_SITE_OTHER): Payer: Managed Care, Other (non HMO) | Admitting: Podiatry

## 2015-09-08 VITALS — BP 130/81 | HR 76

## 2015-09-08 DIAGNOSIS — M79606 Pain in leg, unspecified: Secondary | ICD-10-CM | POA: Diagnosis not present

## 2015-09-08 DIAGNOSIS — B351 Tinea unguium: Secondary | ICD-10-CM | POA: Diagnosis not present

## 2015-09-08 DIAGNOSIS — Q828 Other specified congenital malformations of skin: Secondary | ICD-10-CM

## 2015-09-08 DIAGNOSIS — M21969 Unspecified acquired deformity of unspecified lower leg: Secondary | ICD-10-CM

## 2015-09-08 NOTE — Patient Instructions (Signed)
Seen for hypertrophic nails and painful calluses. All nails and calluses debrided. Return in 3 months or as needed.  

## 2015-09-08 NOTE — Telephone Encounter (Signed)
Spoke to pt and he states he used to be on Paxil 12.5mg  24 hour tablet. He hasn't taken it since 2014 but would like to go back on it. Pt is aware you're out of the office until tomorrow. Please advise.

## 2015-09-08 NOTE — Telephone Encounter (Signed)
Please restart Paxil and call her prescription in for patient to take with refills for 3 months

## 2015-09-08 NOTE — Progress Notes (Signed)
Subjective: 55 year old male presents complaining of painful calluses and toe nails. Left foot lesion is very painful. He is doing good with custom orthotics.  Objective: Dermatologic: Painful porokeratotic lesions under 4th MPJ left foot. Pinch calluses under the first MPJ bilateral. Thick dystrophic nails on both great toes.  Vascular: All pedal pulses are palpable.  Orthopedic: Rectus foot without gross deformities. Neurologic: Neurovascular status are within normal.   Assessment: Painful porokeratosis under 4th MPJ left. Multiple pinch callus around the great toe bilateral. Thick dystrophic mycotic nail both great toes.  Plan: All lesions and abnormal nails debrided. Return in 3 months or sooner if left foot lesion becomes painful.  

## 2015-09-09 MED ORDER — PAROXETINE HCL ER 12.5 MG PO TB24: 12.5000 mg | ORAL_TABLET | Freq: Every day | ORAL | Status: DC

## 2015-09-09 NOTE — Telephone Encounter (Signed)
rx sent to pharm and pt aware 

## 2015-10-02 ENCOUNTER — Other Ambulatory Visit: Payer: Managed Care, Other (non HMO)

## 2015-10-02 DIAGNOSIS — I10 Essential (primary) hypertension: Secondary | ICD-10-CM

## 2015-10-02 DIAGNOSIS — E559 Vitamin D deficiency, unspecified: Secondary | ICD-10-CM

## 2015-10-02 DIAGNOSIS — E785 Hyperlipidemia, unspecified: Secondary | ICD-10-CM

## 2015-10-02 DIAGNOSIS — N400 Enlarged prostate without lower urinary tract symptoms: Secondary | ICD-10-CM

## 2015-10-03 LAB — NMR, LIPOPROFILE
CHOLESTEROL: 155 mg/dL (ref 100–199)
HDL Cholesterol by NMR: 52 mg/dL (ref >39)
HDL Particle Number: 37.6 umol/L (ref >=30.5)
LDL PARTICLE NUMBER: 1017 nmol/L — AB (ref <1000)
LDL Size: 21.1 nm (ref >20.5)
LDL-C: 83 mg/dL (ref 0–99)
LP-IR Score: 54 — ABNORMAL HIGH (ref <=45)
Small LDL Particle Number: 231 nmol/L (ref <=527)
TRIGLYCERIDES BY NMR: 99 mg/dL (ref 0–149)

## 2015-10-03 LAB — HEPATIC FUNCTION PANEL
ALBUMIN: 4.1 g/dL (ref 3.5–5.5)
ALT: 30 IU/L (ref 0–44)
AST: 21 IU/L (ref 0–40)
Alkaline Phosphatase: 86 IU/L (ref 39–117)
BILIRUBIN, DIRECT: 0.14 mg/dL (ref 0.00–0.40)
Bilirubin Total: 0.5 mg/dL (ref 0.0–1.2)
TOTAL PROTEIN: 6.7 g/dL (ref 6.0–8.5)

## 2015-10-03 LAB — CBC WITH DIFFERENTIAL/PLATELET
BASOS ABS: 0 10*3/uL (ref 0.0–0.2)
Basos: 0 %
EOS (ABSOLUTE): 0.1 10*3/uL (ref 0.0–0.4)
Eos: 1 %
Hematocrit: 45.4 % (ref 37.5–51.0)
Hemoglobin: 15.6 g/dL (ref 12.6–17.7)
IMMATURE GRANS (ABS): 0 10*3/uL (ref 0.0–0.1)
Immature Granulocytes: 0 %
LYMPHS: 19 %
Lymphocytes Absolute: 1.5 10*3/uL (ref 0.7–3.1)
MCH: 30.8 pg (ref 26.6–33.0)
MCHC: 34.4 g/dL (ref 31.5–35.7)
MCV: 90 fL (ref 79–97)
Monocytes Absolute: 0.4 10*3/uL (ref 0.1–0.9)
Monocytes: 6 %
NEUTROS ABS: 5.7 10*3/uL (ref 1.4–7.0)
Neutrophils: 74 %
PLATELETS: 196 10*3/uL (ref 150–379)
RBC: 5.06 x10E6/uL (ref 4.14–5.80)
RDW: 13.5 % (ref 12.3–15.4)
WBC: 7.7 10*3/uL (ref 3.4–10.8)

## 2015-10-03 LAB — BMP8+EGFR
BUN/Creatinine Ratio: 24 — ABNORMAL HIGH (ref 9–20)
BUN: 19 mg/dL (ref 6–24)
CALCIUM: 9.5 mg/dL (ref 8.7–10.2)
CHLORIDE: 100 mmol/L (ref 96–106)
CO2: 25 mmol/L (ref 18–29)
Creatinine, Ser: 0.8 mg/dL (ref 0.76–1.27)
GFR calc non Af Amer: 99 mL/min/{1.73_m2} (ref >59)
GFR, EST AFRICAN AMERICAN: 115 mL/min/{1.73_m2} (ref >59)
GLUCOSE: 88 mg/dL (ref 65–99)
POTASSIUM: 4 mmol/L (ref 3.5–5.2)
Sodium: 143 mmol/L (ref 134–144)

## 2015-10-03 LAB — VITAMIN D 25 HYDROXY (VIT D DEFICIENCY, FRACTURES): Vit D, 25-Hydroxy: 51.3 ng/mL (ref 30.0–100.0)

## 2015-10-06 ENCOUNTER — Telehealth: Payer: Self-pay | Admitting: Family Medicine

## 2015-10-06 NOTE — Telephone Encounter (Signed)
Patient aware of results.

## 2015-11-18 ENCOUNTER — Ambulatory Visit: Payer: Managed Care, Other (non HMO) | Admitting: Cardiovascular Disease

## 2015-12-08 ENCOUNTER — Encounter: Payer: Self-pay | Admitting: Podiatry

## 2015-12-08 ENCOUNTER — Ambulatory Visit (INDEPENDENT_AMBULATORY_CARE_PROVIDER_SITE_OTHER): Payer: Managed Care, Other (non HMO) | Admitting: Podiatry

## 2015-12-08 VITALS — BP 152/80 | HR 70

## 2015-12-08 DIAGNOSIS — Q828 Other specified congenital malformations of skin: Secondary | ICD-10-CM | POA: Diagnosis not present

## 2015-12-08 DIAGNOSIS — M21969 Unspecified acquired deformity of unspecified lower leg: Secondary | ICD-10-CM

## 2015-12-08 DIAGNOSIS — B351 Tinea unguium: Secondary | ICD-10-CM

## 2015-12-08 NOTE — Patient Instructions (Signed)
Seen for hypertrophic nails and calluses. All nails and calluses debrided. Return in 3 months or as needed.  

## 2015-12-08 NOTE — Progress Notes (Signed)
Subjective: 55 year old male presents complaining of painful calluses and toe nails. Left foot lesion is very painful. He is doing good with custom orthotics.  Objective: Dermatologic: Painful porokeratotic lesions under 4th MPJ left foot. Pinch calluses under the first MPJ bilateral. Thick dystrophic nails on both great toes.  Vascular: All pedal pulses are palpable.  Orthopedic: Rectus foot without gross deformities. Neurologic: Neurovascular status are within normal.   Assessment: Painful porokeratosis under 4th MPJ left. Multiple pinch callus around the great toe bilateral. Thick dystrophic mycotic nail both great toes.  Plan: All lesions and abnormal nails debrided. Return in 3 months or sooner if left foot lesion becomes painful.  

## 2015-12-09 ENCOUNTER — Ambulatory Visit: Payer: Managed Care, Other (non HMO) | Admitting: Podiatry

## 2016-01-05 ENCOUNTER — Encounter: Payer: Self-pay | Admitting: Family Medicine

## 2016-01-05 ENCOUNTER — Ambulatory Visit (INDEPENDENT_AMBULATORY_CARE_PROVIDER_SITE_OTHER): Payer: Managed Care, Other (non HMO) | Admitting: Family Medicine

## 2016-01-05 VITALS — BP 125/76 | HR 64 | Temp 97.4°F | Ht 70.0 in | Wt 226.0 lb

## 2016-01-05 DIAGNOSIS — E785 Hyperlipidemia, unspecified: Secondary | ICD-10-CM

## 2016-01-05 DIAGNOSIS — J392 Other diseases of pharynx: Secondary | ICD-10-CM

## 2016-01-05 DIAGNOSIS — M25552 Pain in left hip: Secondary | ICD-10-CM

## 2016-01-05 DIAGNOSIS — N400 Enlarged prostate without lower urinary tract symptoms: Secondary | ICD-10-CM

## 2016-01-05 DIAGNOSIS — Z0000 Encounter for general adult medical examination without abnormal findings: Secondary | ICD-10-CM

## 2016-01-05 DIAGNOSIS — I10 Essential (primary) hypertension: Secondary | ICD-10-CM

## 2016-01-05 DIAGNOSIS — E559 Vitamin D deficiency, unspecified: Secondary | ICD-10-CM

## 2016-01-05 LAB — MICROSCOPIC EXAMINATION
BACTERIA UA: NONE SEEN
EPITHELIAL CELLS (NON RENAL): NONE SEEN /HPF (ref 0–10)
WBC, UA: NONE SEEN /hpf (ref 0–?)

## 2016-01-05 LAB — URINALYSIS, COMPLETE
BILIRUBIN UA: NEGATIVE
Glucose, UA: NEGATIVE
Ketones, UA: NEGATIVE
LEUKOCYTES UA: NEGATIVE
Nitrite, UA: NEGATIVE
PH UA: 6 (ref 5.0–7.5)
Protein, UA: NEGATIVE
Specific Gravity, UA: 1.025 (ref 1.005–1.030)
Urobilinogen, Ur: 1 mg/dL (ref 0.2–1.0)

## 2016-01-05 NOTE — Progress Notes (Signed)
Subjective:    Patient ID: Jacob Cline, male    DOB: 11-05-1960, 55 y.o.   MRN: [id number]  HPI Patient is here today for annual wellness exam and follow up of chronic medical problems which includes hyperlipidemia and hypertension. He is taking medications regularly.The patient has a sensation of a pulled muscle in his throat, he is seeing an ear nose and throat specialist about this. He has bilateral hip and back pain. He is due for lab work but will have to return fasting for this. He is also due for fecal occult blood test and he will have to return this. His last colonoscopy was in December 16. We will get a urinalysis today. The patient today complains of a pulling sensation in his throat. He did see the ear nose and throat specialist and had a laryngoscopy on 2 different occasions and no abnormalities were noted on this. Other than some redness. The ear nose and throat specialist did have him take omeprazole 20 mg twice daily but this did not help any. The patient does take diclofenac 75 mg once a day because it helps his hands as well as his back and hips. He does not have any nausea vomiting diarrhea blood in the stool or black tarry bowel movements just the sensation of a pulling sensation in the back of his throat. This is now become more continuous the past couple of days and has been hurting him more in his throat. Ear nose and throat specialist had scheduled him for a visit with the gastroenterologist and that visit is coming up. The patient also admits to gaining 10 pounds of weight since his last visit back in April. He had his right hip replaced a year ago in July. He denies any trouble passing his water burning pain frequency or blood in the urine. He says that his home blood pressure readings have been running in the 120s over the 80-85 range. Also I forgot to mention that he had a swallowing study and that this was okay by the ear nose and throat specialist.      Patient Active  Problem List   Diagnosis Date Noted  . Degenerative arthritis of hip 10/28/2014  . Metatarsalgia of both feet 10/24/2014  . Porokeratosis 08/07/2013  . Pain in lower limb 08/07/2013  . Onychomycosis 08/07/2013  . Chest discomfort 05/28/2013  . Hypertension 01/30/2013  . Hyperlipidemia 01/30/2013  . Hiatal hernia with gastroesophageal reflux 01/30/2013  . BPH (benign prostatic hyperplasia) 01/30/2013  . Anxiety state, unspecified, history of 01/30/2013  . Low back pain 01/30/2013  . Erectile dysfunction 01/30/2013   Outpatient Encounter Prescriptions as of 01/05/2016  Medication Sig  . amLODipine (NORVASC) 5 MG tablet Take 1 tablet (5 mg total) by mouth daily.  [name] aspirin 81 MG chewable tablet Chew 81 mg by mouth daily.    . cholecalciferol (VITAMIN D) 1000 UNITS tablet Take 1,000 Units by mouth daily. 3 tablets daily  . Cinnamon 500 MG capsule 500 mg.  . diclofenac (VOLTAREN) 75 MG EC tablet Take 1 tablet (75 mg total) by mouth 2 (two) times daily.  . fluticasone (FLONASE) 50 MCG/ACT nasal spray Place 2 sprays into both nostrils daily.  . Multiple Vitamin (MULTIVITAMIN) tablet 1 tablet.  . Omega-3 Fatty Acids (FISH OIL) 1000 MG CAPS 1 capsule.  [name] omeprazole (PRILOSEC) 20 MG capsule Take 1 capsule (20 mg total) by mouth daily. (Patient taking differently: Take 20 mg by mouth 2 (two) times daily before a  meal. )  . PARoxetine (PAXIL CR) 12.5 MG 24 hr tablet Take 1 tablet (12.5 mg total) by mouth daily.  . pravastatin (PRAVACHOL) 40 MG tablet Take 1 tablet (40 mg total) by mouth at bedtime.  . triamterene-hydrochlorothiazide (MAXZIDE-25) 37.5-25 MG tablet Take 1 tablet by mouth daily.  . TURMERIC PO Take by mouth daily.  . vardenafil (LEVITRA) 20 MG tablet Take 20 mg by mouth daily.     No facility-administered encounter medications on file as of 01/05/2016.       Review of Systems  Constitutional: Negative.   HENT: Negative.   Eyes: Negative.   Respiratory: Positive for choking  (sensation of pulled muscle in throat).   Cardiovascular: Negative.   Gastrointestinal: Negative.   Endocrine: Negative.   Genitourinary: Negative.   Musculoskeletal: Positive for arthralgias (bilateral hip and low back pain).  Skin: Negative.   Allergic/Immunologic: Negative.   Neurological: Negative.   Hematological: Negative.   Psychiatric/Behavioral: Negative.        Objective:   Physical Exam  Constitutional: He is oriented to person, place, and time. He appears well-developed and well-nourished.  The patient is pleasant and alert  HENT:  Head: Normocephalic and atraumatic.  Right Ear: External ear normal.  Left Ear: External ear normal.  Nose: Nose normal.  Mouth/Throat: Oropharynx is clear and moist. No oropharyngeal exudate.  Eyes: Conjunctivae and EOM are normal. Pupils are equal, round, and reactive to light. Right eye exhibits no discharge. Left eye exhibits no discharge. No scleral icterus.  Neck: Normal range of motion. Neck supple. No thyromegaly present.  No thyromegaly anterior cervical adenopathy or bruits  Cardiovascular: Normal rate, regular rhythm, normal heart sounds and intact distal pulses.   No murmur heard. The heart is regular at 72/m  Pulmonary/Chest: Effort normal and breath sounds normal. No respiratory distress. He has no wheezes. He has no rales. He exhibits no tenderness.  Clear anteriorly and posteriorly  Abdominal: Soft. Bowel sounds are normal. He exhibits no mass. There is no tenderness. There is no rebound and no guarding.  The abdomen is obese. Just generalized abdominal tenderness and no liver or spleen enlargement and no palpable masses. There are no bruits.  Genitourinary: Rectum normal and penis normal.  Genitourinary Comments: The prostate is slightly enlarged without any lumps or masses. There are no rectal masses. The external genitalia were within normal limits with no inguinal hernias or inguinal nodes.  Musculoskeletal: Normal range  of motion. He exhibits no edema.  Lymphadenopathy:    He has no cervical adenopathy.  Neurological: He is alert and oriented to person, place, and time. He has normal reflexes. No cranial nerve deficit.  Skin: Skin is warm and dry. No rash noted.  Psychiatric: He has a normal mood and affect. His behavior is normal. Judgment and thought content normal.  Nursing note and vitals reviewed.  BP 125/76 (BP Location: Left Arm)   Pulse 64   Temp 97.4 F (36.3 C) (Oral)   Ht 5' 10"  (1.778 m)   Wt 226 lb (102.5 kg)   BMI 32.43 kg/m         Assessment & Plan:  1. Annual physical exam -Make every effort possible to lose weight - BMP8+EGFR; Future - CBC with Differential/Platelet; Future - Hepatic function panel; Future - VITAMIN D 25 Hydroxy (Vit-D Deficiency, Fractures); Future - NMR, lipoprofile; Future - PSA, total and free; Future - Urinalysis, Complete  2. Hyperlipidemia -Continue with aggressive therapeutic lifestyle changes and statin drug pending results  of lab work - CBC with Differential/Platelet; Future - NMR, lipoprofile; Future  3. Essential hypertension -The blood pressure is good today and patient should continue with current treatment - BMP8+EGFR; Future - CBC with Differential/Platelet; Future - Hepatic function panel; Future  4. BPH (benign prostatic hyperplasia) -The prostate gland is slightly enlarged without any lumps or masses. The patient is having no symptoms with voiding. - CBC with Differential/Platelet; Future - PSA, total and free; Future - Urinalysis, Complete  5. Vitamin D deficiency -Continue current treatment pending results of lab work - CBC with Differential/Platelet; Future - VITAMIN D 25 Hydroxy (Vit-D Deficiency, Fractures); Future  6. Throat irritation -Follow-up with gastroenterology as planned and increase omeprazole to 40 mg twice daily and avoid anti-inflammatory medicines and only take extra strength Tylenol.  7. Left hip  pain -Follow-up with orthopedics if pain continues.  Patient Instructions  Continue current medications. Continue good therapeutic lifestyle changes which include good diet and exercise. Fall precautions discussed with patient. If an FOBT was given today- please return it to our front desk. If you are over 4 years old - you may need Prevnar 58 or the adult Pneumonia vaccine.   After your visit with [place] today you will receive a survey in the mail or online from [employer] regarding your care with [place]. Please take a moment to fill this out. Your feedback is very important to [place] as you can help [place] better understand your patient needs as well as improve your experience and satisfaction. WE CARE ABOUT YOU!!!   Follow-up with gastroenterology as planned Increase the omeprazole to 40 mg twice daily at least until your visit to the gastroenterologist Also try to hold the diclofenac and only take extra strength Tylenol Avoid fried foods and caffeine Make every effort possible to lose weight Watch sodium intake and keep blood pressure checked regularly Return the FOBT    Arrie Senate MD

## 2016-01-05 NOTE — Patient Instructions (Addendum)
Continue current medications. Continue good therapeutic lifestyle changes which include good diet and exercise. Fall precautions discussed with patient. If an FOBT was given today- please return it to our front desk. If you are over 57 years old - you may need Prevnar 13 or the adult Pneumonia vaccine.   After your visit with us today you will receive a survey in the mail or online from [employer] regarding your care with us. Please take a moment to fill this out. Your feedback is very important to us as you can help us better understand your patient needs as well as improve your experience and satisfaction. WE CARE ABOUT YOU!!!   Follow-up with gastroenterology as planned Increase the omeprazole to 40 mg twice daily at least until your visit to the gastroenterologist Also try to hold the diclofenac and only take extra strength Tylenol Avoid fried foods and caffeine Make every effort possible to lose weight Watch sodium intake and keep blood pressure checked regularly Return the FOBT

## 2016-01-20 ENCOUNTER — Other Ambulatory Visit: Payer: Managed Care, Other (non HMO)

## 2016-01-20 DIAGNOSIS — I10 Essential (primary) hypertension: Secondary | ICD-10-CM

## 2016-01-20 DIAGNOSIS — E559 Vitamin D deficiency, unspecified: Secondary | ICD-10-CM

## 2016-01-20 DIAGNOSIS — Z0000 Encounter for general adult medical examination without abnormal findings: Secondary | ICD-10-CM

## 2016-01-20 DIAGNOSIS — N400 Enlarged prostate without lower urinary tract symptoms: Secondary | ICD-10-CM

## 2016-01-20 DIAGNOSIS — E785 Hyperlipidemia, unspecified: Secondary | ICD-10-CM

## 2016-01-21 ENCOUNTER — Telehealth: Payer: Self-pay | Admitting: Family Medicine

## 2016-01-21 LAB — VITAMIN D 25 HYDROXY (VIT D DEFICIENCY, FRACTURES): VIT D 25 HYDROXY: 37.2 ng/mL (ref 30.0–100.0)

## 2016-01-21 LAB — CBC WITH DIFFERENTIAL/PLATELET
BASOS: 0 %
Basophils Absolute: 0 10*3/uL (ref 0.0–0.2)
EOS (ABSOLUTE): 0.2 10*3/uL (ref 0.0–0.4)
EOS: 3 %
HEMATOCRIT: 45.2 % (ref 37.5–51.0)
HEMOGLOBIN: 15.7 g/dL (ref 12.6–17.7)
Immature Grans (Abs): 0 10*3/uL (ref 0.0–0.1)
Immature Granulocytes: 0 %
LYMPHS ABS: 1.6 10*3/uL (ref 0.7–3.1)
Lymphs: 27 %
MCH: 30.6 pg (ref 26.6–33.0)
MCHC: 34.7 g/dL (ref 31.5–35.7)
MCV: 88 fL (ref 79–97)
MONOCYTES: 9 %
Monocytes Absolute: 0.6 10*3/uL (ref 0.1–0.9)
NEUTROS ABS: 3.7 10*3/uL (ref 1.4–7.0)
Neutrophils: 61 %
Platelets: 194 10*3/uL (ref 150–379)
RBC: 5.13 x10E6/uL (ref 4.14–5.80)
RDW: 12.6 % (ref 12.3–15.4)
WBC: 6 10*3/uL (ref 3.4–10.8)

## 2016-01-21 LAB — HEPATIC FUNCTION PANEL
ALK PHOS: 83 IU/L (ref 39–117)
ALT: 28 IU/L (ref 0–44)
AST: 20 IU/L (ref 0–40)
Albumin: 4.1 g/dL (ref 3.5–5.5)
BILIRUBIN, DIRECT: 0.09 mg/dL (ref 0.00–0.40)
Bilirubin Total: 0.4 mg/dL (ref 0.0–1.2)
Total Protein: 6.5 g/dL (ref 6.0–8.5)

## 2016-01-21 LAB — BMP8+EGFR
BUN / CREAT RATIO: 40 — AB (ref 9–20)
BUN: 25 mg/dL — AB (ref 6–24)
CHLORIDE: 99 mmol/L (ref 96–106)
CO2: 26 mmol/L (ref 18–29)
CREATININE: 0.63 mg/dL — AB (ref 0.76–1.27)
Calcium: 9.1 mg/dL (ref 8.7–10.2)
GFR, EST AFRICAN AMERICAN: 126 mL/min/{1.73_m2} (ref >59)
GFR, EST NON AFRICAN AMERICAN: 109 mL/min/{1.73_m2} (ref >59)
Glucose: 87 mg/dL (ref 65–99)
Potassium: 4.2 mmol/L (ref 3.5–5.2)
Sodium: 141 mmol/L (ref 134–144)

## 2016-01-21 LAB — NMR, LIPOPROFILE
Cholesterol: 175 mg/dL (ref 100–199)
HDL CHOLESTEROL BY NMR: 48 mg/dL (ref >39)
HDL Particle Number: 35 umol/L (ref >=30.5)
LDL PARTICLE NUMBER: 1181 nmol/L — AB (ref <1000)
LDL Size: 21.4 nm (ref >20.5)
LDL-C: 101 mg/dL — ABNORMAL HIGH (ref 0–99)
LP-IR Score: 50 — ABNORMAL HIGH (ref <=45)
SMALL LDL PARTICLE NUMBER: 418 nmol/L (ref <=527)
Triglycerides by NMR: 128 mg/dL (ref 0–149)

## 2016-01-21 LAB — PSA, TOTAL AND FREE
PROSTATE SPECIFIC AG, SERUM: 0.7 ng/mL (ref 0.0–4.0)
PSA, Free Pct: 22.9 %
PSA, Free: 0.16 ng/mL

## 2016-01-21 NOTE — Telephone Encounter (Signed)
Patient aware of lab work

## 2016-03-08 ENCOUNTER — Ambulatory Visit (INDEPENDENT_AMBULATORY_CARE_PROVIDER_SITE_OTHER): Payer: Managed Care, Other (non HMO) | Admitting: Podiatry

## 2016-03-08 ENCOUNTER — Encounter: Payer: Self-pay | Admitting: Podiatry

## 2016-03-08 VITALS — BP 138/89 | HR 73

## 2016-03-08 DIAGNOSIS — M79671 Pain in right foot: Secondary | ICD-10-CM | POA: Diagnosis not present

## 2016-03-08 DIAGNOSIS — M21969 Unspecified acquired deformity of unspecified lower leg: Secondary | ICD-10-CM | POA: Diagnosis not present

## 2016-03-08 DIAGNOSIS — M79672 Pain in left foot: Secondary | ICD-10-CM

## 2016-03-08 DIAGNOSIS — Q828 Other specified congenital malformations of skin: Secondary | ICD-10-CM

## 2016-03-08 DIAGNOSIS — B351 Tinea unguium: Secondary | ICD-10-CM | POA: Diagnosis not present

## 2016-03-08 NOTE — Patient Instructions (Signed)
Seen for hypertrophic nails anc calluses. All nails and calluses debrided. Return in 3 months or as needed.

## 2016-03-08 NOTE — Progress Notes (Signed)
Subjective: 55 year old male presents complaining of painful calluses and toe nails. Left foot lesion is very painful. He is doing good with custom orthotics.  Objective: Dermatologic: Painful porokeratotic lesions under 4th MPJ left foot. Pinch calluses under the first MPJ bilateral. Thick dystrophic nails on both great toes.  Vascular: All pedal pulses are palpable.  Orthopedic: Rectus foot without gross deformities. Neurologic: Neurovascular status are within normal.   Assessment: Painful porokeratosis under 4th MPJ left. Multiple pinch callus around the great toe bilateral. Thick dystrophic mycotic nail both great toes.  Plan: All lesions and abnormal nails debrided. Return in 3 months or sooner if left foot lesion becomes painful.

## 2016-04-25 ENCOUNTER — Other Ambulatory Visit: Payer: Self-pay | Admitting: Family Medicine

## 2016-05-10 ENCOUNTER — Ambulatory Visit (INDEPENDENT_AMBULATORY_CARE_PROVIDER_SITE_OTHER): Payer: Managed Care, Other (non HMO) | Admitting: Family Medicine

## 2016-05-10 ENCOUNTER — Encounter: Payer: Self-pay | Admitting: Family Medicine

## 2016-05-10 ENCOUNTER — Ambulatory Visit (INDEPENDENT_AMBULATORY_CARE_PROVIDER_SITE_OTHER): Payer: Managed Care, Other (non HMO)

## 2016-05-10 VITALS — BP 115/73 | HR 62 | Temp 97.3°F | Ht 70.0 in | Wt 226.0 lb

## 2016-05-10 DIAGNOSIS — J069 Acute upper respiratory infection, unspecified: Secondary | ICD-10-CM | POA: Diagnosis not present

## 2016-05-10 DIAGNOSIS — M79641 Pain in right hand: Secondary | ICD-10-CM

## 2016-05-10 DIAGNOSIS — N400 Enlarged prostate without lower urinary tract symptoms: Secondary | ICD-10-CM | POA: Diagnosis not present

## 2016-05-10 DIAGNOSIS — M79642 Pain in left hand: Secondary | ICD-10-CM

## 2016-05-10 DIAGNOSIS — I10 Essential (primary) hypertension: Secondary | ICD-10-CM

## 2016-05-10 DIAGNOSIS — L2089 Other atopic dermatitis: Secondary | ICD-10-CM | POA: Diagnosis not present

## 2016-05-10 DIAGNOSIS — E7800 Pure hypercholesterolemia, unspecified: Secondary | ICD-10-CM | POA: Diagnosis not present

## 2016-05-10 DIAGNOSIS — E559 Vitamin D deficiency, unspecified: Secondary | ICD-10-CM | POA: Diagnosis not present

## 2016-05-10 DIAGNOSIS — R5383 Other fatigue: Secondary | ICD-10-CM

## 2016-05-10 MED ORDER — AMOXICILLIN 500 MG PO CAPS: 500.0000 mg | ORAL_CAPSULE | Freq: Three times a day (TID) | ORAL | 0 refills | Status: DC

## 2016-05-10 NOTE — Patient Instructions (Addendum)
Continue current medications. Continue good therapeutic lifestyle changes which include good diet and exercise. Fall precautions discussed with patient. If an FOBT was given today- please return it to our front desk. If you are over 58 years old - you may need Prevnar 13 or the adult Pneumonia vaccine.  **Flu shots are available--- please call and schedule a FLU-CLINIC appointment**  After your visit with us today you will receive a survey in the mail or online from [employer] regarding your care with us. Please take a moment to fill this out. Your feedback is very important to us as you can help us better understand your patient needs as well as improve your experience and satisfaction. WE CARE ABOUT YOU!!!   Try to reduce the Voltaren are diclofenac to once daily after breakfast Try to reduce the omeprazole to once daily before breakfast If you need additional medication for heartburn take ranitidine 150 mg over-the-counter one daily before supper only if needed We will get lab work back and try to decide about your arthritic complaints in your hands. We will call with the x-rays of the hands as soon as those results become available Remember to use scent free fabric softeners detergents and soaps. For a week try cortisone 10 sparingly to the areas on each temple that are discolored, only for 1 week.

## 2016-05-10 NOTE — Addendum Note (Signed)
Addended by: Magdalene RiverBULLINS, Mailey Landstrom H on: 05/10/2016 04:52 PM   Modules accepted: Orders

## 2016-05-10 NOTE — Progress Notes (Signed)
Subjective:    Patient ID: Jacob Cline, male    DOB: Feb 24, 1961, 56 y.o.   MRN: [id number]  HPI Pt here for follow up and management of chronic medical problems which includes hyperlipidemia and hypertension. He is taking medications regularly.The patient today complains of some pain in the right calf as well as sinus congestion. He will return to the office fasting for his lab work. He will be given an FOBT today. The patient has had right hip replacement in the past and is now having problems with his left hip. He did see the gastroenterologist in the past and had an endoscopy. Endoscopy was done and there was no problems found with reflux even though the patient is having symptoms with that because he has discomfort in his throat and neck. He is seen an ear nose and throat specialist no problem was found from their perspective either. He does continue to take diclofenac 75 mg twice daily because of back pain and hand pain and left hip pain. He understands it this can irritate his stomach and that we need to reduce this if possible so that he can reduce his omeprazole from 40 mg twice a day to 40 mg once a day. His also had sinus congestion for 2 weeks which seems to affect his head and chest. In the past he has been to see the rheumatologist and he is concerned that he might have rheumatoid arthritis because all the hand pain and swelling. This is certainly reasonable to look into this further also. We will get an x-ray of his hands and we will include in the blood work and anti-CCP and arthritis panel.     Patient Active Problem List   Diagnosis Date Noted  . Degenerative arthritis of hip 10/28/2014  . Metatarsalgia of both feet 10/24/2014  . Porokeratosis 08/07/2013  . Pain in lower limb 08/07/2013  . Onychomycosis 08/07/2013  . Chest discomfort 05/28/2013  . Hypertension 01/30/2013  . Hyperlipidemia 01/30/2013  . Hiatal hernia with gastroesophageal reflux 01/30/2013  . BPH (benign  prostatic hyperplasia) 01/30/2013  . Anxiety state, unspecified, history of 01/30/2013  . Low back pain 01/30/2013  . Erectile dysfunction 01/30/2013   Outpatient Encounter Prescriptions as of 05/10/2016  Medication Sig  . amLODipine (NORVASC) 5 MG tablet Take 1 tablet (5 mg total) by mouth daily.  [name] aspirin 81 MG chewable tablet Chew 81 mg by mouth daily.    . cholecalciferol (VITAMIN D) 1000 UNITS tablet Take 1,000 Units by mouth daily. 3 tablets daily  . Cinnamon 500 MG capsule 500 mg.  . diclofenac (VOLTAREN) 75 MG EC tablet TAKE ONE TABLET BY MOUTH TWICE DAILY  . fluticasone (FLONASE) 50 MCG/ACT nasal spray Place 2 sprays into both nostrils daily.  . Multiple Vitamin (MULTIVITAMIN) tablet 1 tablet.  . Omega-3 Fatty Acids (FISH OIL) 1000 MG CAPS 1 capsule.  [name] omeprazole (PRILOSEC) 20 MG capsule Take 1 capsule (20 mg total) by mouth daily. (Patient taking differently: Take 20 mg by mouth 2 (two) times daily before a meal. )  . PARoxetine (PAXIL CR) 12.5 MG 24 hr tablet Take 1 tablet (12.5 mg total) by mouth daily.  . pravastatin (PRAVACHOL) 40 MG tablet Take 1 tablet (40 mg total) by mouth at bedtime.  . triamterene-hydrochlorothiazide (MAXZIDE-25) 37.5-25 MG tablet Take 1 tablet by mouth daily.  . TURMERIC PO Take by mouth daily.  . vardenafil (LEVITRA) 20 MG tablet Take 20 mg by mouth daily.     No  facility-administered encounter medications on file as of 05/10/2016.      Review of Systems  Constitutional: Negative.   HENT: Positive for congestion (chest) and sinus pressure.   Eyes: Negative.   Respiratory: Negative.   Cardiovascular: Negative.   Gastrointestinal: Negative.   Endocrine: Negative.   Genitourinary: Negative.   Musculoskeletal: Positive for myalgias (right posterior calf pain at times).  Skin: Negative.   Allergic/Immunologic: Negative.   Neurological: Negative.   Hematological: Negative.   Psychiatric/Behavioral: Negative.        Objective:   Physical Exam    Constitutional: He is oriented to person, place, and time. He appears well-developed and well-nourished. No distress.  HENT:  Head: Normocephalic and atraumatic.  Right Ear: External ear normal.  Left Ear: External ear normal.  Mouth/Throat: Oropharynx is clear and moist. No oropharyngeal exudate.  Head congestion and nasal congestion bilaterally  Eyes: Conjunctivae and EOM are normal. Pupils are equal, round, and reactive to light. Right eye exhibits no discharge. Left eye exhibits no discharge. No scleral icterus.  Neck: Normal range of motion. Neck supple. No thyromegaly present.  No bruits thyromegaly or anterior cervical adenopathy  Cardiovascular: Normal rate, regular rhythm, normal heart sounds and intact distal pulses.   No murmur heard. Heart is regular at 60/m  Pulmonary/Chest: Effort normal and breath sounds normal. No respiratory distress. He has no wheezes. He has no rales. He exhibits no tenderness.  Minimal congestion with coughing in the upper airway and no rales or wheezes  Abdominal: Soft. Bowel sounds are normal. He exhibits no mass. There is no tenderness. There is no rebound and no guarding.  Musculoskeletal: Normal range of motion. He exhibits no edema.  Both hands were tender to palpation except the right hand was more swollen at the MP joints of the third finger. He has good flexion and extension of the hip with abduction. I was unable to elicit any kind of back pain or hip pain with movements of his lower extremity.  Lymphadenopathy:    He has no cervical adenopathy.  Neurological: He is alert and oriented to person, place, and time. He has normal reflexes. No cranial nerve deficit.  Skin: Skin is warm and dry. No rash noted.  Psychiatric: He has a normal mood and affect. His behavior is normal. Judgment and thought content normal.  Nursing note and vitals reviewed.    BP 115/73 (BP Location: Left Arm)   Pulse 62   Temp 97.3 F (36.3 C) (Oral)   Ht 5' 10"   (1.778 m)   Wt 226 lb (102.5 kg)   BMI 32.43 kg/m        Assessment & Plan:  1. Pure hypercholesterolemia -Continue current treatment pending results of lab work - CBC with Differential/Platelet; Future - NMR, lipoprofile; Future  2. Essential hypertension -The blood pressure is good today and he will continue current treatment and sodium restriction - BMP8+EGFR; Future - CBC with Differential/Platelet; Future - Hepatic function panel; Future  3. Benign prostatic hyperplasia, unspecified whether lower urinary tract symptoms present -No complaints with voiding. - CBC with Differential/Platelet; Future  4. Vitamin D deficiency -Continue current treatment pending results of lab work - CBC with Differential/Platelet; Future - VITAMIN D 25 Hydroxy (Vit-D Deficiency, Fractures); Future  5. Bilateral hand pain -Arthritis profile plus anti-CCP - CYCLIC CITRUL PEPTIDE ANTIBODY, IGG/IGA; Future - DG Hand Complete Left; Future - DG Hand Complete Right; Future  6. Other fatigue - Thyroid Panel With TSH; Future  7. Other atopic  dermatitis -Use scent free soaps fabric softeners and detergents and try for one week a small amount of cortisone 10 bilaterally to the areas of discoloration.  8. Upper respiratory tract infection, unspecified type -Take amoxicillin 500 3 times a day until completed for 10 days and continue to use Mucinex maximum strength, blue and white in color, 1 twice daily with a large glass of water for cough and congestion. Continue to use nasal saline and Flonase. -Drink plenty of fluids and stay well hydrated. -Avoid using an overhead fan. -Use a cool mist humidifier.  No orders of the defined types were placed in this encounter.  Patient Instructions  Continue current medications. Continue good therapeutic lifestyle changes which include good diet and exercise. Fall precautions discussed with patient. If an FOBT was given today- please return it to our front  desk. If you are over 51 years old - you may need Prevnar 42 or the adult Pneumonia vaccine.  **Flu shots are available--- please call and schedule a FLU-CLINIC appointment**  After your visit with [place] today you will receive a survey in the mail or online from [employer] regarding your care with [place]. Please take a moment to fill this out. Your feedback is very important to [place] as you can help [place] better understand your patient needs as well as improve your experience and satisfaction. WE CARE ABOUT YOU!!!   Try to reduce the Voltaren are diclofenac to once daily after breakfast Try to reduce the omeprazole to once daily before breakfast If you need additional medication for heartburn take ranitidine 150 mg over-the-counter one daily before supper only if needed We will get lab work back and try to decide about your arthritic complaints in your hands. We will call with the x-rays of the hands as soon as those results become available Remember to use scent free fabric softeners detergents and soaps. For a week try cortisone 10 sparingly to the areas on each temple that are discolored, only for 1 week.  Arrie Senate MD

## 2016-05-11 ENCOUNTER — Other Ambulatory Visit: Payer: Managed Care, Other (non HMO)

## 2016-05-11 DIAGNOSIS — M79641 Pain in right hand: Secondary | ICD-10-CM

## 2016-05-11 DIAGNOSIS — I10 Essential (primary) hypertension: Secondary | ICD-10-CM

## 2016-05-11 DIAGNOSIS — E7800 Pure hypercholesterolemia, unspecified: Secondary | ICD-10-CM

## 2016-05-11 DIAGNOSIS — E559 Vitamin D deficiency, unspecified: Secondary | ICD-10-CM

## 2016-05-11 DIAGNOSIS — R5383 Other fatigue: Secondary | ICD-10-CM

## 2016-05-11 DIAGNOSIS — N400 Enlarged prostate without lower urinary tract symptoms: Secondary | ICD-10-CM

## 2016-05-11 DIAGNOSIS — M79642 Pain in left hand: Secondary | ICD-10-CM

## 2016-05-12 LAB — NMR, LIPOPROFILE
CHOLESTEROL: 177 mg/dL (ref 100–199)
HDL Cholesterol by NMR: 42 mg/dL (ref >39)
HDL PARTICLE NUMBER: 30 umol/L — AB (ref >=30.5)
LDL PARTICLE NUMBER: 1447 nmol/L — AB (ref <1000)
LDL Size: 20.5 nm (ref >20.5)
LDL-C: 114 mg/dL — ABNORMAL HIGH (ref 0–99)
LP-IR Score: 60 — ABNORMAL HIGH (ref <=45)
Small LDL Particle Number: 818 nmol/L — ABNORMAL HIGH (ref <=527)
TRIGLYCERIDES BY NMR: 107 mg/dL (ref 0–149)

## 2016-05-12 LAB — CYCLIC CITRUL PEPTIDE ANTIBODY, IGG/IGA: Cyclic Citrullin Peptide Ab: 6 units (ref 0–19)

## 2016-05-12 LAB — VITAMIN D 25 HYDROXY (VIT D DEFICIENCY, FRACTURES): Vit D, 25-Hydroxy: 48.3 ng/mL (ref 30.0–100.0)

## 2016-05-12 LAB — CBC WITH DIFFERENTIAL/PLATELET
Basophils Absolute: 0 10*3/uL (ref 0.0–0.2)
Basos: 1 %
EOS (ABSOLUTE): 0.1 10*3/uL (ref 0.0–0.4)
EOS: 2 %
HEMATOCRIT: 44.9 % (ref 37.5–51.0)
Hemoglobin: 15.4 g/dL (ref 13.0–17.7)
IMMATURE GRANS (ABS): 0 10*3/uL (ref 0.0–0.1)
Immature Granulocytes: 0 %
LYMPHS ABS: 1.6 10*3/uL (ref 0.7–3.1)
Lymphs: 36 %
MCH: 30.1 pg (ref 26.6–33.0)
MCHC: 34.3 g/dL (ref 31.5–35.7)
MCV: 88 fL (ref 79–97)
MONOCYTES: 12 %
Monocytes Absolute: 0.6 10*3/uL (ref 0.1–0.9)
NEUTROS ABS: 2.2 10*3/uL (ref 1.4–7.0)
Neutrophils: 49 %
Platelets: 184 10*3/uL (ref 150–379)
RBC: 5.12 x10E6/uL (ref 4.14–5.80)
RDW: 14.6 % (ref 12.3–15.4)
WBC: 4.5 10*3/uL (ref 3.4–10.8)

## 2016-05-12 LAB — THYROID PANEL WITH TSH
Free Thyroxine Index: 1.7 (ref 1.2–4.9)
T3 Uptake Ratio: 24 % (ref 24–39)
T4 TOTAL: 7.2 ug/dL (ref 4.5–12.0)
TSH: 1.98 u[IU]/mL (ref 0.450–4.500)

## 2016-05-12 LAB — BMP8+EGFR
BUN / CREAT RATIO: 26 — AB (ref 9–20)
BUN: 24 mg/dL (ref 6–24)
CO2: 29 mmol/L (ref 18–29)
CREATININE: 0.92 mg/dL (ref 0.76–1.27)
Calcium: 9.1 mg/dL (ref 8.7–10.2)
Chloride: 100 mmol/L (ref 96–106)
GFR calc Af Amer: 106 mL/min/{1.73_m2} (ref >59)
GFR, EST NON AFRICAN AMERICAN: 92 mL/min/{1.73_m2} (ref >59)
Glucose: 102 mg/dL — ABNORMAL HIGH (ref 65–99)
Potassium: 3.9 mmol/L (ref 3.5–5.2)
Sodium: 144 mmol/L (ref 134–144)

## 2016-05-12 LAB — HEPATIC FUNCTION PANEL
ALBUMIN: 4.1 g/dL (ref 3.5–5.5)
ALT: 39 IU/L (ref 0–44)
AST: 28 IU/L (ref 0–40)
Alkaline Phosphatase: 88 IU/L (ref 39–117)
BILIRUBIN TOTAL: 0.5 mg/dL (ref 0.0–1.2)
Bilirubin, Direct: 0.14 mg/dL (ref 0.00–0.40)
TOTAL PROTEIN: 6.8 g/dL (ref 6.0–8.5)

## 2016-05-13 ENCOUNTER — Other Ambulatory Visit: Payer: Self-pay | Admitting: *Deleted

## 2016-05-13 DIAGNOSIS — M79641 Pain in right hand: Secondary | ICD-10-CM

## 2016-05-13 DIAGNOSIS — M79642 Pain in left hand: Secondary | ICD-10-CM

## 2016-05-18 ENCOUNTER — Ambulatory Visit (INDEPENDENT_AMBULATORY_CARE_PROVIDER_SITE_OTHER): Payer: Managed Care, Other (non HMO) | Admitting: Family Medicine

## 2016-05-18 ENCOUNTER — Encounter: Payer: Self-pay | Admitting: Family Medicine

## 2016-05-18 VITALS — BP 116/69 | HR 70 | Temp 98.9°F | Ht 70.0 in | Wt 227.0 lb

## 2016-05-18 DIAGNOSIS — J40 Bronchitis, not specified as acute or chronic: Secondary | ICD-10-CM | POA: Diagnosis not present

## 2016-05-18 DIAGNOSIS — J329 Chronic sinusitis, unspecified: Secondary | ICD-10-CM

## 2016-05-18 MED ORDER — PSEUDOEPHEDRINE-GUAIFENESIN ER 120-1200 MG PO TB12: 1.0000 | ORAL_TABLET | Freq: Two times a day (BID) | ORAL | 0 refills | Status: DC

## 2016-05-18 MED ORDER — AMOXICILLIN-POT CLAVULANATE 875-125 MG PO TABS: 1.0000 | ORAL_TABLET | Freq: Two times a day (BID) | ORAL | 0 refills | Status: DC

## 2016-05-18 MED ORDER — BETAMETHASONE SOD PHOS & ACET 6 (3-3) MG/ML IJ SUSP
6.0000 mg | Freq: Once | INTRAMUSCULAR | Status: AC
  Administered 2016-05-18: 6 mg via INTRAMUSCULAR

## 2016-05-18 NOTE — Progress Notes (Signed)
Subjective:  Patient ID: Jacob Cline, male    DOB: 1960-07-09  Age: 56 y.o. MRN: [id number]  CC: Sore Throat (pt c/o sore throat, congestion, sinus pressure, is still on antibiotic from last week that [facility] gave him.)   HPI Jacob CaneJames H Friis presents for Patient presents with upper respiratory congestion. Rhinorrhea that is frequently purulent. There is moderate sore throat. Patient reports coughing frequently as well.-colored/purulent sputum noted. There is no fever no chills no sweats. The patient denies being short of breath. Onset was  Over 1 week ago. Gradually worsening in spite ofprescription amoxil 500 and Mucinex DM   History Fayrene FearingJames has a past medical history of BPH (benign prostatic hypertrophy); Hemorrhoids; Hiatal hernia; Hyperlipidemia; Hypertension; Irregular heart beat; Obesity; and Post-operative nausea and vomiting.   He has a past surgical history that includes athroscopy of rt knee (02/02); henia - umbilical surgery (07/17/07); Vasectomy; Mouth surgery; and Hip surgery (11/2014).   His family history includes Diabetes in his brother, brother, and mother; Scleroderma in his sister.He reports that he has never smoked. He has never used smokeless tobacco. He reports that he does not drink alcohol or use drugs.  Current Outpatient Prescriptions on File Prior to Visit  Medication Sig Dispense Refill  . amLODipine (NORVASC) 5 MG tablet Take 1 tablet (5 mg total) by mouth daily. 90 tablet 3  . aspirin 81 MG chewable tablet Chew 81 mg by mouth daily.      . cholecalciferol (VITAMIN D) 1000 UNITS tablet Take 1,000 Units by mouth daily. 3 tablets daily    . Cinnamon 500 MG capsule 500 mg.    . diclofenac (VOLTAREN) 75 MG EC tablet TAKE ONE TABLET BY MOUTH TWICE DAILY 60 tablet 0  . fluticasone (FLONASE) 50 MCG/ACT nasal spray Place 2 sprays into both nostrils daily. 16 g 6  . Multiple Vitamin (MULTIVITAMIN) tablet 1 tablet.    . Omega-3 Fatty Acids (FISH OIL) 1000 MG CAPS 1 capsule.     [name]. omeprazole (PRILOSEC) 20 MG capsule Take 1 capsule (20 mg total) by mouth daily. (Patient taking differently: Take 20 mg by mouth 2 (two) times daily before a meal. ) 90 capsule 3  . PARoxetine (PAXIL CR) 12.5 MG 24 hr tablet Take 1 tablet (12.5 mg total) by mouth daily. 90 tablet 3  . pravastatin (PRAVACHOL) 40 MG tablet Take 1 tablet (40 mg total) by mouth at bedtime. 90 tablet 3  . triamterene-hydrochlorothiazide (MAXZIDE-25) 37.5-25 MG tablet Take 1 tablet by mouth daily. 90 tablet 3  . TURMERIC PO Take by mouth daily.    . vardenafil (LEVITRA) 20 MG tablet Take 20 mg by mouth daily.       No current facility-administered medications on file prior to visit.     ROS Review of Systems  Constitutional: Negative for activity change, appetite change, chills and fever.  HENT: Positive for congestion, postnasal drip, rhinorrhea and sinus pressure. Negative for ear discharge, ear pain, hearing loss, nosebleeds, sneezing and trouble swallowing.   Respiratory: Positive for cough. Negative for chest tightness and shortness of breath.   Cardiovascular: Negative for chest pain and palpitations.  Skin: Negative for rash.    Objective:  BP 116/69   Pulse 70   Temp 98.9 F (37.2 C) (Oral)   Ht 5\' 10"  (1.778 m)   Wt 227 lb (103 kg)   BMI 32.57 kg/m   Physical Exam  Constitutional: He appears well-developed and well-nourished.  HENT:  Head: Normocephalic and atraumatic.  Right Ear: Tympanic membrane and external ear normal. No decreased hearing is noted.  Left Ear: Tympanic membrane and external ear normal. No decreased hearing is noted.  Nose: Mucosal edema present. Right sinus exhibits no frontal sinus tenderness. Left sinus exhibits no frontal sinus tenderness.  Mouth/Throat: No oropharyngeal exudate or posterior oropharyngeal erythema.  Neck: No Brudzinski's sign noted.  Pulmonary/Chest: No respiratory distress. He has wheezes (few, scattered).  Lymphadenopathy:       Head (right  side): No preauricular adenopathy present.       Head (left side): No preauricular adenopathy present.       Right cervical: No superficial cervical adenopathy present.      Left cervical: No superficial cervical adenopathy present.    Assessment & Plan:   Jaise was seen today for sore throat.  Diagnoses and all orders for this visit:  Sinobronchitis -     betamethasone acetate-betamethasone sodium phosphate (CELESTONE) injection 6 mg; Inject 1 mL (6 mg total) into the muscle once.  Other orders -     amoxicillin-clavulanate (AUGMENTIN) 875-125 MG tablet; Take 1 tablet by mouth 2 (two) times daily. Take all of this medication -     Pseudoephedrine-Guaifenesin [phone] MG TB12; Take 1 tablet by mouth 2 (two) times daily. For congestion   I have discontinued Mr. Botero amoxicillin. I am also having him start on amoxicillin-clavulanate and Pseudoephedrine-Guaifenesin. Additionally, I am having him maintain his aspirin, vardenafil, Cinnamon, Fish Oil, multivitamin, cholecalciferol, TURMERIC PO, amLODipine, omeprazole, pravastatin, triamterene-hydrochlorothiazide, fluticasone, PARoxetine, and diclofenac. We will continue to administer betamethasone acetate-betamethasone sodium phosphate.  Meds ordered this encounter  Medications  . amoxicillin-clavulanate (AUGMENTIN) 875-125 MG tablet    Sig: Take 1 tablet by mouth 2 (two) times daily. Take all of this medication    Dispense:  20 tablet    Refill:  0  . Pseudoephedrine-Guaifenesin [phone] MG TB12    Sig: Take 1 tablet by mouth 2 (two) times daily. For congestion    Dispense:  20 each    Refill:  0  . betamethasone acetate-betamethasone sodium phosphate (CELESTONE) injection 6 mg     Follow-up: Return if symptoms worsen or fail to improve.  Mechele Claude, M.D.

## 2016-05-18 NOTE — Patient Instructions (Signed)
Discontinue the amoxicillin

## 2016-05-30 ENCOUNTER — Telehealth: Payer: Self-pay | Admitting: Family Medicine

## 2016-06-07 ENCOUNTER — Ambulatory Visit: Payer: Managed Care, Other (non HMO) | Admitting: Podiatry

## 2016-07-16 ENCOUNTER — Other Ambulatory Visit: Payer: Self-pay | Admitting: Family Medicine

## 2016-08-10 ENCOUNTER — Encounter: Payer: Self-pay | Admitting: *Deleted

## 2016-08-22 ENCOUNTER — Telehealth: Payer: Self-pay | Admitting: *Deleted

## 2016-08-22 ENCOUNTER — Other Ambulatory Visit: Payer: Self-pay | Admitting: *Deleted

## 2016-08-22 ENCOUNTER — Telehealth: Payer: Self-pay | Admitting: Family Medicine

## 2016-08-22 MED ORDER — DOXYCYCLINE HYCLATE 100 MG PO TABS: 100.0000 mg | ORAL_TABLET | Freq: Two times a day (BID) | ORAL | 0 refills | Status: DC

## 2016-08-22 NOTE — Telephone Encounter (Signed)
Doxycycline sent to pharmacy per Dr. Christell Constant.  Wife aware patient is to take it twice daily with food for 3 weeks

## 2016-08-22 NOTE — Telephone Encounter (Signed)
Patient is going to wait until appt with Dr. Christell Constant to discuss further.  No symptoms at this time.  Patient found tick on head Wednesday 02/16/2017.

## 2016-09-06 ENCOUNTER — Ambulatory Visit: Payer: Managed Care, Other (non HMO) | Admitting: Family Medicine

## 2016-09-08 ENCOUNTER — Other Ambulatory Visit: Payer: Managed Care, Other (non HMO)

## 2016-09-08 DIAGNOSIS — Z1211 Encounter for screening for malignant neoplasm of colon: Secondary | ICD-10-CM

## 2016-09-09 ENCOUNTER — Ambulatory Visit: Payer: Managed Care, Other (non HMO) | Admitting: Family Medicine

## 2016-09-10 LAB — FECAL OCCULT BLOOD, IMMUNOCHEMICAL: FECAL OCCULT BLD: NEGATIVE

## 2016-09-11 ENCOUNTER — Other Ambulatory Visit: Payer: Self-pay | Admitting: Family Medicine

## 2016-09-14 ENCOUNTER — Ambulatory Visit: Payer: Managed Care, Other (non HMO) | Admitting: Family Medicine

## 2016-09-21 ENCOUNTER — Other Ambulatory Visit: Payer: Self-pay | Admitting: Family Medicine

## 2016-10-03 ENCOUNTER — Other Ambulatory Visit: Payer: Self-pay | Admitting: Family Medicine

## 2016-10-10 ENCOUNTER — Ambulatory Visit (INDEPENDENT_AMBULATORY_CARE_PROVIDER_SITE_OTHER): Payer: Managed Care, Other (non HMO) | Admitting: Family Medicine

## 2016-10-10 ENCOUNTER — Encounter: Payer: Self-pay | Admitting: Family Medicine

## 2016-10-10 VITALS — BP 119/80 | HR 68 | Temp 97.4°F | Ht 70.0 in | Wt 230.0 lb

## 2016-10-10 DIAGNOSIS — L989 Disorder of the skin and subcutaneous tissue, unspecified: Secondary | ICD-10-CM

## 2016-10-10 DIAGNOSIS — K219 Gastro-esophageal reflux disease without esophagitis: Secondary | ICD-10-CM

## 2016-10-10 DIAGNOSIS — I10 Essential (primary) hypertension: Secondary | ICD-10-CM

## 2016-10-10 DIAGNOSIS — R638 Other symptoms and signs concerning food and fluid intake: Secondary | ICD-10-CM | POA: Insufficient documentation

## 2016-10-10 DIAGNOSIS — E7800 Pure hypercholesterolemia, unspecified: Secondary | ICD-10-CM | POA: Diagnosis not present

## 2016-10-10 DIAGNOSIS — E559 Vitamin D deficiency, unspecified: Secondary | ICD-10-CM

## 2016-10-10 DIAGNOSIS — M79642 Pain in left hand: Secondary | ICD-10-CM

## 2016-10-10 DIAGNOSIS — M79641 Pain in right hand: Secondary | ICD-10-CM | POA: Diagnosis not present

## 2016-10-10 DIAGNOSIS — K449 Diaphragmatic hernia without obstruction or gangrene: Secondary | ICD-10-CM

## 2016-10-10 NOTE — Progress Notes (Signed)
Subjective:    Patient ID: Jacob Cline, male    DOB: 04/26/1961, 56 y.o.   MRN: [id number]  HPI Mr. Brodzinski is here today for a 4 month follow up on hypertension, hyperlipidemia, and GERD with no complaintsThe patient is doing well overall. He has no complaints today. He will get lab work today. The patient is doing well overall he continues to work the same job and has to get up at 2:00 in the morning goes to bed at 8:00 at night. He is currently not taking amlodipine. The patient denies any chest pain or shortness of breath. He denies any trouble with nausea vomiting diarrhea blood in the stool or black tarry bowel movements. He denies any trouble with passing his water including burning pain or frequency. His mother died from complications of a hip fracture with pulmonary embolus at 58 years of age. He has a older brother has rheumatoid arthritis and another brother that has some dementia and arthritis. The patient will get lab work today. He has a history of anxiety and depression and periodically weans off of his Paxil and then periodically restarts it depending on his emotional status. Currently he is taking his Paxil fairly regularly. He is not taking amlodipine. The patient does complain of some arthralgias in his hands from all his work for the past years. He is also seeing a dermatologist next week because of a skin lesion in his scalp.       Patient Active Problem List   Diagnosis Date Noted  . Degenerative arthritis of hip 10/28/2014  . Metatarsalgia of both feet 10/24/2014  . Porokeratosis 08/07/2013  . Pain in lower limb 08/07/2013  . Onychomycosis 08/07/2013  . Chest discomfort 05/28/2013  . Hypertension 01/30/2013  . Hyperlipidemia 01/30/2013  . Hiatal hernia with gastroesophageal reflux 01/30/2013  . BPH (benign prostatic hyperplasia) 01/30/2013  . Anxiety state, unspecified, history of 01/30/2013  . Low back pain 01/30/2013  . Erectile dysfunction 01/30/2013    Outpatient Encounter Prescriptions as of 10/10/2016  Medication Sig  . amLODipine (NORVASC) 5 MG tablet Take 1 tablet (5 mg total) by mouth daily.  [name] aspirin 81 MG chewable tablet Chew 81 mg by mouth daily.    . cholecalciferol (VITAMIN D) 1000 UNITS tablet Take 1,000 Units by mouth daily. 3 tablets daily  . Cinnamon 500 MG capsule 500 mg.  . diclofenac (VOLTAREN) 75 MG EC tablet TAKE ONE TABLET BY MOUTH TWICE DAILY  . doxycycline (VIBRA-TABS) 100 MG tablet Take 1 tablet (100 mg total) by mouth 2 (two) times daily. 1 po bid  . fluticasone (FLONASE) 50 MCG/ACT nasal spray Place 2 sprays into both nostrils daily.  . Multiple Vitamin (MULTIVITAMIN) tablet 1 tablet.  . Omega-3 Fatty Acids (FISH OIL) 1000 MG CAPS 1 capsule.  [name] omeprazole (PRILOSEC) 20 MG capsule Take 1 capsule (20 mg total) by mouth daily. (Patient taking differently: Take 20 mg by mouth 2 (two) times daily before a meal. )  . PARoxetine (PAXIL-CR) 12.5 MG 24 hr tablet TAKE ONE TABLET BY MOUTH ONCE DAILY  . pravastatin (PRAVACHOL) 40 MG tablet TAKE ONE TABLET BY MOUTH AT BEDTIME  . Pseudoephedrine-Guaifenesin [phone] MG TB12 Take 1 tablet by mouth 2 (two) times daily. For congestion  . triamterene-hydrochlorothiazide (MAXZIDE-25) 37.5-25 MG tablet TAKE ONE TABLET BY MOUTH ONCE DAILY  . TURMERIC PO Take by mouth daily.  . vardenafil (LEVITRA) 20 MG tablet Take 20 mg by mouth daily.    . [DISCONTINUED] amoxicillin-clavulanate (AUGMENTIN)  875-125 MG tablet Take 1 tablet by mouth 2 (two) times daily. Take all of this medication (Patient not taking: Reported on 10/10/2016)   No facility-administered encounter medications on file as of 10/10/2016.      Review of Systems  Constitutional: Negative.   HENT: Negative.   Eyes: Negative.   Respiratory: Negative.   Cardiovascular: Negative.   Gastrointestinal: Negative.   Endocrine: Negative.   Genitourinary: Negative.   Musculoskeletal: Negative.   Skin: Negative.    Allergic/Immunologic: Negative.   Neurological: Negative.   Hematological: Negative.   Psychiatric/Behavioral: Negative.        Objective:   Physical Exam  Constitutional: He is oriented to person, place, and time. He appears well-developed and well-nourished. No distress.  The patient is pleasant and alert.  HENT:  Head: Normocephalic and atraumatic.  Right Ear: External ear normal.  Left Ear: External ear normal.  Nose: Nose normal.  Mouth/Throat: Oropharynx is clear and moist. No oropharyngeal exudate.  Eyes: Conjunctivae and EOM are normal. Pupils are equal, round, and reactive to light. Right eye exhibits no discharge. Left eye exhibits no discharge. No scleral icterus.  Neck: Normal range of motion. Neck supple. No thyromegaly present.  Cardiovascular: Normal rate, regular rhythm, normal heart sounds and intact distal pulses.   No murmur heard. The heart is regular at 72/m  Pulmonary/Chest: Effort normal and breath sounds normal. No respiratory distress. He has no wheezes. He has no rales. He exhibits no tenderness.  Clear anteriorly and posteriorly  Abdominal: Soft. Bowel sounds are normal. He exhibits no mass. There is no tenderness. There is no rebound and no guarding.  Abdominal obesity without masses tenderness or organ enlargement or bruits  Musculoskeletal: Normal range of motion. He exhibits no edema.  Some stiffness in hands but fairly good grip  Lymphadenopathy:    He has no cervical adenopathy.  Neurological: He is alert and oriented to person, place, and time. He has normal reflexes. No cranial nerve deficit.  Skin: Skin is warm and dry. No rash noted.  Psychiatric: He has a normal mood and affect. His behavior is normal. Judgment and thought content normal.  Nursing note and vitals reviewed.   BP 119/80   Pulse 68   Temp 97.4 F (36.3 C) (Oral)   Ht '5\' 10"'$  (1.778 m)   Wt 230 lb (104.3 kg)   BMI 33.00 kg/m          Assessment & Plan:  1. Essential  hypertension -The blood pressure is good today and he will continue his current treatment regimen and sodium restriction - BMP8+EGFR - CBC with Differential/Platelet - Hepatic function panel - Lipid panel - VITAMIN D 25 Hydroxy (Vit-D Deficiency, Fractures)  2. Pure hypercholesterolemia -Continue with current treatment pending results of lab work - BMP8+EGFR - CBC with Differential/Platelet - Hepatic function panel - Lipid panel - VITAMIN D 25 Hydroxy (Vit-D Deficiency, Fractures)  3. Hiatal hernia with gastroesophageal reflux -Continue to watch diet closely and keep weight down as much as possible - BMP8+EGFR - CBC with Differential/Platelet - VITAMIN D 25 Hydroxy (Vit-D Deficiency, Fractures)  4. Bilateral hand pain -Take diclofenac as needed and as little as possible  5. Vitamin D deficiency -Continue current treatment pending results of lab work  6. Increased BMI -Make every effort at weight loss through diet and exercise  7. Scalp lesion -Follow-up with dermatology as planned  Patient Instructions  Follow-up with dermatology as planned Follow-up with ophthalmology as planned Continue to monitor blood  pressures at home and watch sodium intake and do all possible efforts at keeping weight down and watching diet Return to the office for fasting lab work   Arrie Senate MD

## 2016-10-10 NOTE — Patient Instructions (Addendum)
Follow-up with dermatology as planned Follow-up with ophthalmology as planned Continue to monitor blood pressures at home and watch sodium intake and do all possible efforts at keeping weight down and watching diet Return to the office for fasting lab work

## 2016-10-17 ENCOUNTER — Other Ambulatory Visit: Payer: Managed Care, Other (non HMO)

## 2016-10-18 LAB — LIPID PANEL
CHOL/HDL RATIO: 3.6 ratio (ref 0.0–5.0)
Cholesterol, Total: 164 mg/dL (ref 100–199)
HDL: 46 mg/dL (ref >39)
LDL Calculated: 93 mg/dL (ref 0–99)
Triglycerides: 126 mg/dL (ref 0–149)
VLDL CHOLESTEROL CAL: 25 mg/dL (ref 5–40)

## 2016-10-18 LAB — HEPATIC FUNCTION PANEL
ALBUMIN: 4.1 g/dL (ref 3.5–5.5)
ALT: 36 IU/L (ref 0–44)
AST: 24 IU/L (ref 0–40)
Alkaline Phosphatase: 89 IU/L (ref 39–117)
BILIRUBIN TOTAL: 0.3 mg/dL (ref 0.0–1.2)
BILIRUBIN, DIRECT: 0.09 mg/dL (ref 0.00–0.40)
Total Protein: 6.6 g/dL (ref 6.0–8.5)

## 2016-10-18 LAB — CBC WITH DIFFERENTIAL/PLATELET
BASOS: 0 %
Basophils Absolute: 0 10*3/uL (ref 0.0–0.2)
EOS (ABSOLUTE): 0.1 10*3/uL (ref 0.0–0.4)
EOS: 2 %
HEMATOCRIT: 45.9 % (ref 37.5–51.0)
Hemoglobin: 15.5 g/dL (ref 13.0–17.7)
IMMATURE GRANS (ABS): 0 10*3/uL (ref 0.0–0.1)
IMMATURE GRANULOCYTES: 0 %
LYMPHS: 31 %
Lymphocytes Absolute: 1.8 10*3/uL (ref 0.7–3.1)
MCH: 30 pg (ref 26.6–33.0)
MCHC: 33.8 g/dL (ref 31.5–35.7)
MCV: 89 fL (ref 79–97)
Monocytes Absolute: 0.4 10*3/uL (ref 0.1–0.9)
Monocytes: 7 %
NEUTROS PCT: 60 %
Neutrophils Absolute: 3.7 10*3/uL (ref 1.4–7.0)
PLATELETS: 199 10*3/uL (ref 150–379)
RBC: 5.16 x10E6/uL (ref 4.14–5.80)
RDW: 13.8 % (ref 12.3–15.4)
WBC: 6 10*3/uL (ref 3.4–10.8)

## 2016-10-18 LAB — BMP8+EGFR
BUN/Creatinine Ratio: 26 — ABNORMAL HIGH (ref 9–20)
BUN: 22 mg/dL (ref 6–24)
CALCIUM: 9.1 mg/dL (ref 8.7–10.2)
CO2: 26 mmol/L (ref 20–29)
CREATININE: 0.86 mg/dL (ref 0.76–1.27)
Chloride: 99 mmol/L (ref 96–106)
GFR calc Af Amer: 110 mL/min/{1.73_m2} (ref >59)
GFR, EST NON AFRICAN AMERICAN: 96 mL/min/{1.73_m2} (ref >59)
Glucose: 99 mg/dL (ref 65–99)
Potassium: 4.4 mmol/L (ref 3.5–5.2)
Sodium: 138 mmol/L (ref 134–144)

## 2016-10-18 LAB — VITAMIN D 25 HYDROXY (VIT D DEFICIENCY, FRACTURES): Vit D, 25-Hydroxy: 30.7 ng/mL (ref 30.0–100.0)

## 2016-10-21 ENCOUNTER — Other Ambulatory Visit: Payer: Self-pay | Admitting: Family Medicine

## 2016-11-22 ENCOUNTER — Other Ambulatory Visit: Payer: Self-pay | Admitting: Family Medicine

## 2016-12-10 ENCOUNTER — Other Ambulatory Visit: Payer: Self-pay | Admitting: Family Medicine

## 2017-01-25 ENCOUNTER — Other Ambulatory Visit: Payer: Self-pay | Admitting: Family Medicine

## 2017-01-30 ENCOUNTER — Encounter: Payer: Self-pay | Admitting: *Deleted

## 2017-02-13 ENCOUNTER — Ambulatory Visit: Payer: Managed Care, Other (non HMO) | Admitting: Family Medicine

## 2017-02-20 ENCOUNTER — Telehealth: Payer: Self-pay | Admitting: Family Medicine

## 2017-02-20 ENCOUNTER — Other Ambulatory Visit: Payer: Self-pay | Admitting: *Deleted

## 2017-02-20 DIAGNOSIS — Z0000 Encounter for general adult medical examination without abnormal findings: Secondary | ICD-10-CM

## 2017-02-20 DIAGNOSIS — I10 Essential (primary) hypertension: Secondary | ICD-10-CM

## 2017-02-20 DIAGNOSIS — E7800 Pure hypercholesterolemia, unspecified: Secondary | ICD-10-CM

## 2017-02-20 DIAGNOSIS — E559 Vitamin D deficiency, unspecified: Secondary | ICD-10-CM

## 2017-02-20 DIAGNOSIS — N400 Enlarged prostate without lower urinary tract symptoms: Secondary | ICD-10-CM

## 2017-02-20 NOTE — Telephone Encounter (Signed)
Lab for tomorrow

## 2017-02-21 ENCOUNTER — Other Ambulatory Visit: Payer: Managed Care, Other (non HMO)

## 2017-02-21 DIAGNOSIS — N400 Enlarged prostate without lower urinary tract symptoms: Secondary | ICD-10-CM

## 2017-02-21 DIAGNOSIS — E559 Vitamin D deficiency, unspecified: Secondary | ICD-10-CM

## 2017-02-21 DIAGNOSIS — Z0000 Encounter for general adult medical examination without abnormal findings: Secondary | ICD-10-CM

## 2017-02-21 DIAGNOSIS — I10 Essential (primary) hypertension: Secondary | ICD-10-CM

## 2017-02-21 DIAGNOSIS — E7800 Pure hypercholesterolemia, unspecified: Secondary | ICD-10-CM

## 2017-02-22 ENCOUNTER — Ambulatory Visit (INDEPENDENT_AMBULATORY_CARE_PROVIDER_SITE_OTHER): Payer: Managed Care, Other (non HMO) | Admitting: Family Medicine

## 2017-02-22 ENCOUNTER — Encounter: Payer: Self-pay | Admitting: Family Medicine

## 2017-02-22 ENCOUNTER — Other Ambulatory Visit: Payer: Self-pay | Admitting: Family Medicine

## 2017-02-22 VITALS — BP 128/83 | HR 63 | Temp 97.6°F | Ht 70.0 in | Wt 235.0 lb

## 2017-02-22 DIAGNOSIS — I10 Essential (primary) hypertension: Secondary | ICD-10-CM

## 2017-02-22 DIAGNOSIS — E7800 Pure hypercholesterolemia, unspecified: Secondary | ICD-10-CM

## 2017-02-22 DIAGNOSIS — Z0000 Encounter for general adult medical examination without abnormal findings: Secondary | ICD-10-CM

## 2017-02-22 DIAGNOSIS — N400 Enlarged prostate without lower urinary tract symptoms: Secondary | ICD-10-CM

## 2017-02-22 DIAGNOSIS — E559 Vitamin D deficiency, unspecified: Secondary | ICD-10-CM

## 2017-02-22 LAB — LIPID PANEL
CHOLESTEROL TOTAL: 150 mg/dL (ref 100–199)
Chol/HDL Ratio: 3.4 ratio (ref 0.0–5.0)
HDL: 44 mg/dL (ref >39)
LDL CALC: 79 mg/dL (ref 0–99)
TRIGLYCERIDES: 137 mg/dL (ref 0–149)
VLDL Cholesterol Cal: 27 mg/dL (ref 5–40)

## 2017-02-22 LAB — CBC WITH DIFFERENTIAL/PLATELET
BASOS: 0 %
Basophils Absolute: 0 10*3/uL (ref 0.0–0.2)
EOS (ABSOLUTE): 0.1 10*3/uL (ref 0.0–0.4)
Eos: 2 %
HEMATOCRIT: 45.3 % (ref 37.5–51.0)
HEMOGLOBIN: 14.9 g/dL (ref 13.0–17.7)
IMMATURE GRANS (ABS): 0 10*3/uL (ref 0.0–0.1)
Immature Granulocytes: 0 %
LYMPHS: 33 %
Lymphocytes Absolute: 2.1 10*3/uL (ref 0.7–3.1)
MCH: 29.9 pg (ref 26.6–33.0)
MCHC: 32.9 g/dL (ref 31.5–35.7)
MCV: 91 fL (ref 79–97)
MONOCYTES: 8 %
Monocytes Absolute: 0.5 10*3/uL (ref 0.1–0.9)
NEUTROS ABS: 3.5 10*3/uL (ref 1.4–7.0)
Neutrophils: 57 %
Platelets: 198 10*3/uL (ref 150–379)
RBC: 4.99 x10E6/uL (ref 4.14–5.80)
RDW: 14.2 % (ref 12.3–15.4)
WBC: 6.2 10*3/uL (ref 3.4–10.8)

## 2017-02-22 LAB — HEPATIC FUNCTION PANEL
ALBUMIN: 4.3 g/dL (ref 3.5–5.5)
ALT: 27 IU/L (ref 0–44)
AST: 18 IU/L (ref 0–40)
Alkaline Phosphatase: 91 IU/L (ref 39–117)
BILIRUBIN TOTAL: 0.2 mg/dL (ref 0.0–1.2)
BILIRUBIN, DIRECT: 0.09 mg/dL (ref 0.00–0.40)
Total Protein: 6.8 g/dL (ref 6.0–8.5)

## 2017-02-22 LAB — BMP8+EGFR
BUN/Creatinine Ratio: 29 — ABNORMAL HIGH (ref 9–20)
BUN: 23 mg/dL (ref 6–24)
CALCIUM: 9 mg/dL (ref 8.7–10.2)
CO2: 25 mmol/L (ref 20–29)
CREATININE: 0.79 mg/dL (ref 0.76–1.27)
Chloride: 101 mmol/L (ref 96–106)
GFR calc Af Amer: 114 mL/min/{1.73_m2} (ref >59)
GFR calc non Af Amer: 99 mL/min/{1.73_m2} (ref >59)
GLUCOSE: 94 mg/dL (ref 65–99)
Potassium: 4.1 mmol/L (ref 3.5–5.2)
SODIUM: 142 mmol/L (ref 134–144)

## 2017-02-22 LAB — VITAMIN D 25 HYDROXY (VIT D DEFICIENCY, FRACTURES): VIT D 25 HYDROXY: 36.3 ng/mL (ref 30.0–100.0)

## 2017-02-22 LAB — PSA, TOTAL AND FREE
PSA FREE PCT: 18 %
PSA, Free: 0.18 ng/mL
Prostate Specific Ag, Serum: 1 ng/mL (ref 0.0–4.0)

## 2017-02-22 NOTE — Progress Notes (Signed)
Subjective:    Patient ID: Jacob Cline, male    DOB: 11-18-1960, 56 y.o.   MRN: [id number]  HPI Pt here for annual CPE and follow up on  chronic medical problems which Includes hyperlipidemia and hypertension. He is taking medication regularly.  The patient today complains of bilateral shoulder pain.  He comes in today for complete physical exam.  He will get his flu shot at work.  He will get a urinalysis here.  Recently had a closed intra-articular fracture of the distal end of the left radius.  Surgery and a plate put in and is out of work until December including the healing time and recuperation time.  He is followed by the orthopedic doctors for this.  He has had umbilical surgery hip surgery and arthroscopy of the right knee.  Has had recent lab work done and this will be reviewed with him during the visit today.  All liver function tests were normal.  The CBC had a normal white blood cell count with a good hemoglobin at 14.9.  Platelet count was adequate.  The blood sugar was good at 94.  The creatinine, was within normal limits along with all of his electrolytes including potassium.  All cholesterol numbers were excellent including the LDL C being 79.  PSA test was normal at the low end of the range.  Vitamin D level was good at 36.3 but could be somewhat higher we will asked him to increase his vitamin D replacement to 2000 units daily.  The patient denies any chest pain or shortness of breath.  He denies any trouble with his intestinal tract including nausea vomiting diarrhea blood in the stool or black tarry bowel movements.  He is passing his water without problems.  He has a cast on his left forearm.  His right hip replacement was about 2 years ago.  He had an eye exam in February subsequent follow-up exam which was normal.  He is up-to-date on his colonoscopy which was done last in December 2016 and he will not need another one until 10 years after that date.   Patient Active Problem List    Diagnosis Date Noted  . Increased BMI 10/10/2016  . Degenerative arthritis of hip 10/28/2014  . Metatarsalgia of both feet 10/24/2014  . Porokeratosis 08/07/2013  . Pain in lower limb 08/07/2013  . Onychomycosis 08/07/2013  . Chest discomfort 05/28/2013  . Hypertension 01/30/2013  . Hyperlipidemia 01/30/2013  . Hiatal hernia with gastroesophageal reflux 01/30/2013  . BPH (benign prostatic hyperplasia) 01/30/2013  . Anxiety state, unspecified, history of 01/30/2013  . Low back pain 01/30/2013  . Erectile dysfunction 01/30/2013   Outpatient Encounter Prescriptions as of 02/22/2017  Medication Sig  . amLODipine (NORVASC) 5 MG tablet TAKE ONE TABLET BY MOUTH ONCE DAILY  . aspirin 81 MG chewable tablet Chew 81 mg by mouth daily.    . cholecalciferol (VITAMIN D) 1000 UNITS tablet Take 1,000 Units by mouth daily. 3 tablets daily  . Cinnamon 500 MG capsule 500 mg.  . diclofenac (VOLTAREN) 75 MG EC tablet TAKE 1 TABLET BY MOUTH TWICE DAILY  . doxycycline (VIBRA-TABS) 100 MG tablet Take 1 tablet (100 mg total) by mouth 2 (two) times daily. 1 po bid  . fluticasone (FLONASE) 50 MCG/ACT nasal spray Place 2 sprays into both nostrils daily.  . Multiple Vitamin (MULTIVITAMIN) tablet 1 tablet.  . Omega-3 Fatty Acids (FISH OIL) 1000 MG CAPS 1 capsule.  [name] omeprazole (PRILOSEC) 20 MG capsule Take  1 capsule (20 mg total) by mouth daily. (Patient taking differently: Take 20 mg by mouth 2 (two) times daily before a meal. )  . PARoxetine (PAXIL-CR) 12.5 MG 24 hr tablet TAKE 1 TABLET BY MOUTH ONCE DAILY  . pravastatin (PRAVACHOL) 40 MG tablet TAKE ONE TABLET BY MOUTH AT BEDTIME  . Pseudoephedrine-Guaifenesin [phone] MG TB12 Take 1 tablet by mouth 2 (two) times daily. For congestion  . triamterene-hydrochlorothiazide (MAXZIDE-25) 37.5-25 MG tablet TAKE 1 TABLET BY MOUTH ONCE DAILY  . TURMERIC PO Take by mouth daily.  . vardenafil (LEVITRA) 20 MG tablet Take 20 mg by mouth daily.     No  facility-administered encounter medications on file as of 02/22/2017.       Review of Systems  Constitutional: Negative.   HENT: Negative.   Eyes: Negative.   Respiratory: Negative.   Cardiovascular: Negative.   Gastrointestinal: Negative.   Endocrine: Negative.   Genitourinary: Negative.   Musculoskeletal: Positive for arthralgias (bilateral shoulder pain R>L).  Skin: Negative.   Allergic/Immunologic: Negative.   Neurological: Negative.   Hematological: Negative.   Psychiatric/Behavioral: Negative.        Objective:   Physical Exam  Constitutional: He is oriented to person, place, and time. He appears well-developed and well-nourished. No distress.  Patient is pleasant and alert  HENT:  Head: Normocephalic and atraumatic.  Right Ear: External ear normal.  Left Ear: External ear normal.  Nose: Nose normal.  Mouth/Throat: Oropharynx is clear and moist. No oropharyngeal exudate.  Eyes: Pupils are equal, round, and reactive to light. Conjunctivae and EOM are normal. Right eye exhibits no discharge. Left eye exhibits no discharge. No scleral icterus.  Neck: Normal range of motion. Neck supple. No thyromegaly present.  Cardiovascular: Normal rate, regular rhythm, normal heart sounds and intact distal pulses.   No murmur heard. Heart has a regular rate and rhythm at 60/min.  Pulmonary/Chest: Effort normal and breath sounds normal. No respiratory distress. He has no wheezes. He has no rales. He exhibits no tenderness.  No axillary adenopathy clear anteriorly and posteriorly  Abdominal: Soft. Bowel sounds are normal. He exhibits no mass. There is no tenderness. There is no rebound and no guarding.  Abdominal obesity without masses tenderness or organ enlargement or bruits.  Genitourinary: Rectum normal and penis normal.  Genitourinary Comments: The patient did complain of some tingling  sensation she is in the right lower abdomen.  The inguinal exam was negative for nodes or hernia  but upon asking the patient to apply pressure to his abdominal contents there seems to be some fullness in the right lower quadrant like a hernia there.  He says this is been better recently.  We will plan to recheck this again in a couple of months.  The rectal exam itself showed the prostate gland to be slightly enlarged but smooth and there were no rectal masses.  No inguinal hernias were present bilaterally.  There was this fullness it could be a hernia in the lower right lower quadrant of the abdomen and we will recheck there is as noted.  Musculoskeletal: He exhibits no edema.  Patient has a cast on his right forearm for the fracture.  He also has some limited mobility of the right arm and may have an impingement problem which he will schedule to follow-up with an orthopedic surgeon with.  Lymphadenopathy:    He has no cervical adenopathy.  Neurological: He is alert and oriented to person, place, and time. He has normal reflexes. No  cranial nerve deficit.  Skin: Skin is warm and dry. No rash noted.  Psychiatric: He has a normal mood and affect. His behavior is normal. Judgment and thought content normal.  Nursing note and vitals reviewed.   BP 128/83 (BP Location: Right Arm)   Pulse 63   Temp 97.6 F (36.4 C) (Oral)   Ht 5\' 10"  (1.778 m)   Wt 235 lb (106.6 kg)   BMI 33.72 kg/m        Assessment & Plan:  1. Annual physical exam -The patient will not need another colonoscopy until December 2026. -We will add a thyroid profile to his current blood work that was reviewed with him today. - Urinalysis, Complete  2. Pure hypercholesterolemia -Cholesterol numbers were good he will continue with current treatment  3. Benign prostatic hyperplasia, unspecified whether lower urinary tract symptoms present -The patient's prostate gland was only slightly enlarged and no lumps or masses in the PSA was stable. - Urinalysis, Complete  4. Vitamin D deficiency -He will increase his vitamin D  to 2000 units daily.  5. Essential hypertension -His blood pressure and electrolytes were good.  6.  There was some fullness in the right lower quadrant which may be a hernia that is present and we will follow-up on this again in a couple of months.  Patient Instructions  Continue current medications. Continue good therapeutic lifestyle changes which include good diet and exercise. Fall precautions discussed with patient. If an FOBT was given today- please return it to our front desk. If you are over 23 years old - you may need Prevnar 13 or the adult Pneumonia vaccine.  **Flu shots are available--- please call and schedule a FLU-CLINIC appointment**  After your visit with [place] today you will receive a survey in the mail or online from [employer] regarding your care with [place]. Please take a moment to fill this out. Your feedback is very important to [place] as you can help [place] better understand your patient needs as well as improve your experience and satisfaction. WE CARE ABOUT YOU!!!  Schedule visit with orthopedic surgeon to further evaluate right shoulder pain which may be an impingement syndrome They may can recommend some physical therapy which can help the shoulder and possibly do some injections. Return to clinic in a couple months and recheck right groin fullness Continue to follow-up with orthopedics for left wrist fracture   Nyra Capes MD

## 2017-02-22 NOTE — Patient Instructions (Addendum)
Continue current medications. Continue good therapeutic lifestyle changes which include good diet and exercise. Fall precautions discussed with patient. If an FOBT was given today- please return it to our front desk. If you are over 58 years old - you may need Prevnar 13 or the adult Pneumonia vaccine.  **Flu shots are available--- please call and schedule a FLU-CLINIC appointment**  After your visit with us today you will receive a survey in the mail or online from [employer] regarding your care with us. Please take a moment to fill this out. Your feedback is very important to us as you can help us better understand your patient needs as well as improve your experience and satisfaction. WE CARE ABOUT YOU!!!  Schedule visit with orthopedic surgeon to further evaluate right shoulder pain which may be an impingement syndrome They may can recommend some physical therapy which can help the shoulder and possibly do some injections. Return to clinic in a couple months and recheck right groin fullness Continue to follow-up with orthopedics for left wrist fracture

## 2017-02-23 LAB — MICROSCOPIC EXAMINATION: Casts: NONE SEEN /lpf

## 2017-02-23 LAB — URINALYSIS, COMPLETE
BILIRUBIN UA: NEGATIVE
GLUCOSE, UA: NEGATIVE
KETONES UA: NEGATIVE
Leukocytes, UA: NEGATIVE
NITRITE UA: NEGATIVE
RBC UA: NEGATIVE
SPEC GRAV UA: 1.025 (ref 1.005–1.030)
UUROB: 0.2 mg/dL (ref 0.2–1.0)
pH, UA: 5.5 (ref 5.0–7.5)

## 2017-02-24 LAB — THYROID PANEL WITH TSH
Free Thyroxine Index: 1.8 (ref 1.2–4.9)
T3 UPTAKE RATIO: 28 % (ref 24–39)
T4, Total: 6.4 ug/dL (ref 4.5–12.0)
TSH: 2.35 u[IU]/mL (ref 0.450–4.500)

## 2017-02-24 LAB — SPECIMEN STATUS REPORT

## 2017-04-05 ENCOUNTER — Other Ambulatory Visit: Payer: Self-pay | Admitting: Family Medicine

## 2017-04-11 ENCOUNTER — Encounter: Payer: Self-pay | Admitting: Family Medicine

## 2017-04-11 ENCOUNTER — Ambulatory Visit: Payer: Managed Care, Other (non HMO) | Admitting: Family Medicine

## 2017-04-11 ENCOUNTER — Ambulatory Visit (INDEPENDENT_AMBULATORY_CARE_PROVIDER_SITE_OTHER): Payer: Managed Care, Other (non HMO) | Admitting: Family Medicine

## 2017-04-11 VITALS — BP 141/87 | HR 64 | Temp 97.6°F | Ht 70.0 in | Wt 239.0 lb

## 2017-04-11 DIAGNOSIS — L570 Actinic keratosis: Secondary | ICD-10-CM | POA: Diagnosis not present

## 2017-04-11 DIAGNOSIS — S62102S Fracture of unspecified carpal bone, left wrist, sequela: Secondary | ICD-10-CM | POA: Diagnosis not present

## 2017-04-11 DIAGNOSIS — K4090 Unilateral inguinal hernia, without obstruction or gangrene, not specified as recurrent: Secondary | ICD-10-CM | POA: Diagnosis not present

## 2017-04-11 DIAGNOSIS — I10 Essential (primary) hypertension: Secondary | ICD-10-CM | POA: Diagnosis not present

## 2017-04-11 NOTE — Patient Instructions (Signed)
Try to take less diclofenac as this may play a role with raising her blood pressure Make every effort to eat less sodium and lose some weight We will make arrangements for you to see a general surgeon regarding the bulging and fullness in the right groin area which may be an inguinal hernia Monitor blood pressure readings at home Come by the office in a couple weeks for the nurse to check the blood pressure here and bring readings from home with you to that visit Cut back on the diclofenac, is taking it twice daily may be raising the blood pressure more Take extra strength Tylenol in the evening 1 or 2 tablets.

## 2017-04-11 NOTE — Progress Notes (Signed)
Subjective:    Patient ID: Jacob Cline, male    DOB: 11-26-1960, 56 y.o.   MRN: [id number]  HPI Patient here today for 2 month follow up on right groin fullness.  The patient returns to the office today for follow-up of right groin fullness.  He also complains of a dry skin lesion on the back of his neck on the left side.  He also discusses some epigastric muscle spasm at times.  He has questions about his diclofenac.  The blood pressure was elevated initially and elevated on repeat but lower at 141/87.  The patient is still recovering from his surgery and left wrist fracture.  He did have a fall recently and had more x-rays done and everything was stable with this.  He is getting ready to get physical therapy for this.  His issues with diclofenac are that he has been taking it twice daily and this could be playing a role with his elevated blood pressure today.  I have encouraged him to take less of this and try to take extra strength Tylenol and only one diclofenac daily.  He does see the dermatologist but has what appears to be an actinic keratosis on the back of his neck.  He is not had any change in his bowel habits.  He is passing his water without problems.  He denies any chest pain or shortness of breath.  He has gained 4 pounds of weight because of his inactivity.    Patient Active Problem List   Diagnosis Date Noted  . Increased BMI 10/10/2016  . Degenerative arthritis of hip 10/28/2014  . Metatarsalgia of both feet 10/24/2014  . Porokeratosis 08/07/2013  . Pain in lower limb 08/07/2013  . Onychomycosis 08/07/2013  . Chest discomfort 05/28/2013  . Hypertension 01/30/2013  . Hyperlipidemia 01/30/2013  . Hiatal hernia with gastroesophageal reflux 01/30/2013  . BPH (benign prostatic hyperplasia) 01/30/2013  . Anxiety state, unspecified, history of 01/30/2013  . Low back pain 01/30/2013  . Erectile dysfunction 01/30/2013   Outpatient Encounter Medications as of 04/11/2017    Medication Sig  . amLODipine (NORVASC) 5 MG tablet TAKE ONE TABLET BY MOUTH ONCE DAILY  . aspirin 81 MG chewable tablet Chew 81 mg by mouth daily.    . cholecalciferol (VITAMIN D) 1000 UNITS tablet Take 1,000 Units by mouth daily. 3 tablets daily  . Cinnamon 500 MG capsule 500 mg.  . diclofenac (VOLTAREN) 75 MG EC tablet TAKE 1 TABLET BY MOUTH TWICE DAILY  . fluticasone (FLONASE) 50 MCG/ACT nasal spray Place 2 sprays into both nostrils daily.  . Multiple Vitamin (MULTIVITAMIN) tablet 1 tablet.  . Omega-3 Fatty Acids (FISH OIL) 1000 MG CAPS 1 capsule.  [name]. omeprazole (PRILOSEC) 20 MG capsule Take 1 capsule (20 mg total) by mouth daily. (Patient taking differently: Take 20 mg by mouth 2 (two) times daily before a meal. )  . PARoxetine (PAXIL-CR) 12.5 MG 24 hr tablet TAKE 1 TABLET BY MOUTH ONCE DAILY  . pravastatin (PRAVACHOL) 40 MG tablet TAKE 1 TABLET BY MOUTH AT BEDTIME  . Pseudoephedrine-Guaifenesin [id number] MG TB12 Take 1 tablet by mouth 2 (two) times daily. For congestion  . triamterene-hydrochlorothiazide (MAXZIDE-25) 37.5-25 MG tablet TAKE 1 TABLET BY MOUTH ONCE DAILY  . TURMERIC PO Take by mouth daily.  . vardenafil (LEVITRA) 20 MG tablet Take 20 mg by mouth daily.    . [DISCONTINUED] doxycycline (VIBRA-TABS) 100 MG tablet Take 1 tablet (100 mg total) by mouth 2 (two) times daily.  1 po bid   No facility-administered encounter medications on file as of 04/11/2017.       Review of Systems  Constitutional: Negative.   HENT: Negative.   Eyes: Negative.   Respiratory: Negative.   Cardiovascular: Negative.   Gastrointestinal: Negative.        "muscle spams" in abdomen   Endocrine: Negative.   Genitourinary: Negative.   Musculoskeletal: Negative.   Skin: Negative.        Dry skin lesion on neck   Allergic/Immunologic: Negative.   Neurological: Negative.   Hematological: Negative.   Psychiatric/Behavioral: Negative.        Objective:   Physical Exam  Constitutional: He is  oriented to person, place, and time. He appears well-developed and well-nourished. No distress.  Pleasant and alert  HENT:  Head: Normocephalic and atraumatic.  Eyes: Conjunctivae and EOM are normal. Pupils are equal, round, and reactive to light. Right eye exhibits no discharge. Left eye exhibits no discharge. No scleral icterus.  Neck: Normal range of motion.  Abdominal: Soft. Bowel sounds are normal. He exhibits mass. There is tenderness. There is no rebound and no guarding.  Right groin fullness especially with abdominal pressure.  This is more prominent on the right and less prominent on the left.  This appears to be a hernia and this is most likely what the patient is complaining with with the fullness.  Musculoskeletal: He exhibits no edema or tenderness.  Limited range of motion of left wrist due to recent fracture and recovery from this.  Neurological: He is alert and oriented to person, place, and time.  Skin: Skin is warm and dry. No rash noted.  Psychiatric: He has a normal mood and affect. His behavior is normal. Judgment and thought content normal.  Nursing note and vitals reviewed.  BP (!) 141/87   Pulse 64   Temp 97.6 F (36.4 C) (Oral)   Ht 5\' 10"  (1.778 m)   Wt 239 lb (108.4 kg)   BMI 34.29 kg/m   Cryotherapy to actinic keratosis on the left posterior neck area without problems.     Assessment & Plan:  1. Left wrist fracture, sequela -Continue to follow-up with orthopedist and get physical therapy as planned  2. Right inguinal hernia -Do referral to [facility]  3. Actinic keratosis -Cryotherapy accomplished without problems.  4. Essential hypertension -Blood pressure was elevated today and patient will try to eat less sodium lose some weight and take less diclofenac.   Patient Instructions  Try to take less diclofenac as this may play a role with raising her blood pressure Make every effort to eat less sodium and lose some weight We will make  arrangements for you to see a general surgeon regarding the bulging and fullness in the right groin area which may be an inguinal hernia Monitor blood pressure readings at home Come by the office in a couple weeks for the nurse to check the blood pressure here and bring readings from home with you to that visit Cut back on the diclofenac, is taking it twice daily may be raising the blood pressure more Take extra strength Tylenol in the evening 1 or 2 tablets.   Nyra Capeson W. Moore MD

## 2017-04-11 NOTE — Addendum Note (Signed)
Addended by: Magdalene RiverBULLINS, Fanchon Papania H on: 04/11/2017 11:44 AM   Modules accepted: Orders

## 2017-04-25 ENCOUNTER — Ambulatory Visit (INDEPENDENT_AMBULATORY_CARE_PROVIDER_SITE_OTHER): Payer: Managed Care, Other (non HMO) | Admitting: *Deleted

## 2017-04-25 DIAGNOSIS — I10 Essential (primary) hypertension: Secondary | ICD-10-CM

## 2017-04-25 NOTE — Progress Notes (Signed)
Pt here for BP check BP 142 86   P 68 BP 144 88   P 72 With pt's machine BP 156 101 P 72 Pt will contact company to calibrate home BP machine

## 2017-05-04 ENCOUNTER — Other Ambulatory Visit: Payer: Self-pay | Admitting: Family Medicine

## 2017-05-15 HISTORY — PX: HERNIA REPAIR: SHX51

## 2017-05-17 ENCOUNTER — Other Ambulatory Visit: Payer: Self-pay | Admitting: Family Medicine

## 2017-05-18 MED ORDER — OMEPRAZOLE 20 MG PO CPDR: 20.0000 mg | DELAYED_RELEASE_CAPSULE | Freq: Two times a day (BID) | ORAL | 0 refills | Status: DC

## 2017-05-18 NOTE — Telephone Encounter (Signed)
Refill sent to pharmacy.   

## 2017-05-28 ENCOUNTER — Other Ambulatory Visit: Payer: Self-pay | Admitting: Family Medicine

## 2017-06-19 ENCOUNTER — Telehealth: Payer: Self-pay | Admitting: Family Medicine

## 2017-06-19 NOTE — Telephone Encounter (Signed)
Aware.  Scheduled an appointment.

## 2017-06-21 ENCOUNTER — Encounter: Payer: Self-pay | Admitting: Family Medicine

## 2017-06-21 ENCOUNTER — Ambulatory Visit (INDEPENDENT_AMBULATORY_CARE_PROVIDER_SITE_OTHER): Payer: Managed Care, Other (non HMO) | Admitting: Family Medicine

## 2017-06-21 VITALS — BP 130/80 | HR 71 | Temp 97.2°F | Ht 70.0 in | Wt 241.0 lb

## 2017-06-21 DIAGNOSIS — I10 Essential (primary) hypertension: Secondary | ICD-10-CM | POA: Diagnosis not present

## 2017-06-21 MED ORDER — AMLODIPINE BESYLATE 5 MG PO TABS: 7.5000 mg | ORAL_TABLET | Freq: Every day | ORAL | 3 refills | Status: DC

## 2017-06-21 MED ORDER — OMEPRAZOLE 20 MG PO CPDR: 20.0000 mg | DELAYED_RELEASE_CAPSULE | Freq: Two times a day (BID) | ORAL | 3 refills | Status: DC

## 2017-06-21 NOTE — Progress Notes (Signed)
Subjective:    Patient ID: Jacob Cline, male    DOB: [birth year], 59 y.o.   MRN: [id number]  HPI Patient here today for follow up on HTN.  The patient has a DOT physical coming up soon he is afraid that his blood pressure may be too high.  This patient worries a lot.  Previously he did fine with the blood pressure and we did have to add some additional medicine for short period of time.  The vital signs today were good on 2 different occasions the first time being 136/85 and the second time being 130/80.  He is requesting a refill on omeprazole.  The patient is a Product/process development scientist.  He stresses about a lot of things going on his life.  He also has gained about 5 or 6 pounds in the past few months because he has had bilateral hernia repairs and has had carpal tunnel surgery.  He has been out of work and not exercising regularly and this is contributed to his weight gain.  He is currently taking amlodipine 5 mg daily and triamterene HCTZ or Maxide 25.  He also has taken more of the diclofenac and he understands that this can raise the blood pressure.  He is trying to work on losing weight and drinking more water currently.  A third recheck on the blood pressure with a manual cuff was 142/96 in the right arm sitting.  This is similar to some of the readings he has been getting.  It is also similar to reading he got in another doctor's office recently.  He denies any chest pain or shortness of breath.  He does have some spasms in his stomach but they are mostly on the skin and not deep inside.  He does not have any shortness of breath and is not having any trouble passing his water.    Patient Active Problem List   Diagnosis Date Noted  . Increased BMI 10/10/2016  . Degenerative arthritis of hip 10/28/2014  . Metatarsalgia of both feet 10/24/2014  . Porokeratosis 08/07/2013  . Pain in lower limb 08/07/2013  . Onychomycosis 08/07/2013  . Chest discomfort 05/28/2013  . Hypertension 01/30/2013  .  Hyperlipidemia 01/30/2013  . Hiatal hernia with gastroesophageal reflux 01/30/2013  . BPH (benign prostatic hyperplasia) 01/30/2013  . Anxiety state, unspecified, history of 01/30/2013  . Low back pain 01/30/2013  . Erectile dysfunction 01/30/2013   Outpatient Encounter Medications as of 06/21/2017  Medication Sig  . amLODipine (NORVASC) 5 MG tablet TAKE ONE TABLET BY MOUTH ONCE DAILY  . aspirin 81 MG chewable tablet Chew 81 mg by mouth daily.    . cholecalciferol (VITAMIN D) 1000 UNITS tablet Take 1,000 Units by mouth daily. 3 tablets daily  . Cinnamon 500 MG capsule 500 mg.  . diclofenac (VOLTAREN) 75 MG EC tablet TAKE 1 TABLET BY MOUTH TWICE DAILY  . fluticasone (FLONASE) 50 MCG/ACT nasal spray Place 2 sprays into both nostrils daily.  . Multiple Vitamin (MULTIVITAMIN) tablet 1 tablet.  . Omega-3 Fatty Acids (FISH OIL) 1000 MG CAPS 1 capsule.  [name] omeprazole (PRILOSEC) 20 MG capsule Take 1 capsule (20 mg total) by mouth 2 (two) times daily before a meal.  . PARoxetine (PAXIL-CR) 12.5 MG 24 hr tablet TAKE 1 TABLET BY MOUTH ONCE DAILY  . pravastatin (PRAVACHOL) 40 MG tablet TAKE 1 TABLET BY MOUTH AT BEDTIME  . Pseudoephedrine-Guaifenesin [phone] MG TB12 Take 1 tablet by mouth 2 (two) times daily. For congestion  . triamterene-hydrochlorothiazide (  MAXZIDE-25) 37.5-25 MG tablet TAKE 1 TABLET BY MOUTH ONCE DAILY  . TURMERIC PO Take by mouth daily.  . vardenafil (LEVITRA) 20 MG tablet Take 20 mg by mouth daily.     No facility-administered encounter medications on file as of 06/21/2017.       Review of Systems  Constitutional: Negative.   HENT: Negative.   Eyes: Negative.   Respiratory: Negative.   Cardiovascular: Negative.        Elevated BP   Gastrointestinal: Negative.   Endocrine: Negative.   Genitourinary: Negative.   Musculoskeletal: Negative.   Skin: Negative.   Allergic/Immunologic: Negative.   Neurological: Negative.   Hematological: Negative.   Psychiatric/Behavioral:  Negative.        Objective:   Physical Exam  Constitutional: He is oriented to person, place, and time. He appears well-developed and well-nourished. He appears distressed.  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  HENT:  Head: Normocephalic and atraumatic.  Eyes: Conjunctivae and EOM are normal. Pupils are equal, round, and reactive to light. Right eye exhibits no discharge. Left eye exhibits no discharge. No scleral icterus.  Neck: Normal range of motion. Neck supple. No thyromegaly present.  Cardiovascular: Normal rate, regular rhythm and normal heart sounds.  No murmur heard. Heart is 72/min with a regular rate and rhythm  Pulmonary/Chest: Effort normal and breath sounds normal. No respiratory distress. He has no wheezes. He has no rales. He exhibits no tenderness.  Clear anteriorly and posteriorly  Abdominal: Soft. Bowel sounds are normal. He exhibits no mass. There is no tenderness. There is no rebound and no guarding.  Abdominal obesity without masses tenderness or organ enlargement or bruits.  The laparoscopic scars from his recent hernia repairs are still evident.  Musculoskeletal: Normal range of motion. He exhibits no edema.  Lymphadenopathy:    He has no cervical adenopathy.  Neurological: He is alert and oriented to person, place, and time.  Skin: Skin is warm and dry. No rash noted.  Psychiatric: He has a normal mood and affect. His behavior is normal. Judgment and thought content normal.  Nursing note and vitals reviewed.  BP 130/80   Pulse 71   Temp (!) 97.2 F (36.2 C) (Oral)   Ht 5\' 10"  (1.778 m)   Wt 241 lb (109.3 kg)   BMI 34.58 kg/m         Assessment & Plan:  1. Essential hypertension -Increase amlodipine to 7-1/2 mg daily -Continue to watch sodium intake closely -Continue to drink more water and less drinks and no carbs and no desserts and reduce bread in  diet. -Avoid all NSAIDs and only take Tylenol  Patient Instructions  The blood pressure is borderline and slightly elevated. The patient understands that NSAIDs like his diclofenac can cause fluid retention and raise his blood pressure The patient also understands that he needs to drink more water and drink less drinks He also needs to lose weight and he understands this He also knows that he needs to reduce his sodium intake and that there is sodium and a lot of the drinks that he is drinking. We will have him continue with his increased water intake decrease sodium intake and increase walking and physical activity and have him raise his amlodipine to 7-1/2 mg daily and stay on the triamterene. He will come back to this office in a couple of weeks for a recheck of the blood  pressure and another weight.  Nyra Capeson W. Moore MD

## 2017-06-21 NOTE — Patient Instructions (Signed)
The blood pressure is borderline and slightly elevated. The patient understands that NSAIDs like his diclofenac can cause fluid retention and raise his blood pressure The patient also understands that he needs to drink more water and drink less drinks He also needs to lose weight and he understands this He also knows that he needs to reduce his sodium intake and that there is sodium and a lot of the drinks that he is drinking. We will have him continue with his increased water intake decrease sodium intake and increase walking and physical activity and have him raise his amlodipine to 7-1/2 mg daily and stay on the triamterene. He will come back to this office in a couple of weeks for a recheck of the blood pressure and another weight.

## 2017-06-21 NOTE — Addendum Note (Signed)
Addended by: Magdalene RiverBULLINS, Legend Tumminello H on: 06/21/2017 04:28 PM   Modules accepted: Orders

## 2017-06-30 ENCOUNTER — Ambulatory Visit (INDEPENDENT_AMBULATORY_CARE_PROVIDER_SITE_OTHER): Payer: Managed Care, Other (non HMO) | Admitting: *Deleted

## 2017-06-30 ENCOUNTER — Ambulatory Visit: Payer: Managed Care, Other (non HMO) | Admitting: Family Medicine

## 2017-06-30 VITALS — BP 127/74 | HR 74 | Wt 239.4 lb

## 2017-06-30 DIAGNOSIS — Z0130 Encounter for examination of blood pressure without abnormal findings: Secondary | ICD-10-CM

## 2017-06-30 NOTE — Progress Notes (Signed)
Make sure that patient repeats blood pressure and weight in about 4 weeks

## 2017-06-30 NOTE — Progress Notes (Signed)
Continue current treatment and follow-up on blood pressure in 4 weeks

## 2017-06-30 NOTE — Progress Notes (Signed)
Pt here for BP and weight check BP 127 74 P 74 Wt  239.4lb

## 2017-07-21 ENCOUNTER — Telehealth: Payer: Self-pay | Admitting: *Deleted

## 2017-07-21 NOTE — Telephone Encounter (Signed)
-----   Message from Ernestina Pennaonald W Moore, MD sent at 06/30/2017 12:48 PM EST -----   ----- Message ----- From: Bearl Mulberryutherford, Natalie K Sent: 06/30/2017  11:44 AM To: Ernestina Pennaonald W Moore, MD

## 2017-07-24 ENCOUNTER — Ambulatory Visit: Payer: Managed Care, Other (non HMO) | Admitting: *Deleted

## 2017-07-24 DIAGNOSIS — Z0130 Encounter for examination of blood pressure without abnormal findings: Secondary | ICD-10-CM

## 2017-07-24 NOTE — Progress Notes (Signed)
Pt here for BP and weight check BP 136/74 P 64 Weight 235  Pt is taking Amlodipine 5mg  Pt decreased from 7 1/2 mg after DOT PE

## 2017-09-05 ENCOUNTER — Other Ambulatory Visit: Payer: Self-pay | Admitting: Family Medicine

## 2017-10-16 ENCOUNTER — Telehealth: Payer: Self-pay | Admitting: Family Medicine

## 2017-10-16 ENCOUNTER — Other Ambulatory Visit: Payer: Self-pay | Admitting: *Deleted

## 2017-10-16 DIAGNOSIS — E7800 Pure hypercholesterolemia, unspecified: Secondary | ICD-10-CM

## 2017-10-16 DIAGNOSIS — E559 Vitamin D deficiency, unspecified: Secondary | ICD-10-CM

## 2017-10-16 DIAGNOSIS — I10 Essential (primary) hypertension: Secondary | ICD-10-CM

## 2017-10-16 NOTE — Telephone Encounter (Signed)
appt made

## 2017-10-25 ENCOUNTER — Other Ambulatory Visit: Payer: Managed Care, Other (non HMO)

## 2017-10-25 DIAGNOSIS — E7800 Pure hypercholesterolemia, unspecified: Secondary | ICD-10-CM

## 2017-10-25 DIAGNOSIS — E559 Vitamin D deficiency, unspecified: Secondary | ICD-10-CM

## 2017-10-25 DIAGNOSIS — I10 Essential (primary) hypertension: Secondary | ICD-10-CM

## 2017-10-26 LAB — LIPID PANEL
CHOL/HDL RATIO: 4.4 ratio (ref 0.0–5.0)
CHOLESTEROL TOTAL: 181 mg/dL (ref 100–199)
HDL: 41 mg/dL (ref >39)
LDL CALC: 107 mg/dL — AB (ref 0–99)
Triglycerides: 167 mg/dL — ABNORMAL HIGH (ref 0–149)
VLDL Cholesterol Cal: 33 mg/dL (ref 5–40)

## 2017-10-26 LAB — HEPATIC FUNCTION PANEL
ALBUMIN: 4.1 g/dL (ref 3.5–5.5)
ALT: 32 IU/L (ref 0–44)
AST: 23 IU/L (ref 0–40)
Alkaline Phosphatase: 97 IU/L (ref 39–117)
BILIRUBIN TOTAL: 0.4 mg/dL (ref 0.0–1.2)
Bilirubin, Direct: 0.11 mg/dL (ref 0.00–0.40)
TOTAL PROTEIN: 6.7 g/dL (ref 6.0–8.5)

## 2017-10-26 LAB — CBC WITH DIFFERENTIAL/PLATELET
BASOS: 0 %
Basophils Absolute: 0 10*3/uL (ref 0.0–0.2)
EOS (ABSOLUTE): 0.1 10*3/uL (ref 0.0–0.4)
Eos: 2 %
HEMOGLOBIN: 15.4 g/dL (ref 13.0–17.7)
Hematocrit: 44.2 % (ref 37.5–51.0)
IMMATURE GRANS (ABS): 0 10*3/uL (ref 0.0–0.1)
Immature Granulocytes: 0 %
LYMPHS: 27 %
Lymphocytes Absolute: 1.6 10*3/uL (ref 0.7–3.1)
MCH: 30.1 pg (ref 26.6–33.0)
MCHC: 34.8 g/dL (ref 31.5–35.7)
MCV: 87 fL (ref 79–97)
MONOCYTES: 6 %
Monocytes Absolute: 0.4 10*3/uL (ref 0.1–0.9)
Neutrophils Absolute: 3.9 10*3/uL (ref 1.4–7.0)
Neutrophils: 65 %
Platelets: 207 10*3/uL (ref 150–450)
RBC: 5.11 x10E6/uL (ref 4.14–5.80)
RDW: 14.2 % (ref 12.3–15.4)
WBC: 6 10*3/uL (ref 3.4–10.8)

## 2017-10-26 LAB — BMP8+EGFR
BUN/Creatinine Ratio: 29 — ABNORMAL HIGH (ref 9–20)
BUN: 21 mg/dL (ref 6–24)
CALCIUM: 8.9 mg/dL (ref 8.7–10.2)
CHLORIDE: 102 mmol/L (ref 96–106)
CO2: 25 mmol/L (ref 20–29)
Creatinine, Ser: 0.73 mg/dL — ABNORMAL LOW (ref 0.76–1.27)
GFR calc Af Amer: 117 mL/min/{1.73_m2} (ref >59)
GFR calc non Af Amer: 102 mL/min/{1.73_m2} (ref >59)
Glucose: 96 mg/dL (ref 65–99)
POTASSIUM: 4.2 mmol/L (ref 3.5–5.2)
Sodium: 141 mmol/L (ref 134–144)

## 2017-10-26 LAB — VITAMIN D 25 HYDROXY (VIT D DEFICIENCY, FRACTURES): Vit D, 25-Hydroxy: 37.2 ng/mL (ref 30.0–100.0)

## 2017-11-02 ENCOUNTER — Other Ambulatory Visit: Payer: Self-pay | Admitting: Family Medicine

## 2017-11-02 NOTE — Telephone Encounter (Signed)
Last seen 06/21/17  DWM 

## 2017-11-18 ENCOUNTER — Other Ambulatory Visit: Payer: Self-pay | Admitting: Family Medicine

## 2017-11-24 ENCOUNTER — Other Ambulatory Visit: Payer: Self-pay | Admitting: Family Medicine

## 2017-11-29 ENCOUNTER — Other Ambulatory Visit: Payer: Self-pay | Admitting: Family Medicine

## 2017-12-18 ENCOUNTER — Ambulatory Visit (INDEPENDENT_AMBULATORY_CARE_PROVIDER_SITE_OTHER): Payer: Managed Care, Other (non HMO)

## 2017-12-18 ENCOUNTER — Encounter: Payer: Self-pay | Admitting: Family Medicine

## 2017-12-18 ENCOUNTER — Ambulatory Visit (INDEPENDENT_AMBULATORY_CARE_PROVIDER_SITE_OTHER): Payer: Managed Care, Other (non HMO) | Admitting: Family Medicine

## 2017-12-18 VITALS — BP 132/77 | HR 61 | Temp 97.0°F | Ht 70.0 in | Wt 239.0 lb

## 2017-12-18 DIAGNOSIS — E559 Vitamin D deficiency, unspecified: Secondary | ICD-10-CM

## 2017-12-18 DIAGNOSIS — E7800 Pure hypercholesterolemia, unspecified: Secondary | ICD-10-CM

## 2017-12-18 DIAGNOSIS — I10 Essential (primary) hypertension: Secondary | ICD-10-CM

## 2017-12-18 DIAGNOSIS — R531 Weakness: Secondary | ICD-10-CM

## 2017-12-18 MED ORDER — TRIAMTERENE-HCTZ 37.5-25 MG PO TABS: 1.0000 | ORAL_TABLET | Freq: Every day | ORAL | 11 refills | Status: DC

## 2017-12-18 MED ORDER — FLUTICASONE PROPIONATE 50 MCG/ACT NA SUSP: 2.0000 | Freq: Every day | NASAL | 11 refills | Status: DC

## 2017-12-18 MED ORDER — AMLODIPINE BESYLATE 5 MG PO TABS: 7.5000 mg | ORAL_TABLET | Freq: Every day | ORAL | 11 refills | Status: DC

## 2017-12-18 MED ORDER — PRAVASTATIN SODIUM 40 MG PO TABS: 40.0000 mg | ORAL_TABLET | Freq: Every day | ORAL | 3 refills | Status: DC

## 2017-12-18 MED ORDER — OMEPRAZOLE 20 MG PO CPDR: 20.0000 mg | DELAYED_RELEASE_CAPSULE | Freq: Two times a day (BID) | ORAL | 3 refills | Status: DC

## 2017-12-18 MED ORDER — PAROXETINE HCL ER 12.5 MG PO TB24: 12.5000 mg | ORAL_TABLET | Freq: Every day | ORAL | 3 refills | Status: DC

## 2017-12-18 NOTE — Progress Notes (Signed)
Subjective:    Patient ID: Jacob Cline, male    DOB: 1961-02-02, 57 y.o.   MRN: [id number]  HPI Pt here for follow up and management of chronic medical problems which includes hypertension and hyperlipidemia. He is taking medication regularly.  The patient is doing well overall.  He does have concerns about his family history of diabetes.  He complains with some swelling in his legs and occasional weakness that occurred 2 times before eating.  He would like to have refills on his Flonase as well as all of his other medicines.  He will be due to get chest x-ray today and will be given an FOBT to return.  His weight is 239.  The patient has had problems checking his blood pressure at home with his monitor.  He says it does not seem to check correctly.  He will try to get someone to check this so that he can keep up with his blood pressures more readily.  He is taking his amlodipine 5 mg regularly.  He is still working and plans to work for at least 5 more years.  In talking with him about his blood work we emphasized the importance of drinking more water eating less sodium and getting more exercise.  All of his lab work was good except the LDL C was elevated and this was reviewed with him during the visit today.  His blood sugar renal electrolytes liver function test were all good as well as his hemoglobin and white blood cell count.  The patient is going to get his eye exam today he will make sure we get a copy of that report.  He denies any headaches blurred vision or dizziness.  He denies any trouble with chest pain pressure tightness or shortness of breath.  He denies any trouble with swallowing heartburn indigestion nausea vomiting diarrhea blood in the stool or black tarry bowel movements.  He is passing his water without problems.  We did talk again today about losing weight and how losing weight can impact blood pressure diabetes and lots of health issues and he says he understands the importance of  this.  Patient today also complains of some right groin pain.  He has been seeing the chiropractor.  He has a history of bilateral hernia repairs.  Patient is also interested in getting the series of shingle shots.    Patient Active Problem List   Diagnosis Date Noted  . Increased BMI 10/10/2016  . Degenerative arthritis of hip 10/28/2014  . Metatarsalgia of both feet 10/24/2014  . Porokeratosis 08/07/2013  . Pain in lower limb 08/07/2013  . Onychomycosis 08/07/2013  . Chest discomfort 05/28/2013  . Hypertension 01/30/2013  . Hyperlipidemia 01/30/2013  . Hiatal hernia with gastroesophageal reflux 01/30/2013  . BPH (benign prostatic hyperplasia) 01/30/2013  . Anxiety state, unspecified, history of 01/30/2013  . Low back pain 01/30/2013  . Erectile dysfunction 01/30/2013   Outpatient Encounter Medications as of 12/18/2017  Medication Sig  . amLODipine (NORVASC) 5 MG tablet Take 1.5 tablets (7.5 mg total) by mouth daily.  [name] aspirin 81 MG chewable tablet Chew 81 mg by mouth daily.    . cholecalciferol (VITAMIN D) 1000 UNITS tablet Take 1,000 Units by mouth daily. 3 tablets daily  . Cinnamon 500 MG capsule 500 mg.  . diclofenac (VOLTAREN) 75 MG EC tablet TAKE 1 TABLET BY MOUTH TWICE DAILY  . fluticasone (FLONASE) 50 MCG/ACT nasal spray Place 2 sprays into both nostrils daily.  [name]  Multiple Vitamin (MULTIVITAMIN) tablet 1 tablet.  . Omega-3 Fatty Acids (FISH OIL) 1000 MG CAPS 1 capsule.  [name]. omeprazole (PRILOSEC) 20 MG capsule Take 1 capsule (20 mg total) by mouth 2 (two) times daily before a meal.  . PARoxetine (PAXIL-CR) 12.5 MG 24 hr tablet TAKE 1 TABLET BY MOUTH ONCE DAILY  . pravastatin (PRAVACHOL) 40 MG tablet TAKE 1 TABLET BY MOUTH AT BEDTIME  . Pseudoephedrine-Guaifenesin [id number] MG TB12 Take 1 tablet by mouth 2 (two) times daily. For congestion  . triamterene-hydrochlorothiazide (MAXZIDE-25) 37.5-25 MG tablet TAKE 1 TABLET BY MOUTH ONCE DAILY  . TURMERIC PO Take by mouth daily.  .  vardenafil (LEVITRA) 20 MG tablet Take 20 mg by mouth daily.     No facility-administered encounter medications on file as of 12/18/2017.       Review of Systems  Constitutional: Negative.   HENT: Negative.   Eyes: Negative.   Respiratory: Negative.   Cardiovascular: Positive for leg swelling.  Gastrointestinal: Negative.   Endocrine: Negative.   Genitourinary: Negative.   Musculoskeletal: Negative.   Skin: Negative.   Allergic/Immunologic: Negative.   Neurological: Positive for weakness (before eating on 2 occasional ).  Hematological: Negative.   Psychiatric/Behavioral: Negative.        Objective:   Physical Exam  Constitutional: He is oriented to person, place, and time. He appears well-developed and well-nourished. No distress.  The patient is pleasant and relaxed and in good spirits today.  HENT:  Head: Normocephalic and atraumatic.  Right Ear: External ear normal.  Left Ear: External ear normal.  Nose: Nose normal.  Mouth/Throat: Oropharynx is clear and moist. No oropharyngeal exudate.  Eyes: Pupils are equal, round, and reactive to light. Conjunctivae and EOM are normal. Right eye exhibits no discharge. Left eye exhibits no discharge. No scleral icterus.  Eye exam is planned for today.  Neck: Normal range of motion. Neck supple. No thyromegaly present.  No bruits thyromegaly or anterior cervical adenopathy  Cardiovascular: Normal rate, regular rhythm, normal heart sounds and intact distal pulses.  No murmur heard. The heart is regular at 60/min  Pulmonary/Chest: Effort normal and breath sounds normal. He has no wheezes. He has no rales. He exhibits no tenderness.  Lungs are clear anteriorly and posteriorly and no axillary adenopathy or chest wall masses.  There is a small lump which the patient says is been there for a long period of time at the right sternal border about nipple level.  This appears to be a little cystic type area that is the size of a pea.  He says it  has been there for years with no change.  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  No specific history of any injury.  Abdominal: Soft. Bowel sounds are normal. He exhibits no mass. There is no tenderness. There is no guarding.  Abdominal obesity without masses organ enlargement or bruits.  There is some tenderness across the upper abdomen.  Genitourinary:  Genitourinary Comments: Because of some right inguinal discomfort both testicles were checked and no inguinal hernias were palpable today.  Musculoskeletal: Normal range of motion. He exhibits no edema.  Lymphadenopathy:    He has no cervical adenopathy.  Neurological: He is alert and oriented to person, place, and time. He has normal reflexes. No cranial nerve deficit.  Normal reflexes bilaterally  Skin: Skin is warm and dry. No rash noted.  Psychiatric: He has a normal mood and affect. His behavior  is normal. Judgment and thought content normal.  Mood affect and judgment were all normal.  Nursing note and vitals reviewed.  BP 132/77 (BP Location: Left Arm)   Pulse 61   Temp (!) 97 F (36.1 C) (Oral)   Ht 5\' 10"  (1.778 m)   Wt 239 lb (108.4 kg)   BMI 34.29 kg/m   Chest x-ray with results pending===      Assessment & Plan:  1. Pure hypercholesterolemia -Restart pravastatin and take this more regularly and try to do better with diet and exercise - DG Chest 2 View; Future  2. Essential hypertension -Blood pressure is good today and he will continue with current treatment - DG Chest 2 View; Future  3. Vitamin D deficiency -He will increase his vitamin D3 intake 1000 Monday through Friday and 2000 on Saturday and Sunday.  4. Weakness -He will try to walk and exercise more and make every attempt through therapeutic lifestyle changes to lose weight - Thyroid Panel With TSH - Vitamin B12 - Bayer DCA Hb A1c Waived - Urinalysis, Complete  Meds ordered this encounter  Medications    . pravastatin (PRAVACHOL) 40 MG tablet    Sig: Take 1 tablet (40 mg total) by mouth at bedtime.    Dispense:  90 tablet    Refill:  3  . PARoxetine (PAXIL-CR) 12.5 MG 24 hr tablet    Sig: Take 1 tablet (12.5 mg total) by mouth daily.    Dispense:  90 tablet    Refill:  3  . omeprazole (PRILOSEC) 20 MG capsule    Sig: Take 1 capsule (20 mg total) by mouth 2 (two) times daily before a meal.    Dispense:  180 capsule    Refill:  3  . triamterene-hydrochlorothiazide (MAXZIDE-25) 37.5-25 MG tablet    Sig: Take 1 tablet by mouth daily.    Dispense:  30 tablet    Refill:  11    Please consider 90 day supplies to promote better adherence  . amLODipine (NORVASC) 5 MG tablet    Sig: Take 1.5 tablets (7.5 mg total) by mouth daily.    Dispense:  45 tablet    Refill:  11    Please consider 90 day supplies to promote better adherence  . fluticasone (FLONASE) 50 MCG/ACT nasal spray    Sig: Place 2 sprays into both nostrils daily.    Dispense:  16 g    Refill:  11   Patient Instructions  Continue current medications. Continue good therapeutic lifestyle changes which include good diet and exercise. Fall precautions discussed with patient. If an FOBT was given today- please return it to our front desk. If you are over 85 years old - you may need Prevnar 13 or the adult Pneumonia vaccine.  **Flu shots are available--- please call and schedule a FLU-CLINIC appointment**  After your visit with [place] today you will receive a survey in the mail or online from [employer] regarding your care with [place]. Please take a moment to fill this out. Your feedback is very important to [place] as you can help [place] better understand your patient needs as well as improve your experience and satisfaction. WE CARE ABOUT YOU!!!   Continue to monitor blood pressures at home and bring readings by for review in 4 to 6 weeks Drink plenty of water drink less drinks eat less bread and potatoes eat more fresh fruits and vegetables  walk regularly Make an aggressive effort to lose weight through diet and  exercise Eat less sodium We will call with lab work results as soon as those results become available  Nyra Capeson W. Moore MD

## 2017-12-18 NOTE — Patient Instructions (Addendum)
Continue current medications. Continue good therapeutic lifestyle changes which include good diet and exercise. Fall precautions discussed with patient. If an FOBT was given today- please return it to our front desk. If you are over 59 years old - you may need Prevnar 13 or the adult Pneumonia vaccine.  **Flu shots are available--- please call and schedule a FLU-CLINIC appointment**  After your visit with us today you will receive a survey in the mail or online from [employer] regarding your care with us. Please take a moment to fill this out. Your feedback is very important to us as you can help us better understand your patient needs as well as improve your experience and satisfaction. WE CARE ABOUT YOU!!!   Continue to monitor blood pressures at home and bring readings by for review in 4 to 6 weeks Drink plenty of water drink less drinks eat less bread and potatoes eat more fresh fruits and vegetables walk regularly Make an aggressive effort to lose weight through diet and exercise Eat less sodium We will call with lab work results as soon as those results become available

## 2017-12-19 LAB — THYROID PANEL WITH TSH
Free Thyroxine Index: 1.6 (ref 1.2–4.9)
T3 Uptake Ratio: 27 % (ref 24–39)
T4, Total: 6.1 ug/dL (ref 4.5–12.0)
TSH: 1.31 u[IU]/mL (ref 0.450–4.500)

## 2017-12-19 LAB — MICROSCOPIC EXAMINATION
Bacteria, UA: NONE SEEN
Epithelial Cells (non renal): NONE SEEN /HPF (ref 0–10)
Renal Epithel, UA: NONE SEEN /HPF
WBC, UA: NONE SEEN /HPF (ref 0–5)

## 2017-12-19 LAB — BAYER DCA HB A1C WAIVED: HB A1C: 5.7 % (ref <7.0)

## 2017-12-19 LAB — URINALYSIS, COMPLETE
BILIRUBIN UA: NEGATIVE
GLUCOSE, UA: NEGATIVE
Ketones, UA: NEGATIVE
LEUKOCYTES UA: NEGATIVE
Nitrite, UA: NEGATIVE
PH UA: 6 (ref 5.0–7.5)
PROTEIN UA: NEGATIVE
SPEC GRAV UA: 1.02 (ref 1.005–1.030)
Urobilinogen, Ur: 0.2 mg/dL (ref 0.2–1.0)

## 2017-12-19 LAB — VITAMIN B12: VITAMIN B 12: 499 pg/mL (ref 232–1245)

## 2017-12-21 ENCOUNTER — Telehealth: Payer: Self-pay | Admitting: Family Medicine

## 2017-12-21 NOTE — Telephone Encounter (Signed)
Patient aware of results.

## 2017-12-26 ENCOUNTER — Telehealth: Payer: Self-pay | Admitting: Family Medicine

## 2017-12-26 DIAGNOSIS — I10 Essential (primary) hypertension: Secondary | ICD-10-CM

## 2017-12-26 NOTE — Telephone Encounter (Signed)
Can we do a referral since he was seen recently by PCP or must he schedule another visit. (Last Cardiology referral was done 2016).

## 2017-12-26 NOTE — Telephone Encounter (Signed)
Referral placed- [email or web address] 

## 2017-12-26 NOTE — Telephone Encounter (Signed)
I would say go ahead and do the referral

## 2017-12-26 NOTE — Telephone Encounter (Signed)
PT is needing a referral to Dr Elease HashimotoNahser (cardiologst) that's who he has seen in the past but according to that office its been a while since he has been seen and needs a referral. PT needs a Monday morning apt due to his work schedule

## 2018-01-10 NOTE — Telephone Encounter (Signed)
No response from patient. Note filed. 

## 2018-01-12 ENCOUNTER — Ambulatory Visit (INDEPENDENT_AMBULATORY_CARE_PROVIDER_SITE_OTHER): Payer: Managed Care, Other (non HMO)

## 2018-01-12 DIAGNOSIS — Z23 Encounter for immunization: Secondary | ICD-10-CM | POA: Diagnosis not present

## 2018-01-12 NOTE — Progress Notes (Signed)
Patient reports he has checked with his insurance company and the vaccine is covered by insurance.

## 2018-02-19 ENCOUNTER — Other Ambulatory Visit: Payer: Self-pay | Admitting: Family Medicine

## 2018-03-23 ENCOUNTER — Telehealth: Payer: Self-pay | Admitting: Family Medicine

## 2018-03-23 MED ORDER — PAROXETINE HCL ER 25 MG PO TB24: 25.0000 mg | ORAL_TABLET | Freq: Every day | ORAL | 1 refills | Status: DC

## 2018-03-23 NOTE — Telephone Encounter (Signed)
Can change to Paxil CR 25 1 daily.

## 2018-03-23 NOTE — Telephone Encounter (Signed)
Pt called  He is currently taking the paxil CR 12.5 daily - would like to go up to 25mg  daily.  I pended the order - please to over to pharm if approved.

## 2018-03-23 NOTE — Telephone Encounter (Signed)
Rx sent to pharmacy   

## 2018-03-23 NOTE — Telephone Encounter (Signed)
Please confirm with patient his current dose of Paxil.  It comes in 10 mg to 20 mg and 30 mg.  If he is taking 10 mg we can increase the dose to 20 mg.

## 2018-04-23 ENCOUNTER — Encounter: Payer: Self-pay | Admitting: Cardiovascular Disease

## 2018-04-23 ENCOUNTER — Ambulatory Visit (INDEPENDENT_AMBULATORY_CARE_PROVIDER_SITE_OTHER): Payer: Managed Care, Other (non HMO) | Admitting: Cardiovascular Disease

## 2018-04-23 VITALS — BP 132/88 | HR 69 | Ht 70.0 in | Wt 245.8 lb

## 2018-04-23 DIAGNOSIS — I119 Hypertensive heart disease without heart failure: Secondary | ICD-10-CM

## 2018-04-23 MED ORDER — TELMISARTAN 40 MG PO TABS: 40.0000 mg | ORAL_TABLET | Freq: Every day | ORAL | 11 refills | Status: DC

## 2018-04-23 NOTE — Patient Instructions (Addendum)
Medication Instructions:  Your physician has recommended you make the following change in your medication:  STOP Amlodipine  START Telmisartan (Micardis) 40 mg once daily  If you need a refill on your cardiac medications before your next appointment, please call your pharmacy.   Lab work: Your physician recommends that you return for lab work in: 3 weeks for basic metabolic panel  If you have labs (blood work) drawn today and your tests are completely normal, you will receive your results only by: [name]. MyChart Message (if you have MyChart) OR . A paper copy in the mail If you have any lab test that is abnormal or we need to change your treatment, we will call you to review the results.   Testing/Procedures: Your physician has requested that you have an echocardiogram. Echocardiography is a painless test that uses sound waves to create images of your heart. It provides your doctor with information about the size and shape of your heart and how well your heart's chambers and valves are working. This procedure takes approximately one hour. There are no restrictions for this procedure.    Follow-Up: At [facility], you and your health needs are our priority.  As part of our continuing mission to provide you with exceptional heart care, we have created designated Provider Care Teams.  These Care Teams include your primary Cardiologist (physician) and Advanced Practice Providers (APPs -  Physician Assistants and Nurse Practitioners) who all work together to provide you with the care you need, when you need it. You will need a follow up appointment in:  6 months.  Please call our office 2 months in advance to schedule this appointment.  You may see Dr. Elease HashimotoNahser or one of the following Advanced Practice Providers on your designated Care Team: Tereso NewcomerScott Weaver, PA-C Vin SalixBhagat, [place]PA-C . Berton BonJanine Hammond, NP

## 2018-04-23 NOTE — Progress Notes (Signed)
Jacob Cline Date of Birth  27-Sep-1960       [facility]    [place] [address]  [address] [place], [place]  [address]   [place], [place]  [address] [phone]     [phone]   Fax  [phone]    Fax [phone]  Problem List: 1. Chest pain:  seen in the emergency room 2. Hypertension 3. Hyperlipidemia      I have seen Jacob Cline in the past for CP and abnormal ECG.  We have performed a cath in the past which was normal.   He also has had a stress myoview which was normal.    Jacob Cline had an episode of chest heaviness about a month ago.  Also associated with a head ache.  Was seen in the ER, work up was negative.   He has had some occasional recurent chest pain,.  Dull , shooting pain, comes and goes.   Also seems to have chest heaviness that comes on at random times ( not associated with any specific activity) which Seems to be relived with antiacids.  Has not seen his medical doctor.    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    He does not get any regular exercise.   He has taken Lovastatin in the past but stopped taking it due to  muscle aches.    Sep 10, 2013:  Jacob Cline was last seen in January, 2015. He had a stress Myoview study for recurrent episodes of chest discomfort.  The Myoview showed no evidence of ischemia. His left ventricular systolic function was normal with an EF of 57%.  Still having some CP.   He works 14 hour days ( 2 AM to 4 PM)   so has limited time to exercise.  Tries not to eat any fried foods by sometimes does not have a good option.   November 03, 2014:  Jacob Cline is doing well.  No CP.  Able to do all of his normal activities without any limitation .   Was seen by Dr. Christell Constant for pre-op visit and his ECG was found to be abnormal .    he's had T-wave inversions in the anterolateral leads for the past several years. He had a Myoview study last year which was normal. He had no evidence of  ischemia. He's done very well and has not had any cardiac symptoms.  April 23, 2018: Ms. seen back today for follow-up visit.  He was last seen 3 years ago. He has baseline T wave inversions in his lateral leads on his EKG. Is here for a check up  Has not had any particular issues.   No CP , no dyspnea. Works - drives a Multimedia programmer  Has developed leg swelling and chronic stasis changs  Still eats fast  Food  No regular exercise     Current Outpatient Medications on File Prior to Visit  Medication Sig Dispense Refill  . amLODipine (NORVASC) 5 MG tablet Take 1.5 tablets (7.5 mg total) by mouth daily. 45 tablet 11  . aspirin 81 MG chewable tablet Chew 81 mg by mouth daily.      . cholecalciferol (VITAMIN D) 1000 UNITS tablet Take 1,000 Units by mouth daily. 3 tablets daily    . Cinnamon 500 MG capsule Take 1,000 mg by mouth daily.     . diclofenac (VOLTAREN) 75 MG EC tablet TAKE 1 TABLET BY MOUTH TWICE  DAILY 60 tablet 1  . fluticasone (FLONASE) 50 MCG/ACT nasal spray Place 2 sprays into both nostrils daily. 16 g 11  . Multiple Vitamin (MULTIVITAMIN) tablet Take 1 tablet by mouth daily.     . Omega-3 Fatty Acids (FISH OIL) 1000 MG CAPS Take 1 capsule by mouth daily.     [name] omeprazole (PRILOSEC) 20 MG capsule Take 1 capsule (20 mg total) by mouth 2 (two) times daily before a meal. 180 capsule 3  . PARoxetine (PAXIL-CR) 25 MG 24 hr tablet Take 1 tablet (25 mg total) by mouth daily. 90 tablet 1  . pravastatin (PRAVACHOL) 40 MG tablet Take 1 tablet (40 mg total) by mouth at bedtime. 90 tablet 3  . triamterene-hydrochlorothiazide (MAXZIDE-25) 37.5-25 MG tablet Take 1 tablet by mouth daily. 30 tablet 11  . TURMERIC PO Take 1 capsule by mouth daily.     . vardenafil (LEVITRA) 20 MG tablet Take 20 mg by mouth daily.       No current facility-administered medications on file prior to visit.     Allergies  Allergen Reactions  . Other Anaphylaxis    Make me hurt alot  . Pravastatin  Other (See Comments)    Stiff neck  . Codeine     Unknown   . Lovastatin     Makes me hurt alot  . Niaspan [Niacin Er]     Make me hurt alot    Past Medical History:  Diagnosis Date  . BPH (benign prostatic hypertrophy)   . Hemorrhoids   . Hiatal hernia   . Hyperlipidemia   . Hypertension   . Irregular heart beat   . Obesity   . Post-operative nausea and vomiting     Past Surgical History:  Procedure Laterality Date  . athroscopy of rt knee  02/02  . henia - umbilical surgery  07/17/07  . HERNIA REPAIR  05/15/2017   double hernia surgery  . HIP SURGERY  11/2014   right hip  . MOUTH SURGERY    . VASECTOMY      Social History   Tobacco Use  Smoking Status Never Smoker  Smokeless Tobacco Never Used    Social History   Substance and Sexual Activity  Alcohol Use No  . Alcohol/week: 0.0 standard drinks    Family History  Problem Relation Age of Onset  . Diabetes Mother   . Scleroderma Sister   . Diabetes Brother   . Diabetes Brother   . Colon cancer Unknown        family history   . Prostate cancer Unknown        family history     Reviw of Systems:  Reviewed in the HPI.  All other systems are negative.  Physical Exam: Blood pressure 132/88, pulse 69, height 5\' 10"  (1.778 m), weight 245 lb 12.8 oz (111.5 kg), SpO2 96 %.  GEN:   Middle age, mildly obese male, nNAD  HEENT: Normal NECK: No JVD; No carotid bruits LYMPHATICS: No lymphadenopathy CARDIAC: RRR   RESPIRATORY:  Clear to auscultation without rales, wheezing or rhonchi  ABDOMEN: Soft, non-tender, non-distended MUSCULOSKELETAL:  No edema; No deformity  SKIN: Warm and dry NEUROLOGIC:  Alert and oriented x 3   ECG: Dec. 12, 2014:  NSR TWI in the ant. lat  Jan. 20, 2015:  The TWI have improved.   November 03, 2014:  NSR at 73.  Minimal criteria for LVH.  NS T wave abn. No significant changes from previous tracing  Dec.  16, 2019:   NSR at 69 beats minute.  T wave inversions in leads V3  through V6.   Assessment / Plan:    1. Chest pain:  seen in the emergency room.  He has TWI in the anterior lateral leads which is old.  myoview study in the past was unremarkable   2. Hypertension -  BP is fairly well controll.  I think his leg edema is due to his amlodipine and the fact that he does not get out and exercise much.  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  Discontinue the amlodipine.  We will start him on telmisartan 40 mg a day.  We will check a basic metabolic profile in 3 weeks.  I will see him in 6 months for follow-up visit  3. Hyperlipidemia Managed by his primary    Kristeen MissPhilip Emarie Paul, MD  04/23/2018 2:27 PM    [facility] [address],  [address] [place], [place]  [address] Pager [phone] Phone: [phone]; Fax: [phone]

## 2018-04-30 ENCOUNTER — Other Ambulatory Visit: Payer: Self-pay

## 2018-04-30 ENCOUNTER — Ambulatory Visit (HOSPITAL_COMMUNITY): Payer: Managed Care, Other (non HMO) | Attending: Cardiovascular Disease

## 2018-04-30 DIAGNOSIS — I119 Hypertensive heart disease without heart failure: Secondary | ICD-10-CM | POA: Insufficient documentation

## 2018-05-14 ENCOUNTER — Other Ambulatory Visit: Payer: Managed Care, Other (non HMO) | Admitting: *Deleted

## 2018-05-14 DIAGNOSIS — I119 Hypertensive heart disease without heart failure: Secondary | ICD-10-CM

## 2018-05-14 LAB — BASIC METABOLIC PANEL
BUN/Creatinine Ratio: 27 — ABNORMAL HIGH (ref 9–20)
BUN: 23 mg/dL (ref 6–24)
CO2: 23 mmol/L (ref 20–29)
Calcium: 8.4 mg/dL — ABNORMAL LOW (ref 8.7–10.2)
Chloride: 101 mmol/L (ref 96–106)
Creatinine, Ser: 0.84 mg/dL (ref 0.76–1.27)
GFR calc Af Amer: 111 mL/min/{1.73_m2} (ref >59)
GFR calc non Af Amer: 96 mL/min/{1.73_m2} (ref >59)
GLUCOSE: 99 mg/dL (ref 65–99)
Potassium: 4.2 mmol/L (ref 3.5–5.2)
Sodium: 140 mmol/L (ref 134–144)

## 2018-05-21 ENCOUNTER — Other Ambulatory Visit: Payer: Self-pay | Admitting: Family Medicine

## 2018-05-21 NOTE — Telephone Encounter (Signed)
Last seen 12/16/17  DWM

## 2018-06-20 ENCOUNTER — Ambulatory Visit: Payer: Managed Care, Other (non HMO) | Admitting: Family Medicine

## 2018-06-27 ENCOUNTER — Ambulatory Visit: Payer: Managed Care, Other (non HMO)

## 2018-06-27 ENCOUNTER — Ambulatory Visit (INDEPENDENT_AMBULATORY_CARE_PROVIDER_SITE_OTHER): Payer: Managed Care, Other (non HMO) | Admitting: Family Medicine

## 2018-06-27 ENCOUNTER — Encounter: Payer: Self-pay | Admitting: Family Medicine

## 2018-06-27 VITALS — BP 117/72 | HR 83 | Temp 99.3°F | Ht 70.0 in | Wt 246.0 lb

## 2018-06-27 DIAGNOSIS — J020 Streptococcal pharyngitis: Secondary | ICD-10-CM

## 2018-06-27 DIAGNOSIS — J029 Acute pharyngitis, unspecified: Secondary | ICD-10-CM | POA: Diagnosis not present

## 2018-06-27 LAB — VERITOR FLU A/B WAIVED
Influenza A: NEGATIVE
Influenza B: NEGATIVE

## 2018-06-27 LAB — RAPID STREP SCREEN (MED CTR MEBANE ONLY): Strep Gp A Ag, IA W/Reflex: POSITIVE — AB

## 2018-06-27 MED ORDER — PENICILLIN V POTASSIUM 500 MG PO TABS: 500.0000 mg | ORAL_TABLET | Freq: Three times a day (TID) | ORAL | 0 refills | Status: DC

## 2018-06-27 NOTE — Progress Notes (Signed)
    Subjective:     Jacob Cline is a 58 y.o. male who presents for evaluation of sore throat. Associated symptoms include low grade fevers, chest congestion, bilateral ear fullness, enlarged tonsils, headache, green nasal discharge, post nasal drip, productive cough, sinus and nasal congestion and sore throat. Onset of symptoms was 2 days ago, and have been gradually worsening since that time. He is drinking plenty of fluids. He has had a recent close exposure to someone with proven streptococcal pharyngitis and influenza. He has tried over the counter Mucinex and Flonase without relief of symptoms.   The following portions of the patient's history were reviewed and updated as appropriate: allergies, current medications, past family history, past medical history, past social history, past surgical history and problem list.  Review of Systems Constitutional: positive for chills, fatigue and fevers Eyes: negative Ears, nose, mouth, throat, and face: positive for earaches, nasal congestion and sore throat Respiratory: positive for cough and sputum Cardiovascular: negative Gastrointestinal: negative Musculoskeletal:positive for myalgias Neurological: positive for headaches    Objective:    BP 117/72   Pulse 83   Temp 99.3 F (37.4 C) (Oral)   Ht 5\' 10"  (1.778 m)   Wt 246 lb (111.6 kg)   BMI 35.30 kg/m  General appearance: alert, cooperative and mild distress Head: Normocephalic, without obvious abnormality, atraumatic Eyes: negative Ears: abnormal TM right ear - serous middle ear fluid and abnormal TM left ear - serous middle ear fluid Nose: scant and yellow discharge, mild congestion, turbinates red, swollen, no sinus tenderness Throat: abnormal findings: exudates present, marked oropharyngeal erythema and tonsillar hypertrophy 3+ Neck: moderate anterior cervical adenopathy, no carotid bruit, no JVD, supple, symmetrical, trachea midline and thyroid not enlarged, symmetric, no  tenderness/mass/nodules Lungs: clear to auscultation bilaterally Heart: regular rate and rhythm, S1, S2 normal, no murmur, click, rub or gallop Skin: Skin color, texture, turgor normal. No rashes or lesions Lymph nodes: Cervical adenopathy: moderate Neurologic: Grossly normal  Laboratory Strep test done. Results:positive.   Influenza negative.   Assessment:     Jacob Cline was seen today for uri.  Diagnoses and all orders for this visit:  Sore throat -     Veritor Flu A/B Waived -     Rapid Strep Screen ([facility] ONLY)  Streptococcal sore throat Symptomatic care discussed. Report any new or worsening symptoms. Medications as prescribed.  -     penicillin v potassium (VEETID) 500 MG tablet; Take 1 tablet (500 mg total) by mouth 3 (three) times daily for 10 days.    Plan:    Patient placed on antibiotics. Use of OTC analgesics recommended as well as salt water gargles. Use of decongestant recommended. Patient advised of the risk of peritonsillar abscess formation. Patient advised that he will be infectious for 24 hours after starting antibiotics. Follow up as needed.    The above assessment and management plan was discussed with the patient. The patient verbalized understanding of and has agreed to the management plan. Patient is aware to call the clinic if symptoms fail to improve or worsen. Patient is aware when to return to the clinic for a follow-up visit. Patient educated on when it is appropriate to go to the emergency department.   Kari Baars, FNP-C [facility] [address] [place], [place] [address] [phone]

## 2018-06-27 NOTE — Patient Instructions (Signed)

## 2018-07-02 ENCOUNTER — Ambulatory Visit (INDEPENDENT_AMBULATORY_CARE_PROVIDER_SITE_OTHER): Payer: Managed Care, Other (non HMO)

## 2018-07-02 ENCOUNTER — Encounter: Payer: Self-pay | Admitting: Family Medicine

## 2018-07-02 ENCOUNTER — Encounter: Payer: Self-pay | Admitting: *Deleted

## 2018-07-02 ENCOUNTER — Ambulatory Visit (INDEPENDENT_AMBULATORY_CARE_PROVIDER_SITE_OTHER): Payer: Managed Care, Other (non HMO) | Admitting: Family Medicine

## 2018-07-02 VITALS — BP 106/64 | HR 90 | Temp 98.9°F | Ht 70.0 in | Wt 244.0 lb

## 2018-07-02 DIAGNOSIS — Z0000 Encounter for general adult medical examination without abnormal findings: Secondary | ICD-10-CM

## 2018-07-02 DIAGNOSIS — N400 Enlarged prostate without lower urinary tract symptoms: Secondary | ICD-10-CM

## 2018-07-02 DIAGNOSIS — R05 Cough: Secondary | ICD-10-CM

## 2018-07-02 DIAGNOSIS — E7800 Pure hypercholesterolemia, unspecified: Secondary | ICD-10-CM

## 2018-07-02 DIAGNOSIS — R059 Cough, unspecified: Secondary | ICD-10-CM

## 2018-07-02 DIAGNOSIS — I10 Essential (primary) hypertension: Secondary | ICD-10-CM | POA: Diagnosis not present

## 2018-07-02 DIAGNOSIS — Z0001 Encounter for general adult medical examination with abnormal findings: Secondary | ICD-10-CM

## 2018-07-02 DIAGNOSIS — R509 Fever, unspecified: Secondary | ICD-10-CM

## 2018-07-02 DIAGNOSIS — J329 Chronic sinusitis, unspecified: Secondary | ICD-10-CM

## 2018-07-02 DIAGNOSIS — E559 Vitamin D deficiency, unspecified: Secondary | ICD-10-CM

## 2018-07-02 DIAGNOSIS — J209 Acute bronchitis, unspecified: Secondary | ICD-10-CM

## 2018-07-02 DIAGNOSIS — J310 Chronic rhinitis: Secondary | ICD-10-CM

## 2018-07-02 LAB — URINALYSIS, COMPLETE
Bilirubin, UA: NEGATIVE
Glucose, UA: NEGATIVE
Ketones, UA: NEGATIVE
Leukocytes, UA: NEGATIVE
Nitrite, UA: NEGATIVE
PH UA: 6.5 (ref 5.0–7.5)
Specific Gravity, UA: 1.02 (ref 1.005–1.030)
Urobilinogen, Ur: 4 mg/dL — ABNORMAL HIGH (ref 0.2–1.0)

## 2018-07-02 LAB — MICROSCOPIC EXAMINATION
Bacteria, UA: NONE SEEN
EPITHELIAL CELLS (NON RENAL): NONE SEEN /HPF (ref 0–10)
RENAL EPITHEL UA: NONE SEEN /HPF

## 2018-07-02 MED ORDER — TELMISARTAN 40 MG PO TABS: 40.0000 mg | ORAL_TABLET | Freq: Every day | ORAL | 3 refills | Status: DC

## 2018-07-02 MED ORDER — VARDENAFIL HCL 20 MG PO TABS: 20.0000 mg | ORAL_TABLET | Freq: Every day | ORAL | 5 refills | Status: DC | PRN

## 2018-07-02 MED ORDER — OMEPRAZOLE 20 MG PO CPDR: 20.0000 mg | DELAYED_RELEASE_CAPSULE | Freq: Two times a day (BID) | ORAL | 3 refills | Status: DC

## 2018-07-02 MED ORDER — FLUTICASONE PROPIONATE 50 MCG/ACT NA SUSP: 2.0000 | Freq: Every day | NASAL | 11 refills | Status: DC

## 2018-07-02 MED ORDER — ALBUTEROL SULFATE HFA 108 (90 BASE) MCG/ACT IN AERS: 2.0000 | INHALATION_SPRAY | Freq: Four times a day (QID) | RESPIRATORY_TRACT | 1 refills | Status: DC | PRN

## 2018-07-02 MED ORDER — PAROXETINE HCL ER 25 MG PO TB24: 25.0000 mg | ORAL_TABLET | Freq: Every day | ORAL | 3 refills | Status: DC

## 2018-07-02 MED ORDER — CEFDINIR 300 MG PO CAPS: 300.0000 mg | ORAL_CAPSULE | Freq: Two times a day (BID) | ORAL | 0 refills | Status: DC

## 2018-07-02 MED ORDER — PRAVASTATIN SODIUM 40 MG PO TABS: 40.0000 mg | ORAL_TABLET | Freq: Every day | ORAL | 3 refills | Status: DC

## 2018-07-02 MED ORDER — TRIAMTERENE-HCTZ 37.5-25 MG PO TABS: 1.0000 | ORAL_TABLET | Freq: Every day | ORAL | 3 refills | Status: DC

## 2018-07-02 NOTE — Patient Instructions (Addendum)
Continue current medications. Continue good therapeutic lifestyle changes which include good diet and exercise. Fall precautions discussed with patient. If an FOBT was given today- please return it to our front desk. If you are over 60 years old - you may need Prevnar 13 or the adult Pneumonia vaccine.  **Flu shots are available--- please call and schedule a FLU-CLINIC appointment**  After your visit with [place] today you will receive a survey in the mail or online from [employer] regarding your care with [place]. Please take a moment to fill this out. Your feedback is very important to [place] as you can help [place] better understand your patient needs as well as improve your experience and satisfaction. WE CARE ABOUT YOU!!!   Continue to follow-up with cardiology as planned Drink plenty of fluids take Tylenol and stay well-hydrated Take Mucinex, maximum strength, plain, blue and white in color, 1 twice daily with a large glass of water Discontinue penicillin and start Omnicef 300 twice daily with food Use nasal saline 1 spray each nostril 4 times daily Use inhaler as needed for wheezing 4 times daily Remain out of work through this weekend Return to the office in a couple of weeks to recheck right groin pain.  If pain is better he can cancel this appointment We will call with results of lab work and chest x-ray as soon as those results become available

## 2018-07-02 NOTE — Progress Notes (Signed)
Subjective:    Patient ID: Jacob Cline, male    DOB: [birth year], 58 y.o.   MRN: [id number]  HPI Patient is here today for annual wellness exam and follow up of chronic medical problems which includes hyperlipidemia and hypertension. He is taking medication regularly.  This patient has a history of left hip arthroplasty.  He also has been followed by the cardiologist and his blood pressure medicine was changed because of edema and he was placed on Micardis 40 and the amlodipine was stopped.  He had chest pain back and December and did have an echocardiogram with an ejection fraction of 55 to 60%.  He recently saw the midlevel here because of his positive strep throat and was placed on Pen-Vee K but is still feeling bad even after 6 days of treatment.  He also has increased chest congestion.  He is requesting refills on all of his medicines.  He is not running a fever.  The patient today denies any chest pain.  He says his blood pressures at home are doing well.  He is still sick and running fevers and especially even last night.  He is coughing up brown sputum and having green drainage from his head into his throat.  He denies any chest pain but does have the shortness of breath associated with his chest congestion.  He denies any nausea vomiting diarrhea blood in the stool black tarry bowel movements or change in bowel habits.  He does not recall any abnormality on his last colonoscopy and there is no family history of colon cancer.  The last colonoscopy was in December 16.  He is passing his water well.     Patient Active Problem List   Diagnosis Date Noted  . Increased BMI 10/10/2016  . Degenerative arthritis of hip 10/28/2014  . Metatarsalgia of both feet 10/24/2014  . Porokeratosis 08/07/2013  . Pain in lower limb 08/07/2013  . Onychomycosis 08/07/2013  . Chest discomfort 05/28/2013  . Hypertension 01/30/2013  . Hyperlipidemia 01/30/2013  . Hiatal hernia with gastroesophageal reflux  01/30/2013  . BPH (benign prostatic hyperplasia) 01/30/2013  . Anxiety state, unspecified, history of 01/30/2013  . Low back pain 01/30/2013  . Erectile dysfunction 01/30/2013   Outpatient Encounter Medications as of 07/02/2018  Medication Sig  . aspirin 81 MG chewable tablet Chew 81 mg by mouth daily.    . cholecalciferol (VITAMIN D) 1000 UNITS tablet Take 1,000 Units by mouth daily. 3 tablets daily  . Cinnamon 500 MG capsule Take 1,000 mg by mouth daily.   . diclofenac (VOLTAREN) 75 MG EC tablet TAKE 1 TABLET BY MOUTH TWICE DAILY  . fluticasone (FLONASE) 50 MCG/ACT nasal spray Place 2 sprays into both nostrils daily.  . Multiple Vitamin (MULTIVITAMIN) tablet Take 1 tablet by mouth daily.   . Omega-3 Fatty Acids (FISH OIL) 1000 MG CAPS Take 1 capsule by mouth daily.   [name] omeprazole (PRILOSEC) 20 MG capsule Take 1 capsule (20 mg total) by mouth 2 (two) times daily before a meal.  . PARoxetine (PAXIL-CR) 25 MG 24 hr tablet Take 1 tablet (25 mg total) by mouth daily.  . penicillin v potassium (VEETID) 500 MG tablet Take 1 tablet (500 mg total) by mouth 3 (three) times daily for 10 days.  . pravastatin (PRAVACHOL) 40 MG tablet Take 1 tablet (40 mg total) by mouth at bedtime.  [name] telmisartan (MICARDIS) 40 MG tablet Take 1 tablet (40 mg total) by mouth daily.  [name] triamterene-hydrochlorothiazide (MAXZIDE-25) 37.5-25  MG tablet Take 1 tablet by mouth daily.  . TURMERIC PO Take 1 capsule by mouth daily.   . vardenafil (LEVITRA) 20 MG tablet Take 20 mg by mouth daily.     No facility-administered encounter medications on file as of 07/02/2018.      Review of Systems  Constitutional: Positive for fever.  HENT: Positive for ear pain, postnasal drip and sore throat.   Eyes: Negative.   Respiratory: Positive for cough.   Cardiovascular: Negative.   Gastrointestinal: Negative.   Endocrine: Negative.   Genitourinary: Negative.   Musculoskeletal: Negative.   Skin: Negative.   Allergic/Immunologic:  Negative.   Neurological: Negative.   Hematological: Negative.   Psychiatric/Behavioral: Negative.        Objective:   Physical Exam Vitals signs and nursing note reviewed.  Constitutional:      General: He is not in acute distress.    Appearance: Normal appearance. He is well-developed. He is obese. He is not ill-appearing.  HENT:     Head: Normocephalic and atraumatic.     Right Ear: Tympanic membrane and external ear normal. There is no impacted cerumen.     Left Ear: Tympanic membrane, ear canal and external ear normal. There is no impacted cerumen.     Nose: Congestion present. No rhinorrhea.     Comments: Nasal turbinate congestion right greater than left with no sinus tenderness to palpation.    Mouth/Throat:     Mouth: Mucous membranes are moist.     Pharynx: Posterior oropharyngeal erythema present. No oropharyngeal exudate.  Eyes:     General: No scleral icterus.       Right eye: No discharge.        Left eye: No discharge.     Extraocular Movements: Extraocular movements intact.     Conjunctiva/sclera: Conjunctivae normal.     Pupils: Pupils are equal, round, and reactive to light.  Neck:     Musculoskeletal: Normal range of motion and neck supple. Muscular tenderness present. No neck rigidity.     Thyroid: No thyromegaly.     Vascular: No carotid bruit.     Trachea: No tracheal deviation.     Comments: No thyromegaly bruits or anterior cervical adenopathy Cardiovascular:     Rate and Rhythm: Normal rate and regular rhythm.     Pulses: Normal pulses.     Heart sounds: Normal heart sounds. No murmur.     Comments: The heart is regular at 84/min with good pedal pulses and no edema Pulmonary:     Effort: Pulmonary effort is normal. No respiratory distress.     Breath sounds: Wheezing present. No rales.     Comments: No axillary adenopathy or chest wall masses.  Increased congestion with coughing no rales audible rare wheeze. Abdominal:     General: Bowel sounds  are normal.     Palpations: Abdomen is soft. There is no mass.     Tenderness: There is no abdominal tenderness.     Comments: Abdomen is obese without liver or spleen enlargement masses or inguinal adenopathy  Genitourinary:    Penis: Normal.      Rectum: Normal.     Comments: Prostate is slightly enlarged with no hernias being palpated rectal exam was normal and no rectal masses and external genitalia were within normal limits.  There was slight fullness in the superior testicular area on the right. Musculoskeletal: Normal range of motion.        General: No tenderness.  Right lower leg: No edema.     Left lower leg: No edema.  Lymphadenopathy:     Cervical: No cervical adenopathy.  Skin:    General: Skin is warm and dry.     Findings: No rash.  Neurological:     General: No focal deficit present.     Mental Status: He is alert and oriented to person, place, and time. Mental status is at baseline.     Cranial Nerves: No cranial nerve deficit.     Gait: Gait normal.     Deep Tendon Reflexes: Reflexes are normal and symmetric.  Psychiatric:        Mood and Affect: Mood normal.        Behavior: Behavior normal.        Thought Content: Thought content normal.        Judgment: Judgment normal.     Comments: Mood affect and behavior were all normal for this patient other than just feeling bad from this bronchitis and URI.    BP 106/64 (BP Location: Left Arm)   Pulse 90   Temp 98.9 F (37.2 C) (Oral)   Ht 5' 10"  (1.778 m)   Wt 244 lb (110.7 kg)   BMI 35.01 kg/m         Assessment & Plan:  1. Annual physical exam -Continue plenty of fluids, get chest x-ray remain out of work through the weekend while treating infection - BMP8+EGFR - CBC with Differential/Platelet - Lipid panel - PSA, total and free - VITAMIN D 25 Hydroxy (Vit-D Deficiency, Fractures) - Hepatic function panel - Urinalysis, Complete  2. Benign prostatic hyperplasia, unspecified whether lower urinary  tract symptoms present -Patient is passing his water well with no abnormalities found on prostate exam - CBC with Differential/Platelet - PSA, total and free - Urinalysis, Complete  3. Pure hypercholesterolemia -Continue current treatment and therapeutic lifestyle changes - CBC with Differential/Platelet - Lipid panel  4. Essential hypertension -Continue current treatment and therapeutic lifestyle changes - BMP8+EGFR - CBC with Differential/Platelet - Hepatic function panel  5. Vitamin D deficiency -Continue with vitamin D replacement pending results of lab work - CBC with Differential/Platelet - VITAMIN D 25 Hydroxy (Vit-D Deficiency, Fractures)  6. Fever, unspecified fever cause -Take Tylenol for aches pains and fever - DG Chest 2 View; Future  7. Cough -Take Mucinex maximum strength, plain, blue and white in color, 1 twice daily with a large glass of water -Use albuterol inhaler 4 times daily as needed for wheezing and chest tightness - DG Chest 2 View; Future  8. Bronchitis with bronchospasm -Take Omnicef use inhaler and use Mucinex as directed  9. Rhinosinusitis -Take Omnicef twice daily with food  10.  Morbid obesity -Continue to work aggressively on weight with diet and exercise  Meds ordered this encounter  Medications  . fluticasone (FLONASE) 50 MCG/ACT nasal spray    Sig: Place 2 sprays into both nostrils daily.    Dispense:  16 g    Refill:  11  . omeprazole (PRILOSEC) 20 MG capsule    Sig: Take 1 capsule (20 mg total) by mouth 2 (two) times daily before a meal.    Dispense:  180 capsule    Refill:  3  . PARoxetine (PAXIL-CR) 25 MG 24 hr tablet    Sig: Take 1 tablet (25 mg total) by mouth daily.    Dispense:  90 tablet    Refill:  3  . pravastatin (PRAVACHOL) 40 MG tablet  Sig: Take 1 tablet (40 mg total) by mouth at bedtime.    Dispense:  90 tablet    Refill:  3  . telmisartan (MICARDIS) 40 MG tablet    Sig: Take 1 tablet (40 mg total) by mouth  daily.    Dispense:  90 tablet    Refill:  3  . triamterene-hydrochlorothiazide (MAXZIDE-25) 37.5-25 MG tablet    Sig: Take 1 tablet by mouth daily.    Dispense:  90 tablet    Refill:  3    Please consider 90 day supplies to promote better adherence  . vardenafil (LEVITRA) 20 MG tablet    Sig: Take 1 tablet (20 mg total) by mouth daily as needed for erectile dysfunction.    Dispense:  10 tablet    Refill:  5  . albuterol (PROVENTIL HFA;VENTOLIN HFA) 108 (90 Base) MCG/ACT inhaler    Sig: Inhale 2 puffs into the lungs every 6 (six) hours as needed for wheezing or shortness of breath.    Dispense:  1 Inhaler    Refill:  1  . cefdinir (OMNICEF) 300 MG capsule    Sig: Take 1 capsule (300 mg total) by mouth 2 (two) times daily. 1 po BID    Dispense:  20 capsule    Refill:  0   Patient Instructions  Continue current medications. Continue good therapeutic lifestyle changes which include good diet and exercise. Fall precautions discussed with patient. If an FOBT was given today- please return it to our front desk. If you are over 83 years old - you may need Prevnar 16 or the adult Pneumonia vaccine.  **Flu shots are available--- please call and schedule a FLU-CLINIC appointment**  After your visit with [place] today you will receive a survey in the mail or online from [employer] regarding your care with [place]. Please take a moment to fill this out. Your feedback is very important to [place] as you can help [place] better understand your patient needs as well as improve your experience and satisfaction. WE CARE ABOUT YOU!!!   Continue to follow-up with cardiology as planned Drink plenty of fluids take Tylenol and stay well-hydrated Take Mucinex, maximum strength, plain, blue and white in color, 1 twice daily with a large glass of water Discontinue penicillin and start Omnicef 300 twice daily with food Use nasal saline 1 spray each nostril 4 times daily Use inhaler as needed for wheezing 4 times  daily Remain out of work through this weekend Return to the office in a couple of weeks to recheck right groin pain.  If pain is better he can cancel this appointment We will call with results of lab work and chest x-ray as soon as those results become available  Arrie Senate MD

## 2018-07-03 ENCOUNTER — Telehealth: Payer: Self-pay | Admitting: Family Medicine

## 2018-07-03 LAB — HEPATIC FUNCTION PANEL
ALBUMIN: 4 g/dL (ref 3.8–4.9)
ALT: 50 IU/L — ABNORMAL HIGH (ref 0–44)
AST: 26 IU/L (ref 0–40)
Alkaline Phosphatase: 101 IU/L (ref 39–117)
Bilirubin Total: 0.6 mg/dL (ref 0.0–1.2)
Bilirubin, Direct: 0.21 mg/dL (ref 0.00–0.40)
Total Protein: 6.6 g/dL (ref 6.0–8.5)

## 2018-07-03 LAB — CBC WITH DIFFERENTIAL/PLATELET
Basophils Absolute: 0 10*3/uL (ref 0.0–0.2)
Basos: 0 %
EOS (ABSOLUTE): 0.1 10*3/uL (ref 0.0–0.4)
Eos: 1 %
Hematocrit: 40.5 % (ref 37.5–51.0)
Hemoglobin: 14.1 g/dL (ref 13.0–17.7)
Immature Grans (Abs): 0.1 10*3/uL (ref 0.0–0.1)
Immature Granulocytes: 1 %
Lymphocytes Absolute: 1.5 10*3/uL (ref 0.7–3.1)
Lymphs: 12 %
MCH: 30.3 pg (ref 26.6–33.0)
MCHC: 34.8 g/dL (ref 31.5–35.7)
MCV: 87 fL (ref 79–97)
MONOS ABS: 1.4 10*3/uL — AB (ref 0.1–0.9)
Monocytes: 11 %
Neutrophils Absolute: 9.6 10*3/uL — ABNORMAL HIGH (ref 1.4–7.0)
Neutrophils: 75 %
PLATELETS: 249 10*3/uL (ref 150–450)
RBC: 4.66 x10E6/uL (ref 4.14–5.80)
RDW: 13.3 % (ref 11.6–15.4)
WBC: 12.8 10*3/uL — ABNORMAL HIGH (ref 3.4–10.8)

## 2018-07-03 LAB — BMP8+EGFR
BUN/Creatinine Ratio: 29 — ABNORMAL HIGH (ref 9–20)
BUN: 22 mg/dL (ref 6–24)
CO2: 22 mmol/L (ref 20–29)
Calcium: 8.8 mg/dL (ref 8.7–10.2)
Chloride: 97 mmol/L (ref 96–106)
Creatinine, Ser: 0.77 mg/dL (ref 0.76–1.27)
GFR calc Af Amer: 115 mL/min/{1.73_m2} (ref >59)
GFR, EST NON AFRICAN AMERICAN: 99 mL/min/{1.73_m2} (ref >59)
Glucose: 101 mg/dL — ABNORMAL HIGH (ref 65–99)
POTASSIUM: 4.1 mmol/L (ref 3.5–5.2)
Sodium: 139 mmol/L (ref 134–144)

## 2018-07-03 LAB — LIPID PANEL
Chol/HDL Ratio: 3.8 ratio (ref 0.0–5.0)
Cholesterol, Total: 142 mg/dL (ref 100–199)
HDL: 37 mg/dL — ABNORMAL LOW (ref >39)
LDL Calculated: 85 mg/dL (ref 0–99)
Triglycerides: 98 mg/dL (ref 0–149)
VLDL Cholesterol Cal: 20 mg/dL (ref 5–40)

## 2018-07-03 LAB — PSA, TOTAL AND FREE
PSA FREE PCT: 22.5 %
PSA, Free: 0.18 ng/mL
Prostate Specific Ag, Serum: 0.8 ng/mL (ref 0.0–4.0)

## 2018-07-03 LAB — VITAMIN D 25 HYDROXY (VIT D DEFICIENCY, FRACTURES): Vit D, 25-Hydroxy: 39.1 ng/mL (ref 30.0–100.0)

## 2018-07-03 MED ORDER — SILDENAFIL CITRATE 20 MG PO TABS: ORAL_TABLET | ORAL | 2 refills | Status: DC

## 2018-07-03 NOTE — Telephone Encounter (Signed)
Please call prescription in for generic sildenafil 20 mg.  He can take anywhere from 1 to 5 pills at 1 time as needed, start with a lower strength and move upward to no more than 100 mg at a time.  The drugstore in [place] has generic sildenafil.  Give him the best quantity for the best price

## 2018-07-03 NOTE — Telephone Encounter (Signed)
Pt aware of order and details per VM

## 2018-07-09 ENCOUNTER — Other Ambulatory Visit: Payer: Managed Care, Other (non HMO)

## 2018-07-09 DIAGNOSIS — Z1211 Encounter for screening for malignant neoplasm of colon: Secondary | ICD-10-CM

## 2018-07-10 LAB — FECAL OCCULT BLOOD, IMMUNOCHEMICAL: Fecal Occult Bld: NEGATIVE

## 2018-07-11 ENCOUNTER — Telehealth: Payer: Self-pay | Admitting: Family Medicine

## 2018-07-16 ENCOUNTER — Other Ambulatory Visit: Payer: Self-pay | Admitting: Family Medicine

## 2018-07-23 ENCOUNTER — Telehealth: Payer: Self-pay | Admitting: Family Medicine

## 2018-07-23 NOTE — Telephone Encounter (Signed)
The cough that goes along with recent illnesses can last up to a month. Another antibiotic is not going to make a difference. I have found that delsym OTC works the best.

## 2018-07-23 NOTE — Telephone Encounter (Signed)
Patient aware.

## 2018-07-23 NOTE — Telephone Encounter (Signed)
Pt was seen couple weeks ago by Dr Christell Constant, he was given antibiotic for sinus infection, still coughing up fleam/mucus, pt is wanting to know if he needs to come back in to see Dr Christell Constant or can another round of antibiotic can be sent in    Pharmacy [facility]

## 2018-08-02 ENCOUNTER — Other Ambulatory Visit: Payer: Self-pay

## 2018-08-02 ENCOUNTER — Ambulatory Visit (INDEPENDENT_AMBULATORY_CARE_PROVIDER_SITE_OTHER): Payer: Managed Care, Other (non HMO) | Admitting: Family Medicine

## 2018-08-02 ENCOUNTER — Encounter: Payer: Self-pay | Admitting: Family Medicine

## 2018-08-02 DIAGNOSIS — J029 Acute pharyngitis, unspecified: Secondary | ICD-10-CM

## 2018-08-02 DIAGNOSIS — B370 Candidal stomatitis: Secondary | ICD-10-CM

## 2018-08-02 MED ORDER — NYSTATIN 100000 UNIT/ML MT SUSP: 5.0000 mL | Freq: Four times a day (QID) | OROMUCOSAL | 0 refills | Status: DC

## 2018-08-02 NOTE — Progress Notes (Signed)
Virtual Visit via telephone Note  I connected with [name] on 08/02/18 at 1340 by telephone and verified that I am speaking with the correct person using two identifiers. Jacob Cline is currently located at home and family is currently with them during visit. The provider, Jacob Baars, FNP is located in their office at time of visit.  I discussed the limitations, risks, security and privacy concerns of performing an evaluation and management service by telephone and the availability of in person appointments. I also discussed with the patient that there may be a patient responsible charge related to this service. The patient expressed understanding and agreed to proceed.  Subjective:  Patient ID: Jacob Cline, male    DOB: 01-20-1961, 58 y.o.   MRN: [id number]  Chief Complaint:  Sore throat, white spots on tongue  HPI: Jacob Cline is a 58 y.o. male presenting on 08/02/2018 for sore throat, white spots on tongue  Pt reports a slight sore throat and tender white spots on his tongue. Pt states he has recently been treated for strep throat and a sinus infection with 2 different antibiotics. Pt states he has finished the antibiotics. States he now has these bumps on his tongue. States today he started having a slight sore throat. He denies fever, chills, cough, shortness of breath, headache, or chest pain. No rhinorrhea or congestion. No recent travel. Granddaughter did have strep recently.    Relevant past medical, surgical, family, and social history reviewed and updated as indicated.  Allergies and medications reviewed and updated.   Past Medical History:  Diagnosis Date  . BPH (benign prostatic hypertrophy)   . Hemorrhoids   . Hiatal hernia   . Hyperlipidemia   . Hypertension   . Irregular heart beat   . Obesity   . Post-operative nausea and vomiting     Past Surgical History:  Procedure Laterality Date  . athroscopy of rt knee  02/02  . henia - umbilical surgery   07/17/07  . HERNIA REPAIR  05/15/2017   double hernia surgery  . HIP SURGERY  11/2014   right hip  . MOUTH SURGERY    . VASECTOMY      Social History   Socioeconomic History  . Marital status: Married    Spouse name: Not on file  . Number of children: Not on file  . Years of education: Not on file  . Highest education level: Not on file  Occupational History  . Not on file  Social Needs  . Financial resource strain: Not on file  . Food insecurity:    Worry: Not on file    Inability: Not on file  . Transportation needs:    Medical: Not on file    Non-medical: Not on file  Tobacco Use  . Smoking status: Never Smoker  . Smokeless tobacco: Never Used  Substance and Sexual Activity  . Alcohol use: No    Alcohol/week: 0.0 standard drinks  . Drug use: No  . Sexual activity: Not on file  Lifestyle  . Physical activity:    Days per week: Not on file    Minutes per session: Not on file  . Stress: Not on file  Relationships  . Social connections:    Talks on phone: Not on file    Gets together: Not on file    Attends religious service: Not on file    Active member of club or organization: Not on file    Attends meetings  of clubs or organizations: Not on file    Relationship status: Not on file  . Intimate partner violence:    Fear of current or ex partner: Not on file    Emotionally abused: Not on file    Physically abused: Not on file    Forced sexual activity: Not on file  Other Topics Concern  . Not on file  Social History Narrative  . Not on file    Outpatient Encounter Medications as of 08/02/2018  Medication Sig  . albuterol (PROVENTIL HFA;VENTOLIN HFA) 108 (90 Base) MCG/ACT inhaler Inhale 2 puffs into the lungs every 6 (six) hours as needed for wheezing or shortness of breath.  [name] aspirin 81 MG chewable tablet Chew 81 mg by mouth daily.    . cefdinir (OMNICEF) 300 MG capsule Take 1 capsule (300 mg total) by mouth 2 (two) times daily. 1 po BID  .  cholecalciferol (VITAMIN D) 1000 UNITS tablet Take 1,000 Units by mouth daily. 3 tablets daily  . Cinnamon 500 MG capsule Take 1,000 mg by mouth daily.   . diclofenac (VOLTAREN) 75 MG EC tablet Take 1 tablet by mouth twice daily  . fluticasone (FLONASE) 50 MCG/ACT nasal spray Place 2 sprays into both nostrils daily.  . Multiple Vitamin (MULTIVITAMIN) tablet Take 1 tablet by mouth daily.   [name] nystatin (MYCOSTATIN) 100000 UNIT/ML suspension Take 5 mLs (500,000 Units total) by mouth 4 (four) times daily. Swish and swallow, hold in mouth as long as possible before swallowing  . Omega-3 Fatty Acids (FISH OIL) 1000 MG CAPS Take 1 capsule by mouth daily.   [name] omeprazole (PRILOSEC) 20 MG capsule Take 1 capsule (20 mg total) by mouth 2 (two) times daily before a meal.  . PARoxetine (PAXIL-CR) 25 MG 24 hr tablet Take 1 tablet (25 mg total) by mouth daily.  . pravastatin (PRAVACHOL) 40 MG tablet Take 1 tablet (40 mg total) by mouth at bedtime.  . sildenafil (REVATIO) 20 MG tablet Take 2-5 tabs  . telmisartan (MICARDIS) 40 MG tablet Take 1 tablet (40 mg total) by mouth daily.  [name] triamterene-hydrochlorothiazide (MAXZIDE-25) 37.5-25 MG tablet Take 1 tablet by mouth daily.  . TURMERIC PO Take 1 capsule by mouth daily.    No facility-administered encounter medications on file as of 08/02/2018.     Allergies  Allergen Reactions  . Other Anaphylaxis    Make me hurt alot  . Pravastatin Other (See Comments)    Stiff neck  . Codeine     Unknown   . Lovastatin     Makes me hurt alot  . Niaspan [Niacin Er]     Make me hurt alot    Review of Systems  Constitutional: Negative for activity change, appetite change, chills, fatigue, fever and unexpected weight change.  HENT: Positive for mouth sores and sore throat. Negative for congestion, ear pain, postnasal drip, rhinorrhea, sinus pressure, sinus pain, sneezing, tinnitus, trouble swallowing and voice change.   Respiratory: Negative for cough, chest tightness,  shortness of breath and wheezing.   Cardiovascular: Negative for chest pain, palpitations and leg swelling.  Gastrointestinal: Negative for abdominal pain, nausea and vomiting.  Musculoskeletal: Negative for arthralgias and myalgias.  Skin: Negative for color change and pallor.  Neurological: Negative for dizziness, weakness, light-headedness and headaches.  Psychiatric/Behavioral: Negative for confusion.  All other systems reviewed and are negative.        Observations/Objective: Pt alert and oriented, answers all questions appropriately, and able to speak in full sentences.  Assessment and Plan: Huynh was seen today for sore throat.  Diagnoses and all orders for this visit:  Thrush, oral Due to pts reported signs and symptoms and recent use of antibiotics, this is likely thrush. Will treat with below. Report any new or worsening symptoms.  -     nystatin (MYCOSTATIN) 100000 UNIT/ML suspension; Take 5 mLs (500,000 Units total) by mouth 4 (four) times daily. Swish and swallow, hold in mouth as long as possible before swallowing  Sore throat Symptomatic care discussed. Report any new or worsening symptoms. Report development of fever.     Follow Up Instructions: Report any new or worsening symptoms. Report development of fever.     I discussed the assessment and treatment plan with the patient. The patient was provided an opportunity to ask questions and all were answered. The patient agreed with the plan and demonstrated an understanding of the instructions.   The patient was advised to call back or seek an in-person evaluation if the symptoms worsen or if the condition fails to improve as anticipated.  The above assessment and management plan was discussed with the patient. The patient verbalized understanding of and has agreed to the management plan. Patient is aware to call the clinic if symptoms persist or worsen. Patient is aware when to return to the clinic for a  follow-up visit. Patient educated on when it is appropriate to go to the emergency department.    I provided 12 minutes of non-face-to-face time during this encounter. The call started at 1340. The call ended at 1352.   Jacob Baars, FNP-C [facility] [address] [place], [place] [address] [phone]

## 2018-08-10 ENCOUNTER — Telehealth: Payer: Self-pay | Admitting: Family Medicine

## 2018-08-10 NOTE — Telephone Encounter (Signed)
Pt is wanting to know if Dr Christell Constant thinks it is a good idea for him to come in and have shingles shot right now due to COVID 19.  Also pt states he was seen by [facility] on 2/19 for sore throat given one medication, then on 2/24 was seen by Dr Christell Constant and given another medication, pt is wondering if he could have had COVID 19

## 2018-08-10 NOTE — Telephone Encounter (Signed)
Doubt that patient has had coronavirus that early as no other cases were being reported in the immediate area then.  Call him regarding second Shingrix shot.

## 2018-08-10 NOTE — Telephone Encounter (Signed)
Discussed covid-19 with pt  Coming MON for shingrix

## 2018-08-10 NOTE — Telephone Encounter (Signed)
Called pt - overdue for shingrix 2 nd shot

## 2018-08-13 ENCOUNTER — Other Ambulatory Visit: Payer: Self-pay

## 2018-08-13 ENCOUNTER — Ambulatory Visit (INDEPENDENT_AMBULATORY_CARE_PROVIDER_SITE_OTHER): Payer: Managed Care, Other (non HMO) | Admitting: *Deleted

## 2018-08-13 DIAGNOSIS — Z23 Encounter for immunization: Secondary | ICD-10-CM | POA: Diagnosis not present

## 2018-09-25 ENCOUNTER — Ambulatory Visit (INDEPENDENT_AMBULATORY_CARE_PROVIDER_SITE_OTHER): Payer: Managed Care, Other (non HMO) | Admitting: Family Medicine

## 2018-09-25 ENCOUNTER — Encounter: Payer: Self-pay | Admitting: Family Medicine

## 2018-09-25 ENCOUNTER — Other Ambulatory Visit: Payer: Self-pay

## 2018-09-25 DIAGNOSIS — S70361A Insect bite (nonvenomous), right thigh, initial encounter: Secondary | ICD-10-CM | POA: Diagnosis not present

## 2018-09-25 DIAGNOSIS — W57XXXA Bitten or stung by nonvenomous insect and other nonvenomous arthropods, initial encounter: Secondary | ICD-10-CM | POA: Diagnosis not present

## 2018-09-25 MED ORDER — DOXYCYCLINE HYCLATE 100 MG PO CAPS: 100.0000 mg | ORAL_CAPSULE | Freq: Two times a day (BID) | ORAL | 0 refills | Status: DC

## 2018-09-25 NOTE — Progress Notes (Signed)
    Subjective:    Patient ID: Jacob Cline, male    DOB: [birth year], 58 y.o.   MRN: [id number]   HPI: Jacob Cline is a 58 y.o. male presenting for red bump that was itching first noticed last week.. Didn't realize it was a tick at first. Then it spread to make three bumps. Located on inner right groin.    Depression screen [facility] 2/9 07/02/2018 06/27/2018 12/18/2017 06/21/2017 04/11/2017  Decreased Interest 0 0 0 0 0  Down, Depressed, Hopeless 0 0 0 0 0  PHQ - 2 Score 0 0 0 0 0     Relevant past medical, surgical, family and social history reviewed and updated as indicated.  Interim medical history since our last visit reviewed. Allergies and medications reviewed and updated.  ROS:  Review of Systems  Constitutional: Negative for chills, diaphoresis and fever.  Respiratory: Negative for shortness of breath.   Cardiovascular: Negative for chest pain.  Musculoskeletal: Positive for neck pain (a little sore (possibly from work activity). Negative for arthralgias.  Skin: Negative for rash.     Social History   Tobacco Use  Smoking Status Never Smoker  Smokeless Tobacco Never Used       Objective:     Wt Readings from Last 3 Encounters:  07/02/18 244 lb (110.7 kg)  06/27/18 246 lb (111.6 kg)  04/23/18 245 lb 12.8 oz (111.5 kg)     Exam deferred. Pt. Harboring due to COVID 19. Phone visit performed.   Assessment & Plan:   1. Tick bite, initial encounter     Meds ordered this encounter  Medications  . doxycycline (VIBRAMYCIN) 100 MG capsule    Sig: Take 1 capsule (100 mg total) by mouth 2 (two) times daily.    Dispense:  20 capsule    Refill:  0    No orders of the defined types were placed in this encounter.     Diagnoses and all orders for this visit:  Tick bite, initial encounter  Other orders -     doxycycline (VIBRAMYCIN) 100 MG capsule; Take 1 capsule (100 mg total) by mouth 2 (two) times daily.    Virtual Visit via telephone Note  I discussed  the limitations, risks, security and privacy concerns of performing an evaluation and management service by telephone and the availability of in person appointments. The patient was identified with two identifiers. Pt.expressed understanding and agreed to proceed. Pt. Is at home. Dr. Darlyn Read is in his office.  Follow Up Instructions:   I discussed the assessment and treatment plan with the patient. The patient was provided an opportunity to ask questions and all were answered. The patient agreed with the plan and demonstrated an understanding of the instructions.   The patient was advised to call back or seek an in-person evaluation if the symptoms worsen or if the condition fails to improve as anticipated.   Total minutes including chart review and phone contact time: 12   Follow up plan: Return if symptoms worsen or fail to improve.  Mechele Claude, MD [facility]

## 2018-10-02 ENCOUNTER — Telehealth: Payer: Self-pay | Admitting: Family Medicine

## 2018-10-02 NOTE — Telephone Encounter (Signed)
Requesting to speak to Asher Muir - aware she will return call 5/27.

## 2018-10-03 NOTE — Telephone Encounter (Signed)
Pt called = he came in and seen stacks for tick bite.  On DOXY - just questioned getting some labs = aware no s/s - will not do at this time.

## 2018-10-17 ENCOUNTER — Telehealth: Payer: Self-pay | Admitting: Family Medicine

## 2018-10-17 MED ORDER — DOXYCYCLINE HYCLATE 100 MG PO TABS: 100.0000 mg | ORAL_TABLET | Freq: Two times a day (BID) | ORAL | 0 refills | Status: DC

## 2018-10-17 NOTE — Telephone Encounter (Signed)
Pt called

## 2018-10-17 NOTE — Telephone Encounter (Signed)
Patient requesting conversation with nurse.

## 2018-10-18 ENCOUNTER — Other Ambulatory Visit: Payer: Self-pay

## 2018-10-18 ENCOUNTER — Ambulatory Visit (INDEPENDENT_AMBULATORY_CARE_PROVIDER_SITE_OTHER): Payer: Managed Care, Other (non HMO) | Admitting: Family

## 2018-10-18 ENCOUNTER — Encounter: Payer: Self-pay | Admitting: Family

## 2018-10-18 VITALS — BP 115/78 | HR 68 | Temp 96.9°F | Ht 70.0 in | Wt 253.4 lb

## 2018-10-18 DIAGNOSIS — S30860D Insect bite (nonvenomous) of lower back and pelvis, subsequent encounter: Secondary | ICD-10-CM

## 2018-10-18 DIAGNOSIS — W57XXXD Bitten or stung by nonvenomous insect and other nonvenomous arthropods, subsequent encounter: Secondary | ICD-10-CM

## 2018-10-18 NOTE — Progress Notes (Signed)
   Subjective:    Patient ID: Jacob Cline, male    DOB: Jul 28, 1960, 58 y.o.   MRN: [id number]  Chief Complaint  Patient presents with  . Tick Removal    HPI PT presents to the office today with a tick bite that he removed on Tuesday after being outside on Monday evening. He states he noticed his back was itching and asked his wife to check his back and found it was a tick. They removed it and "think they got it all".  He called the office yesterday and was started on doxycyline.   Denies fever, rash, or joint pain. States he does have a small headache after working third shift and is unsure if this is related.    He states he woke up this morning and stated the area was "itching".    Review of Systems  All other systems reviewed and are negative.      Objective:   Physical Exam Vitals signs reviewed.  Constitutional:      General: He is not in acute distress.    Appearance: He is well-developed.  HENT:     Head: Normocephalic.  Neck:     Musculoskeletal: Normal range of motion and neck supple.     Thyroid: No thyromegaly.  Cardiovascular:     Rate and Rhythm: Normal rate and regular rhythm.     Heart sounds: Normal heart sounds. No murmur.  Pulmonary:     Effort: Pulmonary effort is normal. No respiratory distress.     Breath sounds: Normal breath sounds. No wheezing.  Abdominal:     General: Bowel sounds are normal. There is no distension.     Palpations: Abdomen is soft.     Tenderness: There is no abdominal tenderness.  Musculoskeletal: Normal range of motion.        General: No tenderness.  Skin:    General: Skin is warm and dry.     Findings: No erythema or rash.          Comments: Tick head with approx 2X2 in of readness.  Neurological:     Mental Status: He is alert and oriented to person, place, and time.     Cranial Nerves: No cranial nerve deficit.     Deep Tendon Reflexes: Reflexes are normal and symmetric.  Psychiatric:        Behavior: Behavior  normal.        Thought Content: Thought content normal.        Judgment: Judgment normal.    Area cleaned and tick removed.    BP 115/78   Pulse 68   Temp (!) 96.9 F (36.1 C) (Oral)   Ht 5\' 10"  (1.778 m)   Wt 253 lb 6.4 oz (114.9 kg)   BMI 36.36 kg/m      Assessment & Plan:  1. Tick bite of back, subsequent encounter Continue doxycyline -Pt to report any new fever, joint pain, or rash -Wear protective clothing while outside- Long sleeves and long pants -Put insect repellent on all exposed skin and along clothing -Take a shower as soon as possible after being outside -RTO if symptoms do not improve   Evelina Dun, FNP

## 2018-10-18 NOTE — Patient Instructions (Signed)

## 2018-11-12 ENCOUNTER — Other Ambulatory Visit: Payer: Self-pay

## 2018-11-12 ENCOUNTER — Ambulatory Visit (INDEPENDENT_AMBULATORY_CARE_PROVIDER_SITE_OTHER): Payer: Managed Care, Other (non HMO) | Admitting: Family Medicine

## 2018-11-12 ENCOUNTER — Encounter: Payer: Self-pay | Admitting: Family Medicine

## 2018-11-12 VITALS — BP 119/78 | HR 66 | Temp 97.2°F | Ht 70.0 in | Wt 255.4 lb

## 2018-11-12 DIAGNOSIS — R238 Other skin changes: Secondary | ICD-10-CM | POA: Diagnosis not present

## 2018-11-12 NOTE — Progress Notes (Signed)
BP 119/78   Pulse 66   Temp (!) 97.2 F (36.2 C) (Oral)   Ht 5\' 10"  (1.778 m)   Wt 255 lb 6.4 oz (115.8 kg)   BMI 36.65 kg/m    Subjective:   Patient ID: Jacob Cline, male    DOB: May 12, 1960, 58 y.o.   MRN: [id number]  HPI: Jacob Cline is a 58 y.o. male presenting on 11/12/2018 for Blister (left top of hand- x 4 days- insect bite?)   HPI Patient noticed a spot on the top of his left hand near the second MCP joint that started as a small blister and then he scratched it because it is very pruritic and it spread and now he has a large blister that is on the back of his hand.  He denies any pain or redness or warmth or drainage from it.  It looks clear on the inside and has looks cleared on the inside since the beginning.  Relevant past medical, surgical, family and social history reviewed and updated as indicated. Interim medical history since our last visit reviewed. Allergies and medications reviewed and updated.  Review of Systems  Constitutional: Negative for chills and fever.  Respiratory: Negative for shortness of breath and wheezing.   Cardiovascular: Negative for chest pain and leg swelling.  Musculoskeletal: Negative for back pain and gait problem.  Skin: Negative for color change and rash.  All other systems reviewed and are negative.   Per HPI unless specifically indicated above      Objective:   BP 119/78   Pulse 66   Temp (!) 97.2 F (36.2 C) (Oral)   Ht 5\' 10"  (1.778 m)   Wt 255 lb 6.4 oz (115.8 kg)   BMI 36.65 kg/m   Wt Readings from Last 3 Encounters:  11/12/18 255 lb 6.4 oz (115.8 kg)  10/18/18 253 lb 6.4 oz (114.9 kg)  07/02/18 244 lb (110.7 kg)    Physical Exam Vitals signs and nursing note reviewed.  Constitutional:      General: He is not in acute distress.    Appearance: He is well-developed. He is not diaphoretic.  Eyes:     General: No scleral icterus.    Conjunctiva/sclera: Conjunctivae normal.  Neck:     Thyroid: No  thyromegaly.  Skin:    General: Skin is warm and dry.     Findings: Lesion present. No rash.       Neurological:     Mental Status: He is alert and oriented to person, place, and time.     Coordination: Coordination normal.  Psychiatric:        Behavior: Behavior normal.     Used 25-gauge needle to lance and expressed clear straw-colored fluid, sent for culture  Assessment & Plan:   Problem List Items Addressed This Visit    None    Visit Diagnoses    Skin bulla    -  Primary   Relevant Orders   Anaerobic and Aerobic Culture      Recommended compression over the next 24 hours and topical antibiotic cream to prevent infection.  Likely just from abrasion and scratching that is spread this, he appears to have very fragile skin on the other parts of both of his hands Follow up plan: Return if symptoms worsen or fail to improve.  Counseling provided for all of the vaccine components Orders Placed This Encounter  Procedures  . Anaerobic and Aerobic Culture    Arville CareJoshua Dettinger, Jacob Cline [facility]  [place] 11/12/2018, 9:28 AM

## 2018-11-16 ENCOUNTER — Telehealth: Payer: Self-pay | Admitting: Cardiovascular Disease

## 2018-11-16 ENCOUNTER — Encounter: Payer: Self-pay | Admitting: Cardiovascular Disease

## 2018-11-16 LAB — ANAEROBIC AND AEROBIC CULTURE

## 2018-11-16 NOTE — Telephone Encounter (Signed)

## 2018-11-19 ENCOUNTER — Encounter: Payer: Self-pay | Admitting: Cardiovascular Disease

## 2018-11-19 ENCOUNTER — Other Ambulatory Visit: Payer: Self-pay

## 2018-11-19 ENCOUNTER — Ambulatory Visit (INDEPENDENT_AMBULATORY_CARE_PROVIDER_SITE_OTHER): Payer: Managed Care, Other (non HMO) | Admitting: Cardiovascular Disease

## 2018-11-19 VITALS — BP 106/70 | HR 65 | Ht 70.0 in | Wt 254.4 lb

## 2018-11-19 DIAGNOSIS — I712 Thoracic aortic aneurysm, without rupture, unspecified: Secondary | ICD-10-CM

## 2018-11-19 DIAGNOSIS — I10 Essential (primary) hypertension: Secondary | ICD-10-CM

## 2018-11-19 DIAGNOSIS — E7800 Pure hypercholesterolemia, unspecified: Secondary | ICD-10-CM

## 2018-11-19 LAB — BASIC METABOLIC PANEL
BUN/Creatinine Ratio: 24 (ref 10–24)
BUN: 22 mg/dL (ref 8–27)
CO2: 25 mmol/L (ref 20–29)
Calcium: 8.9 mg/dL (ref 8.6–10.2)
Chloride: 98 mmol/L (ref 96–106)
Creatinine, Ser: 0.91 mg/dL (ref 0.76–1.27)
GFR calc Af Amer: 106 mL/min/{1.73_m2} (ref >59)
GFR calc non Af Amer: 91 mL/min/{1.73_m2} (ref >59)
Glucose: 107 mg/dL — ABNORMAL HIGH (ref 65–99)
Potassium: 4.1 mmol/L (ref 3.5–5.2)
Sodium: 137 mmol/L (ref 134–144)

## 2018-11-19 NOTE — Patient Instructions (Addendum)
Medication Instructions:  Your physician recommends that you continue on your current medications as directed. Please refer to the Current Medication list given to you today.  If you need a refill on your cardiac medications before your next appointment, please call your pharmacy.    Lab work: TODAY - basic metabolic panel   Testing/Procedures: Non-Cardiac CT Angiography (CTA), is a special type of CT scan that uses a computer to produce multi-dimensional views of major blood vessels throughout the body. In CT angiography, a contrast material is injected through an IV to help visualize the blood vessels   Follow-Up: At [facility], you and your health needs are our priority.  As part of our continuing mission to provide you with exceptional heart care, we have created designated Provider Care Teams.  These Care Teams include your primary Cardiologist (physician) and Advanced Practice Providers (APPs -  Physician Assistants and Nurse Practitioners) who all work together to provide you with the care you need, when you need it. You will need a follow up appointment in:  1 years.  Please call our office 2 months in advance to schedule this appointment.  You may see Mertie Moores, MD or one of the following Advanced Practice Providers on your designated Care Team: Richardson Dopp, PA-C [place], [place] . Daune Perch, NP

## 2018-11-19 NOTE — Progress Notes (Signed)
Jacob Cline Date of Birth  August 17, 1960       [facility]    [employer] [address], [address], [place], [place]  [address]   [place], [place]  [address] [phone]     [phone]   Fax  [phone]    Fax [phone]  Problem List: 1. Chest pain:  seen in the emergency room 2. Hypertension 3. Hyperlipidemia      I have seen Jacob Cline in the past for CP and abnormal ECG.  We have performed a cath in the past which was normal.   Jacob Cline also has had a stress myoview which was normal.    Jacob Cline had an episode of chest heaviness about a month ago.  Also associated with a head ache.  Was seen in the ER, work up was negative.   Jacob Cline has had some occasional recurent chest pain,.  Dull , shooting pain, comes and goes.   Also seems to have chest heaviness that comes on at random times ( not associated with any specific activity) which Seems to be relived with antiacids.  Has not seen his medical doctor.    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    Jacob Cline does not get any regular exercise.   Jacob Cline has taken Lovastatin in the past but stopped taking it due to  muscle aches.    Sep 10, 2013:  Jacob Cline was last seen in January, 2015. Jacob Cline had a stress Myoview study for recurrent episodes of chest discomfort.  The Myoview showed no evidence of ischemia. His left ventricular systolic function was normal with an EF of 57%.  Still having some CP.   Jacob Cline works 14 hour days ( 2 AM to 4 PM)   so has limited time to exercise.  Tries not to eat any fried foods by sometimes does not have a good option.   November 03, 2014:  Jacob Cline is doing well.  No CP.  Able to do all of his normal activities without any limitation .   Was seen by Dr. Laurance Flatten for pre-op visit and his ECG was found to be abnormal .    Jacob Cline's had T-wave inversions in the anterolateral leads for the past several years. Jacob Cline had a Myoview study last year which was normal. Jacob Cline had no evidence of  ischemia. Jacob Cline's done very well and has not had any cardiac symptoms.  April 23, 2018: Ms. seen back today for follow-up visit.  Jacob Cline was last seen 3 years ago. Jacob Cline has baseline T wave inversions in his lateral leads on his EKG. Is here for a check up  Has not had any particular issues.   No CP , no dyspnea. Works - drives a Actuary  Has developed leg swelling and chronic stasis changs  Still eats fast  Food  No regular exercise   November 19, 2018:  Jacob Cline is seen today for follow up for his CP and HTN Echocardiogram April 30, 2018 reveals normal left ventricular systolic function.  Jacob Cline has grade 2 diastolic dysfunction.  Jacob Cline has mild dilatation of the ascending aorta.  Doing well .   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 albuterol (PROVENTIL HFA;VENTOLIN HFA) 108 (90 Base) MCG/ACT inhaler Inhale 2 puffs into the lungs every  6 (six) hours as needed for wheezing or shortness of breath. 1 Inhaler 1  . aspirin 81 MG chewable tablet Chew 81 mg by mouth daily.      . cholecalciferol (VITAMIN D) 1000 UNITS tablet Take 1,000 Units by mouth daily. 3 tablets daily    . Cinnamon 500 MG capsule Take 1,000 mg by mouth daily.     . diclofenac (VOLTAREN) 75 MG EC tablet Take 1 tablet by mouth twice daily 60 tablet 2  . fluticasone (FLONASE) 50 MCG/ACT nasal spray Place 2 sprays into both nostrils daily. 16 g 11  . fluticasone (FLONASE) 50 MCG/ACT nasal spray Place 1 spray into both nostrils as needed for allergies or rhinitis.    . Multiple Vitamin (MULTIVITAMIN) tablet Take 1 tablet by mouth daily.     . Omega-3 Fatty Acids (FISH OIL) 1000 MG CAPS Take 1 capsule by mouth daily.     [name]. omeprazole (PRILOSEC) 20 MG capsule Take 1 capsule (20 mg total) by mouth 2 (two) times daily before a meal. 180 capsule 3  . PARoxetine (PAXIL-CR) 25 MG 24 hr tablet Take 1 tablet (25  mg total) by mouth daily. 90 tablet 3  . pravastatin (PRAVACHOL) 40 MG tablet Take 1 tablet (40 mg total) by mouth at bedtime. 90 tablet 3  . sildenafil (REVATIO) 20 MG tablet Take 2-5 tabs 50 tablet 2  . telmisartan (MICARDIS) 40 MG tablet Take 1 tablet (40 mg total) by mouth daily. 90 tablet 3  . triamterene-hydrochlorothiazide (MAXZIDE-25) 37.5-25 MG tablet Take 1 tablet by mouth daily. 90 tablet 3  . TURMERIC PO Take 1 capsule by mouth daily.      No current facility-administered medications on file prior to visit.     Allergies  Allergen Reactions  . Other Anaphylaxis    Make me hurt alot  . Pravastatin Other (See Comments)    Stiff neck  . Codeine     Unknown   . Lovastatin     Makes me hurt alot  . Niaspan [Niacin Er]     Make me hurt alot    Past Medical History:  Diagnosis Date  . BPH (benign prostatic hypertrophy)   . Hemorrhoids   . Hiatal hernia   . Hyperlipidemia   . Hypertension   . Irregular heart beat   . Obesity   . Post-operative nausea and vomiting     Past Surgical History:  Procedure Laterality Date  . athroscopy of rt knee  02/02  . henia - umbilical surgery  07/17/07  . HERNIA REPAIR  05/15/2017   double hernia surgery  . HIP SURGERY  11/2014   right hip  . MOUTH SURGERY    . VASECTOMY      Social History   Tobacco Use  Smoking Status Never Smoker  Smokeless Tobacco Never Used    Social History   Substance and Sexual Activity  Alcohol Use No  . Alcohol/week: 0.0 standard drinks    Family History  Problem Relation Age of Onset  . Diabetes Mother   . Scleroderma Sister   . Diabetes Brother   . Diabetes Brother   . Colon cancer Other        family history   . Prostate cancer Other        family history     Reviw of Systems:  Reviewed in the HPI.  All other systems are negative.   Physical Exam: Blood pressure 106/70, pulse 65, height 5\' 10"  (1.778 m), weight  254 lb 6.4 oz (115.4 kg), SpO2 95 %.  GEN:  Well nourished,  well developed in no acute distress HEENT: Normal NECK: No JVD; No carotid bruits LYMPHATICS: No lymphadenopathy CARDIAC: RRR , no murmurs, rubs, gallops RESPIRATORY:  Clear to auscultation without rales, wheezing or rhonchi  ABDOMEN: Soft, non-tender, non-distended MUSCULOSKELETAL:  No edema; No deformity  SKIN: Warm and dry NEUROLOGIC:  Alert and oriented x 3    ECG: Dec. 12, 2014:  NSR TWI in the ant. lat  Jan. 20, 2015:  The TWI have improved.   November 03, 2014:  NSR at 73.  Minimal criteria for LVH.  NS T wave abn. No significant changes from previous tracing  Dec. 16, 2019:   NSR at 69 beats minute.  T wave inversions in leads V3 through V6.   Assessment / Plan:    1. Chest pain:    No further episodes of CP   2. Hypertension -blood pressure is well controlled.  Continue current medications.  His labs look great.  3.  Mild leg edema: Jacob Cline is concerned about his chronic stasis changes.  I have advised him to work on a weight loss program.  I have also advised that Jacob Cline wear compression hose.  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  4.  Obesity: Body mass index is 36.5 kg/m. Advised weight loss    4. Hyperlipidemia Managed by his primary    Jacob MissPhilip Kylo Gavin, MD  11/19/2018 8:18 AM    [facility] [address],  [address] [place], [place]  [address] Pager [phone]- [phone] Phone: [phone]; Fax: [phone]

## 2018-12-24 ENCOUNTER — Other Ambulatory Visit: Payer: Self-pay

## 2018-12-24 ENCOUNTER — Ambulatory Visit (INDEPENDENT_AMBULATORY_CARE_PROVIDER_SITE_OTHER)
Admission: RE | Admit: 2018-12-24 | Discharge: 2018-12-24 | Disposition: A | Discharge status: Home / Self Care | Payer: Managed Care, Other (non HMO) | Source: Ambulatory Visit | Attending: Cardiovascular Disease | Admitting: Cardiovascular Disease

## 2018-12-24 ENCOUNTER — Other Ambulatory Visit: Payer: Self-pay | Admitting: Nurse Practitioner

## 2018-12-24 DIAGNOSIS — I712 Thoracic aortic aneurysm, without rupture, unspecified: Secondary | ICD-10-CM

## 2018-12-24 MED ORDER — IOHEXOL 350 MG/ML SOLN
100.0000 mL | Freq: Once | INTRAVENOUS | Status: AC | PRN
  Administered 2018-12-24: 10:00:00 100 mL via INTRAVENOUS

## 2018-12-31 ENCOUNTER — Ambulatory Visit: Payer: Managed Care, Other (non HMO) | Admitting: Family Medicine

## 2019-01-01 ENCOUNTER — Telehealth: Payer: Self-pay | Admitting: Family Medicine

## 2019-01-01 NOTE — Telephone Encounter (Signed)
Talked to Jamie °

## 2019-01-04 ENCOUNTER — Other Ambulatory Visit: Payer: Self-pay

## 2019-01-07 ENCOUNTER — Ambulatory Visit (INDEPENDENT_AMBULATORY_CARE_PROVIDER_SITE_OTHER): Payer: Managed Care, Other (non HMO) | Admitting: Family Medicine

## 2019-01-07 DIAGNOSIS — N50811 Right testicular pain: Secondary | ICD-10-CM | POA: Diagnosis not present

## 2019-01-07 DIAGNOSIS — J302 Other seasonal allergic rhinitis: Secondary | ICD-10-CM

## 2019-01-07 NOTE — Progress Notes (Signed)
Telephone visit  Subjective: CC: urinary symptoms PCP: Baruch Gouty, FNP Jacob Cline is a 58 y.o. male calls for telephone consult today. Patient provides verbal consent for consult held via phone.  Location of patient: home Location of provider: Working remotely from home Others present for call: none  1. Testicular pain Patient reports tenderness in right testicle for about 1 year.  He notes that if it is touched/ bumped it hurts.  No difficulty with urination.  He had a hernia surgery about 1.5 years ago.  He saw the surgeon and he did not think the two were related. Denies palpable lumps, redness, heat or swelling.  He is sexually active and is in a monogamous relationship.  Family history is negative for testicular and prostate cancer but he does not know about his family history.  2.  Rhinorrhea Patient reports a one-week history of rhinorrhea with subsequent postnasal drip and cough.  He notes that his phlegm is clear.  Has had no fevers, shortness of breath or hemoptysis.  He just got an over-the-counter Claritin to start.  He has Flonase at home and has been using Mucinex DM to help with the mild cough he has.  No known sick contacts.  ROS: Per HPI  Allergies  Allergen Reactions  . Other Anaphylaxis    Make me hurt alot  . Pravastatin Other (See Comments)    Stiff neck  . Codeine     Unknown   . Lovastatin     Makes me hurt alot  . Niaspan [Niacin Er]     Make me hurt alot   Past Medical History:  Diagnosis Date  . BPH (benign prostatic hypertrophy)   . Hemorrhoids   . Hiatal hernia   . Hyperlipidemia   . Hypertension   . Irregular heart beat   . Obesity   . Post-operative nausea and vomiting     Current Outpatient Medications:  .  albuterol (PROVENTIL HFA;VENTOLIN HFA) 108 (90 Base) MCG/ACT inhaler, Inhale 2 puffs into the lungs every 6 (six) hours as needed for wheezing or shortness of breath., Disp: 1 Inhaler, Rfl: 1 .  aspirin 81 MG chewable tablet,  Chew 81 mg by mouth daily.  , Disp: , Rfl:  .  cholecalciferol (VITAMIN D) 1000 UNITS tablet, Take 1,000 Units by mouth daily. 3 tablets daily, Disp: , Rfl:  .  Cinnamon 500 MG capsule, Take 1,000 mg by mouth daily. , Disp: , Rfl:  .  diclofenac (VOLTAREN) 75 MG EC tablet, Take 1 tablet by mouth twice daily, Disp: 60 tablet, Rfl: 2 .  fluticasone (FLONASE) 50 MCG/ACT nasal spray, Place 2 sprays into both nostrils daily., Disp: 16 g, Rfl: 11 .  fluticasone (FLONASE) 50 MCG/ACT nasal spray, Place 1 spray into both nostrils as needed for allergies or rhinitis., Disp: , Rfl:  .  Multiple Vitamin (MULTIVITAMIN) tablet, Take 1 tablet by mouth daily. , Disp: , Rfl:  .  Omega-3 Fatty Acids (FISH OIL) 1000 MG CAPS, Take 1 capsule by mouth daily. , Disp: , Rfl:  .  omeprazole (PRILOSEC) 20 MG capsule, Take 1 capsule (20 mg total) by mouth 2 (two) times daily before a meal., Disp: 180 capsule, Rfl: 3 .  PARoxetine (PAXIL-CR) 25 MG 24 hr tablet, Take 1 tablet (25 mg total) by mouth daily., Disp: 90 tablet, Rfl: 3 .  pravastatin (PRAVACHOL) 40 MG tablet, Take 1 tablet (40 mg total) by mouth at bedtime., Disp: 90 tablet, Rfl: 3 .  sildenafil (  REVATIO) 20 MG tablet, Take 2-5 tabs, Disp: 50 tablet, Rfl: 2 .  telmisartan (MICARDIS) 40 MG tablet, Take 1 tablet (40 mg total) by mouth daily., Disp: 90 tablet, Rfl: 3 .  triamterene-hydrochlorothiazide (MAXZIDE-25) 37.5-25 MG tablet, Take 1 tablet by mouth daily., Disp: 90 tablet, Rfl: 3 .  TURMERIC PO, Take 1 capsule by mouth daily. , Disp: , Rfl:   Assessment/ Plan: 58 y.o. male   1. Testicular pain, right Uncertain etiology.  To this point, he has not had any imaging.  I have ordered an ultrasound as well as place a referral to urology.  Unfortunately his family history on his father's side is unknown.  We will make a plan pending imaging and urologic recommendations. - US SCROTUM W/DOPPLER; Future - Ambulatory referral to Urology  2. Seasonal allergies I have  encouraged him to start Claritin daily.  He is to continue his Flonase.  If symptoms do not respond to the aforementioned therapies, we can add Astelin for rhinorrhea.  At this time, I do not have a high suspicion for infectious etiology.   Start time: 11:11am End time: 11:24am  Total time spent on patient care (including telephone call/ virtual visit): 18 minutes  Benjaman Artman Hulen SkainsM Clois Treanor, DO [facility] [phone]

## 2019-01-21 ENCOUNTER — Ambulatory Visit (INDEPENDENT_AMBULATORY_CARE_PROVIDER_SITE_OTHER): Payer: Managed Care, Other (non HMO)

## 2019-01-21 ENCOUNTER — Other Ambulatory Visit: Payer: Self-pay

## 2019-01-21 DIAGNOSIS — N50811 Right testicular pain: Secondary | ICD-10-CM | POA: Diagnosis not present

## 2019-02-08 ENCOUNTER — Other Ambulatory Visit: Payer: Self-pay

## 2019-02-11 ENCOUNTER — Ambulatory Visit (INDEPENDENT_AMBULATORY_CARE_PROVIDER_SITE_OTHER): Payer: Managed Care, Other (non HMO) | Admitting: Family Medicine

## 2019-02-11 ENCOUNTER — Encounter: Payer: Self-pay | Admitting: Family Medicine

## 2019-02-11 ENCOUNTER — Ambulatory Visit: Payer: Managed Care, Other (non HMO) | Admitting: Family Medicine

## 2019-02-11 ENCOUNTER — Other Ambulatory Visit: Payer: Self-pay

## 2019-02-11 VITALS — BP 112/69 | HR 65 | Temp 97.3°F | Resp 20 | Ht 70.0 in | Wt 257.0 lb

## 2019-02-11 DIAGNOSIS — E7800 Pure hypercholesterolemia, unspecified: Secondary | ICD-10-CM

## 2019-02-11 DIAGNOSIS — Z23 Encounter for immunization: Secondary | ICD-10-CM

## 2019-02-11 DIAGNOSIS — J9801 Acute bronchospasm: Secondary | ICD-10-CM | POA: Diagnosis not present

## 2019-02-11 DIAGNOSIS — F339 Major depressive disorder, recurrent, unspecified: Secondary | ICD-10-CM | POA: Insufficient documentation

## 2019-02-11 DIAGNOSIS — I10 Essential (primary) hypertension: Secondary | ICD-10-CM

## 2019-02-11 DIAGNOSIS — E559 Vitamin D deficiency, unspecified: Secondary | ICD-10-CM

## 2019-02-11 MED ORDER — PREDNISONE 20 MG PO TABS: ORAL_TABLET | ORAL | 0 refills | Status: DC

## 2019-02-11 MED ORDER — ALBUTEROL SULFATE HFA 108 (90 BASE) MCG/ACT IN AERS: 2.0000 | INHALATION_SPRAY | Freq: Four times a day (QID) | RESPIRATORY_TRACT | 11 refills | Status: DC | PRN

## 2019-02-11 NOTE — Progress Notes (Signed)
Subjective:  Patient ID: Jacob Cline, male    DOB: 1960-08-24, 59 y.o.   MRN: [id number]  Patient Care Team: Baruch Gouty, FNP as PCP - General (Family Medicine) Nahser, Wonda Cheng, MD as PCP - Cardiology (Cardiology)   Chief Complaint:  Medical Management of Chronic Issues, Hypertension, and Hyperlipidemia   HPI: [name] is a 58 y.o. male presenting on 02/11/2019 for Medical Management of Chronic Issues, Hypertension, and Hyperlipidemia  1. Pure hypercholesterolemia  Compliant with medications - Yes Current medications - pravastatin Side effects from medications - No Diet - generally healthy when home, not so healthy when working Exercise - none  Lab Results  Component Value Date   CHOL 142 07/02/2018   HDL 37 (L) 07/02/2018   LDLCALC 85 07/02/2018   TRIG 98 07/02/2018   CHOLHDL 3.8 07/02/2018     Family and personal medical history reviewed. Smoking and ETOH history reviewed.    2. Essential hypertension  Complaint with meds - Yes Current Medications - Maxzide, Micardis Checking BP at home - No Exercising Regularly - No Watching Salt intake - Yes Pertinent ROS:  Headache - No Fatigue - No Visual Disturbances - No Chest pain - No Dyspnea - at times with exertion Palpitations - No LE edema - No They report good compliance with medications and can restate their regimen by memory. No medication side effects.  Family, social, and smoking history reviewed.   BP Readings from Last 3 Encounters:  02/11/19 112/69  11/19/18 106/70  11/12/18 119/78   CMP Latest Ref Rng & Units 11/19/2018 07/02/2018 05/14/2018  Glucose 65 - 99 mg/dL 107(H) 101(H) 99  BUN 8 - 27 mg/dL _0 Creatinine 0.76 - 1.27 mg/dL 0.91 0.77 0.84  Sodium 134 - 144 mmol/L 137 139 140  Potassium 3.5 - 5.2 mmol/L 4.1 4.1 4.2  Chloride 96 - 106 mmol/L 98 97 101  CO2 20 - 29 mmol/L _1 Calcium 8.6 - 10.2 mg/dL 8.9 8.8 8.4(L)  Total Protein 6.0 - 8.5 g/dL - 6.6 -  Total Bilirubin  0.0 - 1.2 mg/dL - 0.6 -  Alkaline Phos 39 - 117 IU/L - 101 -  AST 0 - 40 IU/L - 26 -  ALT 0 - 44 IU/L - 50(H) -      3. Vitamin D deficiency  Pt is taking oral repletion therapy. Denies bone pain and tenderness, muscle weakness, fracture, and difficulty walking. Lab Results  Component Value Date   VD25OH 39.1 07/02/2018   VD25OH 37.2 10/25/2017   VD25OH 36.3 02/21/2017   Lab Results  Component Value Date   CALCIUM 8.9 11/19/2018      4. Bronchospasm  Pt reports intermittent cough and wheezing. States this only happens with significant exertion. No chest pain, shortness of breath, fever, chills, syncope, or dizziness. States this has been ongoing for about 6-8 months. States he does use his albuterol inhaler at times with great relief of symptoms.    5. Morbid obesity ([place])  Does not exercise on a regular basis. Does try to watch diet, but it is not healthy all of the time.      Relevant past medical, surgical, family, and social history reviewed and updated as indicated.  Allergies and medications reviewed and updated. Date reviewed: Chart in Epic.   Past Medical History:  Diagnosis Date  . BPH (benign prostatic hypertrophy)   . Hemorrhoids   . Hiatal hernia   . Hyperlipidemia   .  Hypertension   . Irregular heart beat   . Obesity   . Post-operative nausea and vomiting     Past Surgical History:  Procedure Laterality Date  . athroscopy of rt knee  02/02  . henia - umbilical surgery  0/38/33  . HERNIA REPAIR  05/15/2017   double hernia surgery  . HIP SURGERY  11/2014   right hip  . MOUTH SURGERY    . VASECTOMY      Social History   Socioeconomic History  . Marital status: Married    Spouse name: Not on file  . Number of children: Not on file  . Years of education: Not on file  . Highest education level: Not on file  Occupational History  . Not on file  Social Needs  . Financial resource strain: Not on file  . Food insecurity    Worry: Not on file     Inability: Not on file  . Transportation needs    Medical: Not on file    Non-medical: Not on file  Tobacco Use  . Smoking status: Never Smoker  . Smokeless tobacco: Never Used  Substance and Sexual Activity  . Alcohol use: No    Alcohol/week: 0.0 standard drinks  . Drug use: No  . Sexual activity: Not on file  Lifestyle  . Physical activity    Days per week: Not on file    Minutes per session: Not on file  . Stress: Not on file  Relationships  . Social Herbalist on phone: Not on file    Gets together: Not on file    Attends religious service: Not on file    Active member of club or organization: Not on file    Attends meetings of clubs or organizations: Not on file    Relationship status: Not on file  . Intimate partner violence    Fear of current or ex partner: Not on file    Emotionally abused: Not on file    Physically abused: Not on file    Forced sexual activity: Not on file  Other Topics Concern  . Not on file  Social History Narrative  . Not on file    Outpatient Encounter Medications as of 02/11/2019  Medication Sig  . albuterol (VENTOLIN HFA) 108 (90 Base) MCG/ACT inhaler Inhale 2 puffs into the lungs every 6 (six) hours as needed for wheezing or shortness of breath.  [name] aspirin 81 MG chewable tablet Chew 81 mg by mouth daily.    . cholecalciferol (VITAMIN D) 1000 UNITS tablet Take 1,000 Units by mouth daily. 3 tablets daily  . Cinnamon 500 MG capsule Take 1,000 mg by mouth daily.   . diclofenac (VOLTAREN) 75 MG EC tablet Take 1 tablet by mouth twice daily  . fluticasone (FLONASE) 50 MCG/ACT nasal spray Place 2 sprays into both nostrils daily.  . Multiple Vitamin (MULTIVITAMIN) tablet Take 1 tablet by mouth daily.   . Omega-3 Fatty Acids (FISH OIL) 1000 MG CAPS Take 1 capsule by mouth daily.   [name] omeprazole (PRILOSEC) 20 MG capsule Take 1 capsule (20 mg total) by mouth 2 (two) times daily before a meal.  . PARoxetine (PAXIL-CR) 25 MG 24 hr tablet Take  1 tablet (25 mg total) by mouth daily.  . pravastatin (PRAVACHOL) 40 MG tablet Take 1 tablet (40 mg total) by mouth at bedtime.  [name] telmisartan (MICARDIS) 40 MG tablet Take 1 tablet (40 mg total) by mouth daily.  [name] triamterene-hydrochlorothiazide (MAXZIDE-25)  37.5-25 MG tablet Take 1 tablet by mouth daily.  . TURMERIC PO Take 1 capsule by mouth daily.   . [DISCONTINUED] albuterol (PROVENTIL HFA;VENTOLIN HFA) 108 (90 Base) MCG/ACT inhaler Inhale 2 puffs into the lungs every 6 (six) hours as needed for wheezing or shortness of breath.  . predniSONE (DELTASONE) 20 MG tablet 2 po at sametime daily for 5 days  . sildenafil (REVATIO) 20 MG tablet Take 2-5 tabs (Patient not taking: Reported on 02/11/2019)  . [DISCONTINUED] fluticasone (FLONASE) 50 MCG/ACT nasal spray Place 1 spray into both nostrils as needed for allergies or rhinitis.   No facility-administered encounter medications on file as of 02/11/2019.     Allergies  Allergen Reactions  . Other Anaphylaxis    Make me hurt alot  . Pravastatin Other (See Comments)    Stiff neck  . Codeine     Unknown   . Lovastatin     Makes me hurt alot  . Niaspan [Niacin Er]     Make me hurt alot    Review of Systems  Constitutional: Negative for activity change, appetite change, chills, diaphoresis, fatigue, fever and unexpected weight change.  HENT: Negative.   Eyes: Negative.  Negative for photophobia and visual disturbance.  Respiratory: Positive for cough and wheezing. Negative for apnea, choking, chest tightness, shortness of breath and stridor.   Cardiovascular: Negative for chest pain, palpitations and leg swelling.  Gastrointestinal: Negative for abdominal distention, abdominal pain, anal bleeding, blood in stool, constipation, diarrhea, nausea, rectal pain and vomiting.  Endocrine: Negative.  Negative for cold intolerance, heat intolerance, polydipsia, polyphagia and polyuria.  Genitourinary: Negative for decreased urine volume, difficulty  urinating, dysuria, enuresis, frequency and urgency.  Musculoskeletal: Negative for arthralgias, back pain, gait problem, joint swelling, myalgias and neck pain.  Skin: Negative.   Allergic/Immunologic: Negative.   Neurological: Negative for dizziness, tremors, seizures, syncope, facial asymmetry, speech difficulty, weakness, light-headedness, numbness and headaches.  Hematological: Negative.  Does not bruise/bleed easily.  Psychiatric/Behavioral: Negative for confusion, hallucinations, sleep disturbance and suicidal ideas.  All other systems reviewed and are negative.       Objective:  BP 112/69   Pulse 65   Temp (!) 97.3 F (36.3 C)   Resp 20   Ht _0  (1.778 m)   Wt 257 lb (116.6 kg)   SpO2 98%   BMI 36.88 kg/m    Wt Readings from Last 3 Encounters:  02/11/19 257 lb (116.6 kg)  11/19/18 254 lb 6.4 oz (115.4 kg)  11/12/18 255 lb 6.4 oz (115.8 kg)    Physical Exam Vitals signs and nursing note reviewed.  Constitutional:      General: He is not in acute distress.    Appearance: Normal appearance. He is well-developed and well-groomed. He is morbidly obese. He is not ill-appearing, toxic-appearing or diaphoretic.  HENT:     Head: Normocephalic and atraumatic.     Jaw: There is normal jaw occlusion.     Right Ear: Hearing, tympanic membrane, ear canal and external ear normal.     Left Ear: Hearing, tympanic membrane, ear canal and external ear normal.     Nose: Nose normal.     Mouth/Throat:     Lips: Pink.     Mouth: Mucous membranes are moist.     Pharynx: Oropharynx is clear. Uvula midline.  Eyes:     General: Lids are normal.     Extraocular Movements: Extraocular movements intact.     Conjunctiva/sclera: Conjunctivae normal.  Pupils: Pupils are equal, round, and reactive to light.  Neck:     Musculoskeletal: Normal range of motion and neck supple.     Thyroid: No thyroid mass, thyromegaly or thyroid tenderness.     Vascular: No carotid bruit or JVD.      Trachea: Trachea and phonation normal.  Cardiovascular:     Rate and Rhythm: Normal rate and regular rhythm.     Chest Wall: PMI is not displaced.     Pulses: Normal pulses.     Heart sounds: Normal heart sounds. No murmur. No friction rub. No gallop.   Pulmonary:     Effort: Pulmonary effort is normal. No respiratory distress.     Breath sounds: Examination of the right-lower field reveals wheezing. Examination of the left-lower field reveals wheezing. Wheezing (minimal, expiratory) present.  Abdominal:     General: Abdomen is protuberant. Bowel sounds are normal. There is no distension or abdominal bruit.     Palpations: Abdomen is soft. There is no hepatomegaly or splenomegaly.     Tenderness: There is no abdominal tenderness. There is no right CVA tenderness or left CVA tenderness.     Hernia: No hernia is present.  Musculoskeletal: Normal range of motion.     Right lower leg: No edema.     Left lower leg: No edema.  Lymphadenopathy:     Cervical: No cervical adenopathy.  Skin:    General: Skin is warm and dry.     Capillary Refill: Capillary refill takes less than 2 seconds.     Coloration: Skin is not cyanotic, jaundiced or pale.     Findings: No rash.  Neurological:     General: No focal deficit present.     Mental Status: He is alert and oriented to person, place, and time.     Cranial Nerves: Cranial nerves are intact. No cranial nerve deficit.     Sensory: Sensation is intact. No sensory deficit.     Motor: Motor function is intact. No weakness.     Coordination: Coordination is intact. Coordination normal.     Gait: Gait is intact. Gait normal.     Deep Tendon Reflexes: Reflexes are normal and symmetric. Reflexes normal.  Psychiatric:        Attention and Perception: Attention and perception normal.        Mood and Affect: Mood and affect normal.        Speech: Speech normal.        Behavior: Behavior normal. Behavior is cooperative.        Thought Content: Thought  content normal.        Cognition and Memory: Cognition and memory normal.        Judgment: Judgment normal.     Results for orders placed or performed in visit on 80/16/55  Basic Metabolic Panel (BMET)  Result Value Ref Range   Glucose 107 (H) 65 - 99 mg/dL   BUN 22 8 - 27 mg/dL   Creatinine, Ser 0.91 0.76 - 1.27 mg/dL   GFR calc non Af Amer 91 >59 mL/min/1.73   GFR calc Af Amer 106 >59 mL/min/1.73   BUN/Creatinine Ratio 24 10 - 24   Sodium 137 134 - 144 mmol/L   Potassium 4.1 3.5 - 5.2 mmol/L   Chloride 98 96 - 106 mmol/L   CO2 25 20 - 29 mmol/L   Calcium 8.9 8.6 - 10.2 mg/dL       Pertinent labs & imaging results that were available  during my care of the patient were reviewed by me and considered in my medical decision making.  Assessment & Plan:  Hamsa was seen today for medical management of chronic issues, hypertension and hyperlipidemia.  Diagnoses and all orders for this visit:  Pure hypercholesterolemia Diet encouraged - increase intake of fresh fruits and vegetables, increase intake of lean proteins. Bake, broil, or grill foods. Avoid fried, greasy, and fatty foods. Avoid fast foods. Increase intake of fiber-rich whole grains. Exercise encouraged - at least 150 minutes per week and advance as tolerated.  Goal BMI < 25. Continue medications as prescribed. Follow up in 3-6 months as discussed.  -     CMP14+EGFR -     Lipid panel  Essential hypertension BP well controlled. Changes were not made in regimen today. Daily blood pressure log given with instructions on how to fill out and told to bring to next visit. Goal BP 130/80. Pt aware to report any persistent high or low readings. DASH diet and exercise encouraged. Exercise at least 150 minutes per week and increase as tolerated. Goal BMI > 25. Stress management encouraged. Smoking cessation discussed. Avoid excessive alcohol. Avoid NSAID's. Avoid more than 2000 mg of sodium daily. Medications as prescribed. Follow up as  scheduled.  -     CBC with Differential/Platelet -     Thyroid Panel With TSH  Vitamin D deficiency Labs pending. Continue repletion therapy. If indicated, will change repletion dosage. Eat foods rich in Vit D including milk, orange juice, yogurt with vitamin D added, salmon or mackerel, canned tuna fish, cereals with vitamin D added, and cod liver oil. Get out in the sun but make sure to wear at least SPF 30 sunscreen.  -     CBC with Differential/Platelet -     VITAMIN D 25 Hydroxy (Vit-D Deficiency, Fractures)  Bronchospasm Will refill albuterol. Pt aware to use at least 10-15 minutes prior to exertion or exercise. Will give burst of steroids. Report any new, worsening, or persistent symptoms.  -     albuterol (VENTOLIN HFA) 108 (90 Base) MCG/ACT inhaler; Inhale 2 puffs into the lungs every 6 (six) hours as needed for wheezing or shortness of breath. -     predniSONE (DELTASONE) 20 MG tablet; 2 po at sametime daily for 5 days  Morbid obesity ([place]) Diet and exercise encouraged. Labs pending.  -     CMP14+EGFR -     CBC with Differential/Platelet -     Lipid panel -     Thyroid Panel With TSH  Need for immunization against influenza -     Flu Vaccine QUAD 36+ mos IM     Continue all other maintenance medications.  Follow up plan: Return in about 6 months (around 08/12/2019), or if symptoms worsen or fail to improve, for HTN, Lipids.  Continue healthy lifestyle choices, including diet (rich in fruits, vegetables, and lean proteins, and low in salt and simple carbohydrates) and exercise (at least 30 minutes of moderate physical activity daily).  Educational handout given for survey, COVID-19  The above assessment and management plan was discussed with the patient. The patient verbalized understanding of and has agreed to the management plan. Patient is aware to call the clinic if they develop any new symptoms or if symptoms persist or worsen. Patient is aware when to return to the  clinic for a follow-up visit. Patient educated on when it is appropriate to go to the emergency department.   Monia Pouch, FNP-C [facility] [phone]

## 2019-02-11 NOTE — Patient Instructions (Signed)
It was a pleasure seeing you today, Jacob Cline.  Information regarding what we discussed is included in this packet.  Please make an appointment to see me in 4-6 months.   In a few days you may receive a survey in the mail or online from [employer] regarding your visit with [place] today. Please take a moment to fill this out. Your feedback is very important to our office. It can help [place] better understand your needs as well as improve your experience and satisfaction. Thank you for taking your time to complete it. We care about you.  Because of recent events of COVID-19 ("Coronavirus"), please follow CDC recommendations:   1. Wash your hand frequently 2. Avoid touching your face 3. Stay away from people who are sick 4. If you have symptoms such as fever, cough, shortness of breath then call your healthcare provider for further guidance 5. If you are sick, STAY AT HOME, unless otherwise directed by your healthcare provider. 6. Follow directions from state and national officials regarding staying safe    Please feel free to call our office if any questions or concerns arise.  Warm Regards, Monia Pouch, FNP-C [facility] [address] [place], [place] [address] [phone]

## 2019-02-12 LAB — CMP14+EGFR
ALT: 38 IU/L (ref 0–44)
AST: 24 IU/L (ref 0–40)
Albumin/Globulin Ratio: 1.7 (ref 1.2–2.2)
Albumin: 4.1 g/dL (ref 3.8–4.9)
Alkaline Phosphatase: 82 IU/L (ref 39–117)
BUN/Creatinine Ratio: 32 — ABNORMAL HIGH (ref 10–24)
BUN: 30 mg/dL — ABNORMAL HIGH (ref 8–27)
Bilirubin Total: 0.3 mg/dL (ref 0.0–1.2)
CO2: 26 mmol/L (ref 20–29)
Calcium: 9 mg/dL (ref 8.6–10.2)
Chloride: 100 mmol/L (ref 96–106)
Creatinine, Ser: 0.93 mg/dL (ref 0.76–1.27)
GFR calc Af Amer: 103 mL/min/{1.73_m2} (ref >59)
GFR calc non Af Amer: 89 mL/min/{1.73_m2} (ref >59)
Globulin, Total: 2.4 g/dL (ref 1.5–4.5)
Glucose: 106 mg/dL — ABNORMAL HIGH (ref 65–99)
Potassium: 4.4 mmol/L (ref 3.5–5.2)
Sodium: 140 mmol/L (ref 134–144)
Total Protein: 6.5 g/dL (ref 6.0–8.5)

## 2019-02-12 LAB — CBC WITH DIFFERENTIAL/PLATELET
Basophils Absolute: 0 10*3/uL (ref 0.0–0.2)
Basos: 1 %
EOS (ABSOLUTE): 0.4 10*3/uL (ref 0.0–0.4)
Eos: 5 %
Hematocrit: 41.1 % (ref 37.5–51.0)
Hemoglobin: 13.9 g/dL (ref 13.0–17.7)
Immature Grans (Abs): 0 10*3/uL (ref 0.0–0.1)
Immature Granulocytes: 0 %
Lymphocytes Absolute: 1.5 10*3/uL (ref 0.7–3.1)
Lymphs: 20 %
MCH: 31.3 pg (ref 26.6–33.0)
MCHC: 33.8 g/dL (ref 31.5–35.7)
MCV: 93 fL (ref 79–97)
Monocytes Absolute: 0.6 10*3/uL (ref 0.1–0.9)
Monocytes: 9 %
Neutrophils Absolute: 4.8 10*3/uL (ref 1.4–7.0)
Neutrophils: 65 %
Platelets: 178 10*3/uL (ref 150–450)
RBC: 4.44 x10E6/uL (ref 4.14–5.80)
RDW: 13.1 % (ref 11.6–15.4)
WBC: 7.3 10*3/uL (ref 3.4–10.8)

## 2019-02-12 LAB — LIPID PANEL
Chol/HDL Ratio: 3.6 ratio (ref 0.0–5.0)
Cholesterol, Total: 143 mg/dL (ref 100–199)
HDL: 40 mg/dL (ref >39)
LDL Chol Calc (NIH): 80 mg/dL (ref 0–99)
Triglycerides: 127 mg/dL (ref 0–149)
VLDL Cholesterol Cal: 23 mg/dL (ref 5–40)

## 2019-02-12 LAB — THYROID PANEL WITH TSH
Free Thyroxine Index: 1.5 (ref 1.2–4.9)
T3 Uptake Ratio: 26 % (ref 24–39)
T4, Total: 5.6 ug/dL (ref 4.5–12.0)
TSH: 1.34 u[IU]/mL (ref 0.450–4.500)

## 2019-02-12 LAB — VITAMIN D 25 HYDROXY (VIT D DEFICIENCY, FRACTURES): Vit D, 25-Hydroxy: 31.6 ng/mL (ref 30.0–100.0)

## 2019-02-22 ENCOUNTER — Other Ambulatory Visit: Payer: Self-pay | Admitting: Family Medicine

## 2019-03-11 ENCOUNTER — Ambulatory Visit: Payer: Managed Care, Other (non HMO) | Admitting: Family Medicine

## 2019-03-22 ENCOUNTER — Other Ambulatory Visit: Payer: Self-pay | Admitting: Family Medicine

## 2019-03-22 NOTE — Telephone Encounter (Signed)
COVID is a virus and dose not require antibiotics.

## 2019-03-22 NOTE — Telephone Encounter (Signed)
Pt aware of provider feedback and voiced understanding. 

## 2019-03-28 ENCOUNTER — Telehealth: Payer: Self-pay | Admitting: Family Medicine

## 2019-03-28 NOTE — Telephone Encounter (Signed)
Patient was diagnosed with Covid last Friday.  He has not felt bad until yesterday when he began having fever and headache.  Temperature ranging around 100.4, headache, oxygen level 95%, blood pressure normal.  Patient calling to see if there is anything in particular he should watch for.  Advised patient to monitor his oxygen level, any shortness of breath or difficulty breathing or dip in oxygen he should go to the emergency room.  Patient voiced understanding.

## 2019-03-30 DIAGNOSIS — J45909 Unspecified asthma, uncomplicated: Secondary | ICD-10-CM | POA: Insufficient documentation

## 2019-04-04 MED ORDER — PANTOPRAZOLE SODIUM 40 MG PO TBEC
40.00 | DELAYED_RELEASE_TABLET | ORAL | Status: DC
Start: 2019-04-04 — End: 2019-04-04

## 2019-04-04 MED ORDER — LOSARTAN POTASSIUM 50 MG PO TABS
50.00 | ORAL_TABLET | ORAL | Status: DC
Start: 2019-04-04 — End: 2019-04-04

## 2019-04-04 MED ORDER — FISH OIL 1000 MG PO CAPS
1.00 | ORAL_CAPSULE | ORAL | Status: DC
Start: 2019-04-04 — End: 2019-04-04

## 2019-04-04 MED ORDER — SALINE NASAL SPRAY 0.65 % NA SOLN
1.00 | NASAL | Status: DC
Start: ? — End: 2019-04-04

## 2019-04-04 MED ORDER — GENERIC EXTERNAL MEDICATION
Status: DC
Start: ? — End: 2019-04-04

## 2019-04-04 MED ORDER — LOPERAMIDE HCL 2 MG PO CAPS
2.00 | ORAL_CAPSULE | ORAL | Status: DC
Start: ? — End: 2019-04-04

## 2019-04-04 MED ORDER — SODIUM CHLORIDE 0.9 % IV SOLN
10.00 | INTRAVENOUS | Status: DC
Start: ? — End: 2019-04-04

## 2019-04-04 MED ORDER — HYDROCODONE-ACETAMINOPHEN 5-325 MG PO TABS
1.00 | ORAL_TABLET | ORAL | Status: DC
Start: ? — End: 2019-04-04

## 2019-04-04 MED ORDER — INSULIN LISPRO 100 UNIT/ML ~~LOC~~ SOLN
2.00 | SUBCUTANEOUS | Status: DC
Start: 2019-04-03 — End: 2019-04-04

## 2019-04-04 MED ORDER — ALBUTEROL SULFATE HFA 108 (90 BASE) MCG/ACT IN AERS
2.00 | INHALATION_SPRAY | RESPIRATORY_TRACT | Status: DC
Start: ? — End: 2019-04-04

## 2019-04-04 MED ORDER — ENOXAPARIN SODIUM 60 MG/0.6ML ~~LOC~~ SOLN
50.00 | SUBCUTANEOUS | Status: DC
Start: 2019-04-03 — End: 2019-04-04

## 2019-04-04 MED ORDER — PAROXETINE HCL 20 MG PO TABS
20.00 | ORAL_TABLET | ORAL | Status: DC
Start: 2019-04-04 — End: 2019-04-04

## 2019-04-04 MED ORDER — DEXAMETHASONE 4 MG PO TABS
6.00 | ORAL_TABLET | ORAL | Status: DC
Start: 2019-04-04 — End: 2019-04-04

## 2019-04-04 MED ORDER — FLUTICASONE PROPIONATE 50 MCG/ACT NA SUSP
2.00 | NASAL | Status: DC
Start: 2019-04-03 — End: 2019-04-04

## 2019-04-04 MED ORDER — DICLOFENAC SODIUM 75 MG PO TBEC
75.00 | DELAYED_RELEASE_TABLET | ORAL | Status: DC
Start: ? — End: 2019-04-04

## 2019-04-04 MED ORDER — ACETAMINOPHEN 325 MG PO TABS
650.00 | ORAL_TABLET | ORAL | Status: DC
Start: ? — End: 2019-04-04

## 2019-04-04 MED ORDER — VITAMIN D3 25 MCG (1000 UNIT) PO TABS
1000.00 | ORAL_TABLET | ORAL | Status: DC
Start: 2019-04-04 — End: 2019-04-04

## 2019-04-04 MED ORDER — MUPIROCIN 2 % EX OINT
TOPICAL_OINTMENT | CUTANEOUS | Status: DC
Start: 2019-04-03 — End: 2019-04-04

## 2019-04-04 MED ORDER — CLONIDINE HCL 0.1 MG PO TABS
0.10 | ORAL_TABLET | ORAL | Status: DC
Start: ? — End: 2019-04-04

## 2019-04-10 ENCOUNTER — Encounter: Payer: Self-pay | Admitting: Family Medicine

## 2019-04-10 ENCOUNTER — Ambulatory Visit (INDEPENDENT_AMBULATORY_CARE_PROVIDER_SITE_OTHER): Payer: Managed Care, Other (non HMO) | Admitting: Family Medicine

## 2019-04-10 DIAGNOSIS — J9801 Acute bronchospasm: Secondary | ICD-10-CM | POA: Diagnosis not present

## 2019-04-10 DIAGNOSIS — R059 Cough, unspecified: Secondary | ICD-10-CM

## 2019-04-10 DIAGNOSIS — R05 Cough: Secondary | ICD-10-CM

## 2019-04-10 DIAGNOSIS — R0602 Shortness of breath: Secondary | ICD-10-CM | POA: Diagnosis not present

## 2019-04-10 MED ORDER — BUDESONIDE-FORMOTEROL FUMARATE 80-4.5 MCG/ACT IN AERO: 2.0000 | INHALATION_SPRAY | Freq: Two times a day (BID) | RESPIRATORY_TRACT | 3 refills | Status: DC

## 2019-04-10 MED ORDER — GUAIFENESIN ER 600 MG PO TB12: 1200.0000 mg | ORAL_TABLET | Freq: Two times a day (BID) | ORAL | 0 refills | Status: AC

## 2019-04-10 NOTE — Progress Notes (Signed)
Virtual Visit via telephone Note Due to COVID-19 pandemic this visit was conducted virtually. This visit type was conducted due to national recommendations for restrictions regarding the COVID-19 Pandemic (e.g. social distancing, sheltering in place) in an effort to limit this patient's exposure and mitigate transmission in our community. All issues noted in this document were discussed and addressed.  A physical exam was not performed with this format.   I connected with Jacob Cline on 04/10/2019 at 1355 by telephone and verified that I am speaking with the correct person using two identifiers. Jacob Cline is currently located at home and family is currently with them during visit. The provider, Kari Baars, FNP is located in their office at time of visit.  I discussed the limitations, risks, security and privacy concerns of performing an evaluation and management service by telephone and the availability of in person appointments. I also discussed with the patient that there may be a patient responsible charge related to this service. The patient expressed understanding and agreed to proceed.  Subjective:  Patient ID: Jacob Cline, male    DOB: 1960/05/29, 58 y.o.   MRN: [id number]  Chief Complaint:  Shortness of Breath and Cough   HPI: Jacob Cline is a 58 y.o. male presenting on 04/10/2019 for Shortness of Breath and Cough   Pt is recovering from COVID-19 and pneumonia related to COVID-19. Pt states he continues to have exertional shortness of breath and slight cough. No fever, chills, weakness, or fatigue. Has been eating and drinking well. No other associated symptoms. Has been using Albuterol inhaler with some relief of symptoms.   Shortness of Breath This is a recurrent problem. The current episode started 1 to 4 weeks ago. The problem occurs daily. The problem has been waxing and waning. Pertinent negatives include no abdominal pain, chest pain, claudication, coryza, ear  pain, fever, headaches, hemoptysis, leg pain, leg swelling, neck pain, orthopnea, PND, rash, rhinorrhea, sore throat, sputum production, swollen glands, syncope, vomiting or wheezing. The symptoms are aggravated by any activity and exercise. He has tried oral steroids and beta agonist inhalers for the symptoms. The treatment provided moderate relief.  Cough Associated symptoms include shortness of breath. Pertinent negatives include no chest pain, chills, ear pain, fever, headaches, hemoptysis, myalgias, rash, rhinorrhea, sore throat or wheezing.     Relevant past medical, surgical, family, and social history reviewed and updated as indicated.  Allergies and medications reviewed and updated.   Past Medical History:  Diagnosis Date   BPH (benign prostatic hypertrophy)    Hemorrhoids    Hiatal hernia    Hyperlipidemia    Hypertension    Irregular heart beat    Obesity    Post-operative nausea and vomiting     Past Surgical History:  Procedure Laterality Date   athroscopy of rt knee  02/02   henia - umbilical surgery  07/17/07   HERNIA REPAIR  05/15/2017   double hernia surgery   HIP SURGERY  11/2014   right hip   MOUTH SURGERY     VASECTOMY      Social History   Socioeconomic History   Marital status: Married    Spouse name: Not on file   Number of children: Not on file   Years of education: Not on file   Highest education level: Not on file  Occupational History   Not on file  Social Needs   Financial resource strain: Not on file   Food insecurity  Worry: Not on file    Inability: Not on file   Transportation needs    Medical: Not on file    Non-medical: Not on file  Tobacco Use   Smoking status: Never Smoker   Smokeless tobacco: Never Used  Substance and Sexual Activity   Alcohol use: No    Alcohol/week: 0.0 standard drinks   Drug use: No   Sexual activity: Not on file  Lifestyle   Physical activity    Days per week: Not on  file    Minutes per session: Not on file   Stress: Not on file  Relationships   Social connections    Talks on phone: Not on file    Gets together: Not on file    Attends religious service: Not on file    Active member of club or organization: Not on file    Attends meetings of clubs or organizations: Not on file    Relationship status: Not on file   Intimate partner violence    Fear of current or ex partner: Not on file    Emotionally abused: Not on file    Physically abused: Not on file    Forced sexual activity: Not on file  Other Topics Concern   Not on file  Social History Narrative   Not on file    Outpatient Encounter Medications as of 04/10/2019  Medication Sig   acetaminophen (TYLENOL) 500 MG tablet Take by mouth.   albuterol (VENTOLIN HFA) 108 (90 Base) MCG/ACT inhaler Inhale 2 puffs into the lungs every 6 (six) hours as needed for wheezing or shortness of breath.   aspirin 81 MG chewable tablet Chew 81 mg by mouth daily.     budesonide-formoterol (SYMBICORT) 80-4.5 MCG/ACT inhaler Inhale 2 puffs into the lungs 2 (two) times daily.   cholecalciferol (VITAMIN D) 1000 UNITS tablet Take 1,000 Units by mouth daily. 3 tablets daily   Cinnamon 500 MG capsule Take 1,000 mg by mouth daily.    diclofenac (VOLTAREN) 75 MG EC tablet Take 1 tablet by mouth twice daily   fluticasone (FLONASE) 50 MCG/ACT nasal spray Place 2 sprays into both nostrils daily.   Multiple Vitamin (MULTIVITAMIN) tablet Take 1 tablet by mouth daily.    Omega-3 Fatty Acids (FISH OIL) 1000 MG CAPS Take 1 capsule by mouth daily.    omeprazole (PRILOSEC) 20 MG capsule Take 1 capsule (20 mg total) by mouth 2 (two) times daily before a meal.   PARoxetine (PAXIL-CR) 25 MG 24 hr tablet Take 1 tablet (25 mg total) by mouth daily.   pravastatin (PRAVACHOL) 40 MG tablet Take 1 tablet (40 mg total) by mouth at bedtime.   sildenafil (REVATIO) 20 MG tablet Take 2-5 tabs (Patient not taking: Reported on  02/11/2019)   telmisartan (MICARDIS) 40 MG tablet Take 1 tablet (40 mg total) by mouth daily.   triamterene-hydrochlorothiazide (MAXZIDE-25) 37.5-25 MG tablet Take 1 tablet by mouth daily.   TURMERIC PO Take 1 capsule by mouth daily.    [DISCONTINUED] predniSONE (DELTASONE) 20 MG tablet 2 po at sametime daily for 5 days   [DISCONTINUED] TURMERIC PO Take by mouth.   No facility-administered encounter medications on file as of 04/10/2019.     Allergies  Allergen Reactions   Other Anaphylaxis    Make me hurt alot   Pravastatin Other (See Comments)    Stiff neck   Codeine     Unknown    Lovastatin     Makes me hurt alot  Niaspan [Niacin Er]     Make me hurt alot    Review of Systems  Constitutional: Negative for activity change, appetite change, chills, diaphoresis, fatigue, fever and unexpected weight change.  HENT: Negative.  Negative for congestion, ear pain, rhinorrhea and sore throat.   Eyes: Negative.   Respiratory: Positive for cough and shortness of breath. Negative for hemoptysis, sputum production, chest tightness and wheezing.   Cardiovascular: Negative for chest pain, palpitations, orthopnea, claudication, leg swelling, syncope and PND.  Gastrointestinal: Negative for abdominal pain, blood in stool, constipation, diarrhea, nausea and vomiting.  Endocrine: Negative.   Genitourinary: Negative for decreased urine volume, difficulty urinating, dysuria, frequency and urgency.  Musculoskeletal: Negative for arthralgias, myalgias and neck pain.  Skin: Negative.  Negative for rash.  Allergic/Immunologic: Negative.   Neurological: Negative for dizziness, tremors, syncope, facial asymmetry, speech difficulty, weakness, light-headedness, numbness and headaches.  Hematological: Negative.   Psychiatric/Behavioral: Negative for confusion, hallucinations, sleep disturbance and suicidal ideas.  All other systems reviewed and are negative.         Observations/Objective: No vital signs or physical exam, this was a telephone or virtual health encounter.  Pt alert and oriented, answers all questions appropriately, and able to speak in full sentences.    Assessment and Plan: Jacob Cline was seen today for shortness of breath and cough.  Diagnoses and all orders for this visit:  Bronchospasm Shortness of breath Cough Residual cough and SHOB since COVID-19 infection. Initiate Mucinex and Symbicort to see if beneficial. Report any new, worsening, or persistent symptoms. Follow up in 2 weeks if not improving.  -     budesonide-formoterol (SYMBICORT) 80-4.5 MCG/ACT inhaler; Inhale 2 puffs into the lungs 2 (two) times daily.      Follow Up Instructions: Return if symptoms worsen or fail to improve.    I discussed the assessment and treatment plan with the patient. The patient was provided an opportunity to ask questions and all were answered. The patient agreed with the plan and demonstrated an understanding of the instructions.   The patient was advised to call back or seek an in-person evaluation if the symptoms worsen or if the condition fails to improve as anticipated.  The above assessment and management plan was discussed with the patient. The patient verbalized understanding of and has agreed to the management plan. Patient is aware to call the clinic if they develop any new symptoms or if symptoms persist or worsen. Patient is aware when to return to the clinic for a follow-up visit. Patient educated on when it is appropriate to go to the emergency department.    I provided 15 minutes of non-face-to-face time during this encounter. The call started at 1355. The call ended at 1410. The other time was used for coordination of care.    Kari BaarsMichelle Takeria Marquina, FNP-C [facility] [address] [place], [place] [id number] [phone] 04/10/2019

## 2019-04-15 ENCOUNTER — Telehealth: Payer: Self-pay

## 2019-04-15 NOTE — Telephone Encounter (Signed)
Pt aware of provider feedback and voiced understanding. 

## 2019-04-15 NOTE — Telephone Encounter (Signed)
Patient had a telephone visit with you last week for a hospital follow up from Covid. He started Symbicort on Friday and since has had a terrible HA. His BPs have been elevated. This AM was 153/93. No chest pain, arm pain, or SOB. He is having some swelling in his lower legs. Wants to know if he should continue Symbicort. Please advise

## 2019-04-15 NOTE — Telephone Encounter (Signed)
He can discontinue and see if symptoms go away, if not, he needs to be seen.

## 2019-04-25 ENCOUNTER — Telehealth: Payer: Self-pay | Admitting: Family Medicine

## 2019-04-25 NOTE — Telephone Encounter (Signed)
If he is worsening, he needs to be seen in UC or ED.

## 2019-04-26 NOTE — Telephone Encounter (Signed)
Televisit made  

## 2019-05-06 ENCOUNTER — Ambulatory Visit (INDEPENDENT_AMBULATORY_CARE_PROVIDER_SITE_OTHER): Payer: Managed Care, Other (non HMO) | Admitting: Family Medicine

## 2019-05-06 DIAGNOSIS — Z5329 Procedure and treatment not carried out because of patient's decision for other reasons: Secondary | ICD-10-CM

## 2019-05-06 DIAGNOSIS — Z91199 Patient's noncompliance with other medical treatment and regimen due to unspecified reason: Secondary | ICD-10-CM

## 2019-05-06 NOTE — Progress Notes (Signed)
Attempted to call at 1445, no answer. Attempted to call at 1500, no answer.  Attempted to call at 1515, no answer.  Attempted to call at 1520, no answer.

## 2019-05-07 ENCOUNTER — Encounter: Payer: Self-pay | Admitting: Family Medicine

## 2019-05-07 ENCOUNTER — Ambulatory Visit (INDEPENDENT_AMBULATORY_CARE_PROVIDER_SITE_OTHER): Payer: Managed Care, Other (non HMO) | Admitting: Family Medicine

## 2019-05-07 DIAGNOSIS — R0902 Hypoxemia: Secondary | ICD-10-CM

## 2019-05-07 DIAGNOSIS — Z7189 Other specified counseling: Secondary | ICD-10-CM

## 2019-05-07 MED ORDER — TRIAMTERENE-HCTZ 37.5-25 MG PO TABS: 0.5000 | ORAL_TABLET | Freq: Every day | ORAL | 3 refills | Status: DC

## 2019-05-07 NOTE — Progress Notes (Signed)
Telephone visit  Subjective: CC: COVID PCP: Sonny Masters, FNP LFY:BOFBP Jacob Cline is a 58 y.o. male calls for telephone consult today. Patient provides verbal consent for consult held via phone.  Due to COVID-19 pandemic this visit was conducted virtually. This visit type was conducted due to national recommendations for restrictions regarding the COVID-19 Pandemic (e.g. social distancing, sheltering in place) in an effort to limit this patient's exposure and mitigate transmission in our community. All issues noted in this document were discussed and addressed.  A physical exam was not performed with this format.   Location of patient: home Location of provider: [facility] Others present for call: none  1. COVID19 Patient reports that he did not tolerate Symbicort.  He continues to use Albuterol.  He notes he has returned to work and is doing ok.  He voices concerns about O2 level, which has been as low as low 80s.  Mostly it runs around 88-94%.  Sometimes he gets short winded with physical exertion but otherwise does ok with activity.  He is using albuterol 3-4 times daily that helps.  ROS: Per HPI  Allergies  Allergen Reactions  . Other Anaphylaxis    Make me hurt alot  . Pravastatin Other (See Comments)    Stiff neck  . Codeine     Unknown   . Lovastatin     Makes me hurt alot  . Niaspan [Niacin Er]     Make me hurt alot   Past Medical History:  Diagnosis Date  . BPH (benign prostatic hypertrophy)   . Hemorrhoids   . Hiatal hernia   . Hyperlipidemia   . Hypertension   . Irregular heart beat   . Obesity   . Post-operative nausea and vomiting     Current Outpatient Medications:  .  acetaminophen (TYLENOL) 500 MG tablet, Take by mouth., Disp: , Rfl:  .  albuterol (VENTOLIN HFA) 108 (90 Base) MCG/ACT inhaler, Inhale 2 puffs into the lungs every 6 (six) hours as needed for wheezing or shortness of breath., Disp: 18 g, Rfl: 11 .  aspirin 81 MG chewable tablet, Chew 81 mg by  mouth daily.  , Disp: , Rfl:  .  budesonide-formoterol (SYMBICORT) 80-4.5 MCG/ACT inhaler, Inhale 2 puffs into the lungs 2 (two) times daily., Disp: 1 Inhaler, Rfl: 3 .  cholecalciferol (VITAMIN D) 1000 UNITS tablet, Take 1,000 Units by mouth daily. 3 tablets daily, Disp: , Rfl:  .  Cinnamon 500 MG capsule, Take 1,000 mg by mouth daily. , Disp: , Rfl:  .  diclofenac (VOLTAREN) 75 MG EC tablet, Take 1 tablet by mouth twice daily, Disp: 60 tablet, Rfl: 0 .  fluticasone (FLONASE) 50 MCG/ACT nasal spray, Place 2 sprays into both nostrils daily., Disp: 16 g, Rfl: 11 .  guaiFENesin (MUCINEX) 600 MG 12 hr tablet, Take 2 tablets (1,200 mg total) by mouth 2 (two) times daily., Disp: 120 tablet, Rfl: 0 .  Multiple Vitamin (MULTIVITAMIN) tablet, Take 1 tablet by mouth daily. , Disp: , Rfl:  .  Omega-3 Fatty Acids (FISH OIL) 1000 MG CAPS, Take 1 capsule by mouth daily. , Disp: , Rfl:  .  omeprazole (PRILOSEC) 20 MG capsule, Take 1 capsule (20 mg total) by mouth 2 (two) times daily before a meal., Disp: 180 capsule, Rfl: 3 .  PARoxetine (PAXIL-CR) 25 MG 24 hr tablet, Take 1 tablet (25 mg total) by mouth daily., Disp: 90 tablet, Rfl: 3 .  pravastatin (PRAVACHOL) 40 MG tablet, Take 1 tablet (40  mg total) by mouth at bedtime., Disp: 90 tablet, Rfl: 3 .  sildenafil (REVATIO) 20 MG tablet, Take 2-5 tabs (Patient not taking: Reported on 02/11/2019), Disp: 50 tablet, Rfl: 2 .  telmisartan (MICARDIS) 40 MG tablet, Take 1 tablet (40 mg total) by mouth daily., Disp: 90 tablet, Rfl: 3 .  triamterene-hydrochlorothiazide (MAXZIDE-25) 37.5-25 MG tablet, Take 1 tablet by mouth daily., Disp: 90 tablet, Rfl: 3 .  TURMERIC PO, Take 1 capsule by mouth daily. , Disp: , Rfl:   Assessment/ Plan: 58 y.o. male   1. Hypoxia Improving but still occasionally has O2 below 88%, rare but still occurs.  No known underlying lung disease.  Likely lung damage from COVID pneumonia.  We discussed possibility of evaluation by pulmonology vs  repeat CXR.  2. Counseled about COVID-19 virus infection  Meds ordered this encounter  Medications  . triamterene-hydrochlorothiazide (MAXZIDE-25) 37.5-25 MG tablet    Sig: Take 0.5 tablets by mouth daily.    Dispense:  90 tablet    Refill:  3    Please consider 90 day supplies to promote better adherence   Start time: 1:30pm End time: 1:43pm  Total time spent on patient care (including telephone call/ virtual visit): 16 minutes  [place], [place] [phone]

## 2019-05-28 ENCOUNTER — Ambulatory Visit (INDEPENDENT_AMBULATORY_CARE_PROVIDER_SITE_OTHER): Payer: 59 | Admitting: Family Medicine

## 2019-05-28 ENCOUNTER — Encounter: Payer: Self-pay | Admitting: Family Medicine

## 2019-05-28 DIAGNOSIS — B370 Candidal stomatitis: Secondary | ICD-10-CM

## 2019-05-28 MED ORDER — NYSTATIN 100000 UNIT/ML MT SUSP: 5.0000 mL | Freq: Four times a day (QID) | OROMUCOSAL | 0 refills | Status: DC

## 2019-05-28 NOTE — Progress Notes (Signed)
Virtual Visit via telephone Note Due to COVID-19 pandemic this visit was conducted virtually. This visit type was conducted due to national recommendations for restrictions regarding the COVID-19 Pandemic (e.g. social distancing, sheltering in place) in an effort to limit this patient's exposure and mitigate transmission in our community. All issues noted in this document were discussed and addressed.  A physical exam was not performed with this format.   I connected with Jacob Cline on 05/28/2019 at 1600 by telephone and verified that I am speaking with the correct person using two identifiers. Jacob Cline is currently located at home and family is currently with them during visit. The provider, Kari Baars, FNP is located in their office at time of visit.  I discussed the limitations, risks, security and privacy concerns of performing an evaluation and management service by telephone and the availability of in person appointments. I also discussed with the patient that there may be a patient responsible charge related to this service. The patient expressed understanding and agreed to proceed.  Subjective:  Patient ID: Jacob Cline, male    DOB: [birth year], 59 y.o.   MRN: [id number]  Chief Complaint:  Thrush   HPI: Jacob Cline is a 59 y.o. male presenting on 05/28/2019 for Thrush   Pt reports a yellowish-white coating on his tongue. Denies pain, erythema, or open areas on tongue. States he has tried rinsing it off but it returns. Denies elevated blood sugar readings. Has not been using Symbicort. No other associated symptoms.     Relevant past medical, surgical, family, and social history reviewed and updated as indicated.  Allergies and medications reviewed and updated.   Past Medical History:  Diagnosis Date  . BPH (benign prostatic hypertrophy)   . Hemorrhoids   . Hiatal hernia   . Hyperlipidemia   . Hypertension   . Irregular heart beat   . Obesity   .  Post-operative nausea and vomiting     Past Surgical History:  Procedure Laterality Date  . athroscopy of rt knee  02/02  . henia - umbilical surgery  07/17/07  . HERNIA REPAIR  05/15/2017   double hernia surgery  . HIP SURGERY  11/2014   right hip  . MOUTH SURGERY    . VASECTOMY      Social History   Socioeconomic History  . Marital status: Married    Spouse name: Not on file  . Number of children: Not on file  . Years of education: Not on file  . Highest education level: Not on file  Occupational History  . Not on file  Tobacco Use  . Smoking status: Never Smoker  . Smokeless tobacco: Never Used  Substance and Sexual Activity  . Alcohol use: No    Alcohol/week: 0.0 standard drinks  . Drug use: No  . Sexual activity: Not on file  Other Topics Concern  . Not on file  Social History Narrative  . Not on file   Social Determinants of Health   Financial Resource Strain:   . Difficulty of Paying Living Expenses: Not on file  Food Insecurity:   . Worried About Programme researcher, broadcasting/film/video in the Last Year: Not on file  . Ran Out of Food in the Last Year: Not on file  Transportation Needs:   . Lack of Transportation (Medical): Not on file  . Lack of Transportation (Non-Medical): Not on file  Physical Activity:   . Days of Exercise per Week: Not on file  .  Minutes of Exercise per Session: Not on file  Stress:   . Feeling of Stress : Not on file  Social Connections:   . Frequency of Communication with Friends and Family: Not on file  . Frequency of Social Gatherings with Friends and Family: Not on file  . Attends Religious Services: Not on file  . Active Member of Clubs or Organizations: Not on file  . Attends Banker Meetings: Not on file  . Marital Status: Not on file  Intimate Partner Violence:   . Fear of Current or Ex-Partner: Not on file  . Emotionally Abused: Not on file  . Physically Abused: Not on file  . Sexually Abused: Not on file    Outpatient  Encounter Medications as of 05/28/2019  Medication Sig  . acetaminophen (TYLENOL) 500 MG tablet Take by mouth.  [name] albuterol (VENTOLIN HFA) 108 (90 Base) MCG/ACT inhaler Inhale 2 puffs into the lungs every 6 (six) hours as needed for wheezing or shortness of breath.  [name] aspirin 81 MG chewable tablet Chew 81 mg by mouth daily.    . cholecalciferol (VITAMIN D) 1000 UNITS tablet Take 1,000 Units by mouth daily. 3 tablets daily  . Cinnamon 500 MG capsule Take 1,000 mg by mouth daily.   . diclofenac (VOLTAREN) 75 MG EC tablet Take 1 tablet by mouth twice daily  . fluticasone (FLONASE) 50 MCG/ACT nasal spray Place 2 sprays into both nostrils daily.  . Multiple Vitamin (MULTIVITAMIN) tablet Take 1 tablet by mouth daily.   [name] nystatin (MYCOSTATIN) 100000 UNIT/ML suspension Take 5 mLs (500,000 Units total) by mouth 4 (four) times daily.  . Omega-3 Fatty Acids (FISH OIL) 1000 MG CAPS Take 1 capsule by mouth daily.   [name] omeprazole (PRILOSEC) 20 MG capsule Take 1 capsule (20 mg total) by mouth 2 (two) times daily before a meal.  . PARoxetine (PAXIL-CR) 25 MG 24 hr tablet Take 1 tablet (25 mg total) by mouth daily.  . pravastatin (PRAVACHOL) 40 MG tablet Take 1 tablet (40 mg total) by mouth at bedtime.  . sildenafil (REVATIO) 20 MG tablet Take 2-5 tabs (Patient not taking: Reported on 02/11/2019)  . telmisartan (MICARDIS) 40 MG tablet Take 1 tablet (40 mg total) by mouth daily.  [name] triamterene-hydrochlorothiazide (MAXZIDE-25) 37.5-25 MG tablet Take 0.5 tablets by mouth daily.  . TURMERIC PO Take 1 capsule by mouth daily.   . [DISCONTINUED] budesonide-formoterol (SYMBICORT) 80-4.5 MCG/ACT inhaler Inhale 2 puffs into the lungs 2 (two) times daily.   No facility-administered encounter medications on file as of 05/28/2019.    Allergies  Allergen Reactions  . Other Anaphylaxis    Make me hurt alot  . Pravastatin Other (See Comments)    Stiff neck  . Codeine     Unknown   . Lovastatin     Makes me hurt alot    . Niaspan [Niacin Er]     Make me hurt alot    Review of Systems  Constitutional: Negative for activity change, appetite change, chills, diaphoresis, fatigue, fever and unexpected weight change.  HENT: Negative.  Negative for congestion, dental problem, drooling, ear discharge, ear pain, facial swelling, hearing loss, mouth sores, nosebleeds, postnasal drip, rhinorrhea, sinus pressure, sinus pain, sneezing, sore throat, tinnitus, trouble swallowing and voice change.        Coated tongue  Eyes: Negative.  Negative for photophobia and visual disturbance.  Respiratory: Positive for cough and shortness of breath (recent COVID-19). Negative for chest tightness.   Cardiovascular: Negative for  chest pain, palpitations and leg swelling.  Gastrointestinal: Negative for abdominal pain, blood in stool, constipation, diarrhea, nausea and vomiting.  Endocrine: Negative.  Negative for polydipsia, polyphagia and polyuria.  Genitourinary: Negative for difficulty urinating, dysuria, frequency and urgency.  Musculoskeletal: Negative for arthralgias and myalgias.  Skin: Negative.   Allergic/Immunologic: Negative.   Neurological: Negative for dizziness, tremors, seizures, syncope, facial asymmetry, speech difficulty, weakness, light-headedness, numbness and headaches.  Hematological: Negative.   Psychiatric/Behavioral: Negative for confusion, hallucinations, sleep disturbance and suicidal ideas.  All other systems reviewed and are negative.        Observations/Objective: No vital signs or physical exam, this was a telephone or virtual health encounter.  Pt alert and oriented, answers all questions appropriately, and able to speak in full sentences.    Assessment and Plan: Travus was seen today for thrush.  Diagnoses and all orders for this visit:  Thrush, oral Reported symptoms consistent with thrush. Will treat with below. Will check blood sugar at next visit to see if potential cause of thrush.  Report any new, worsening, or persistent symptoms.  -     nystatin (MYCOSTATIN) 100000 UNIT/ML suspension; Take 5 mLs (500,000 Units total) by mouth 4 (four) times daily.     Follow Up Instructions: Return if symptoms worsen or fail to improve.    I discussed the assessment and treatment plan with the patient. The patient was provided an opportunity to ask questions and all were answered. The patient agreed with the plan and demonstrated an understanding of the instructions.   The patient was advised to call back or seek an in-person evaluation if the symptoms worsen or if the condition fails to improve as anticipated.  The above assessment and management plan was discussed with the patient. The patient verbalized understanding of and has agreed to the management plan. Patient is aware to call the clinic if they develop any new symptoms or if symptoms persist or worsen. Patient is aware when to return to the clinic for a follow-up visit. Patient educated on when it is appropriate to go to the emergency department.    I provided 15 minutes of non-face-to-face time during this encounter. The call started at 1600. The call ended at 1615. The other time was used for coordination of care.    Monia Pouch, FNP-C [facility] [address] [place], [place] [address] [phone] 05/28/2019

## 2019-06-03 ENCOUNTER — Telehealth: Payer: Self-pay | Admitting: Family Medicine

## 2019-06-03 NOTE — Telephone Encounter (Signed)
Appt made.  Patient unable to come in until next Monday 2/1

## 2019-06-03 NOTE — Telephone Encounter (Signed)
Needs to be seen in office for evaluation and labs. May need referral for biopsy.

## 2019-06-07 ENCOUNTER — Other Ambulatory Visit: Payer: Self-pay

## 2019-06-10 ENCOUNTER — Encounter: Payer: Self-pay | Admitting: Family Medicine

## 2019-06-10 ENCOUNTER — Ambulatory Visit (INDEPENDENT_AMBULATORY_CARE_PROVIDER_SITE_OTHER): Payer: 59

## 2019-06-10 ENCOUNTER — Other Ambulatory Visit: Payer: Self-pay

## 2019-06-10 ENCOUNTER — Ambulatory Visit (INDEPENDENT_AMBULATORY_CARE_PROVIDER_SITE_OTHER): Payer: 59 | Admitting: Family Medicine

## 2019-06-10 VITALS — BP 109/68 | HR 67 | Temp 97.8°F | Resp 20 | Ht 70.0 in | Wt 252.0 lb

## 2019-06-10 DIAGNOSIS — B370 Candidal stomatitis: Secondary | ICD-10-CM

## 2019-06-10 DIAGNOSIS — R0602 Shortness of breath: Secondary | ICD-10-CM

## 2019-06-10 DIAGNOSIS — K148 Other diseases of tongue: Secondary | ICD-10-CM

## 2019-06-10 LAB — BAYER DCA HB A1C WAIVED: HB A1C (BAYER DCA - WAIVED): 6 % (ref <7.0)

## 2019-06-10 MED ORDER — CHLORHEXIDINE GLUCONATE 0.12 % MT SOLN: 15.0000 mL | Freq: Two times a day (BID) | OROMUCOSAL | 0 refills | Status: DC

## 2019-06-10 NOTE — Patient Instructions (Signed)
Oral Thrush, Adult  Oral thrush, also called oral candidiasis, is a fungal infection that develops in the mouth and throat and on the tongue. It causes white patches to form on the mouth and tongue. Thrush is most common in older adults, but it can occur at any age. Many cases of thrush are mild, but this infection can also be serious. Thrush can be a repeated (recurrent) problem for certain people who have a weak body defense system (immune system). The weakness can be caused by chronic illnesses, or by taking medicines that limit the body's ability to fight infection. If a person has difficulty fighting infection, the fungus that causes thrush can spread through the body. This can cause life-threatening blood or organ infections. What are the causes? This condition is caused by a fungus (yeast) called Candida albicans.  This fungus is normally present in small amounts in the mouth and on other mucous membranes. It usually causes no harm.  If conditions are present that allow the fungus to grow without control, it invades surrounding tissues and becomes an infection.  Other Candida species can also lead to thrush (rare). What increases the risk? This condition is more likely to develop in:  People with a weakened immune system.  Older adults.  People with HIV (human immunodeficiency virus).  People with diabetes.  People with dry mouth (xerostomia).  Pregnant women.  People with poor dental care, especially people who have false teeth.  People who use antibiotic medicines. What are the signs or symptoms? Symptoms of this condition can vary from mild and moderate to severe and persistent. Symptoms may include:  A burning feeling in the mouth and throat. This can occur at the start of a thrush infection.  White patches that stick to the mouth and tongue. The tissue around the patches may be red, raw, and painful. If rubbed (during tooth brushing, for example), the patches and the  tissue of the mouth may bleed easily.  A bad taste in the mouth or difficulty tasting foods.  A cottony feeling in the mouth.  Pain during eating and swallowing.  Poor appetite.  Cracking at the corners of the mouth. How is this diagnosed? This condition is diagnosed based on:  Physical exam. Your health care provider will look in your mouth.  Health history. Your health care provider will ask you questions about your health. How is this treated? This condition is treated with medicines called antifungals, which prevent the growth of fungi. These medicines are either applied directly to the affected area (topical) or swallowed (oral). The treatment will depend on the severity of the condition. Mild thrush Mild cases of thrush may clear up with the use of an antifungal mouth rinse or lozenges. Treatment usually lasts about 14 days. Moderate to severe thrush  More severe thrush infections that have spread to the esophagus are treated with an oral antifungal medicine. A topical antifungal medicine may also be used.  For some severe infections, treatment may need to continue for more than 14 days.  Oral antifungal medicines are rarely used during pregnancy because they may be harmful to the unborn child. If you are pregnant, talk with your health care provider about options for treatment. Persistent or recurrent thrush For cases of thrush that do not go away or keep coming back:  Treatment may be needed twice as long as the symptoms last.  Treatment will include both oral and topical antifungal medicines.  People with a weakened immune system can take   an antifungal medicine on a continuous basis to prevent thrush infections. It is important to treat conditions that make a person more likely to get thrush, such as diabetes or HIV. Follow these instructions at home: Medicines  Take over-the-counter and prescription medicines only as told by your health care provider.  Talk with  your health care provider about an over-the-counter medicine called gentian violet, which kills bacteria and fungi. Relieving soreness and discomfort To help reduce the discomfort of thrush:  Drink cold liquids such as water or iced tea.  Try flavored ice treats or frozen juices.  Eat foods that are easy to swallow, such as gelatin, ice cream, or custard.  Try drinking from a straw if the patches in your mouth are painful.  General instructions  Eat plain, unflavored yogurt as directed by your health care provider. Check the label to make sure the yogurt contains live cultures. This yogurt can help healthy bacteria to grow in the mouth and can stop the growth of the fungus that causes thrush.  If you wear dentures, remove the dentures before going to bed, brush them vigorously, and soak them in a cleaning solution as directed by your health care provider.  Rinse your mouth with a warm salt-water mixture several times a day. To make a salt-water mixture, completely dissolve 1/2-1 tsp of salt in 1 cup of warm water. Contact a health care provider if:  Your symptoms are getting worse or are not improving within 7 days of starting treatment.  You have symptoms of a spreading infection, such as white patches on the skin outside of the mouth. This information is not intended to replace advice given to you by your health care provider. Make sure you discuss any questions you have with your health care provider. Document Revised: 07/28/2017 Document Reviewed: 01/18/2016 Elsevier Patient Education  2020 [employer]  

## 2019-06-10 NOTE — Progress Notes (Signed)
Subjective:  Patient ID: Jacob Cline, male    DOB: [birth year], 59 y.o.   MRN: [id number]  Patient Care Team: Baruch Gouty, FNP as PCP - General (Family Medicine) Nahser, Wonda Cheng, MD as PCP - Cardiology (Cardiology)   Chief Complaint:  Thrush   HPI: Jacob Cline is a 59 y.o. male presenting on 06/10/2019 for Thrush   1. Thrush, oral 2. Tongue discoloration Ongoing thrush symptoms for three weeks despite antifungal treatment. States the coating has improved since completion of antifungal therapy but is still present. States now it is yellow in color and not white. States only on the back of his tongue and not painful.   3. Shortness of breath Pt had COVID-19 PNA in November. States he still has some residual shortness of breath with exertion. No cough or sputum production. No fever, chills, weakness, or fatigue. States he is gaining his strength back slowly.      Relevant past medical, surgical, family, and social history reviewed and updated as indicated.  Allergies and medications reviewed and updated. Date reviewed: Chart in Epic.   Past Medical History:  Diagnosis Date  . BPH (benign prostatic hypertrophy)   . Hemorrhoids   . Hiatal hernia   . Hyperlipidemia   . Hypertension   . Irregular heart beat   . Obesity   . Post-operative nausea and vomiting     Past Surgical History:  Procedure Laterality Date  . athroscopy of rt knee  02/02  . henia - umbilical surgery  4/40/34  . HERNIA REPAIR  05/15/2017   double hernia surgery  . HIP SURGERY  11/2014   right hip  . MOUTH SURGERY    . VASECTOMY      Social History   Socioeconomic History  . Marital status: Married    Spouse name: Not on file  . Number of children: Not on file  . Years of education: Not on file  . Highest education level: Not on file  Occupational History  . Not on file  Tobacco Use  . Smoking status: Never Smoker  . Smokeless tobacco: Never Used  Substance and Sexual Activity    . Alcohol use: No    Alcohol/week: 0.0 standard drinks  . Drug use: No  . Sexual activity: Not on file  Other Topics Concern  . Not on file  Social History Narrative  . Not on file   Social Determinants of Health   Financial Resource Strain:   . Difficulty of Paying Living Expenses: Not on file  Food Insecurity:   . Worried About Charity fundraiser in the Last Year: Not on file  . Ran Out of Food in the Last Year: Not on file  Transportation Needs:   . Lack of Transportation (Medical): Not on file  . Lack of Transportation (Non-Medical): Not on file  Physical Activity:   . Days of Exercise per Week: Not on file  . Minutes of Exercise per Session: Not on file  Stress:   . Feeling of Stress : Not on file  Social Connections:   . Frequency of Communication with Friends and Family: Not on file  . Frequency of Social Gatherings with Friends and Family: Not on file  . Attends Religious Services: Not on file  . Active Member of Clubs or Organizations: Not on file  . Attends Archivist Meetings: Not on file  . Marital Status: Not on file  Intimate Partner Violence:   .  Fear of Current or Ex-Partner: Not on file  . Emotionally Abused: Not on file  . Physically Abused: Not on file  . Sexually Abused: Not on file    Outpatient Encounter Medications as of 06/10/2019  Medication Sig  . acetaminophen (TYLENOL) 500 MG tablet Take by mouth.  [name] albuterol (VENTOLIN HFA) 108 (90 Base) MCG/ACT inhaler Inhale 2 puffs into the lungs every 6 (six) hours as needed for wheezing or shortness of breath.  [name] aspirin 81 MG chewable tablet Chew 81 mg by mouth daily.    . cholecalciferol (VITAMIN D) 1000 UNITS tablet Take 1,000 Units by mouth daily. 3 tablets daily  . Cinnamon 500 MG capsule Take 1,000 mg by mouth daily.   . diclofenac (VOLTAREN) 75 MG EC tablet Take 1 tablet by mouth twice daily  . fluticasone (FLONASE) 50 MCG/ACT nasal spray Place 2 sprays into both nostrils daily.  .  Multiple Vitamin (MULTIVITAMIN) tablet Take 1 tablet by mouth daily.   . Omega-3 Fatty Acids (FISH OIL) 1000 MG CAPS Take 1 capsule by mouth daily.   [name] omeprazole (PRILOSEC) 20 MG capsule Take 1 capsule (20 mg total) by mouth 2 (two) times daily before a meal.  . PARoxetine (PAXIL-CR) 25 MG 24 hr tablet Take 1 tablet (25 mg total) by mouth daily.  . pravastatin (PRAVACHOL) 40 MG tablet Take 1 tablet (40 mg total) by mouth at bedtime.  . sildenafil (REVATIO) 20 MG tablet Take 2-5 tabs  . telmisartan (MICARDIS) 40 MG tablet Take 1 tablet (40 mg total) by mouth daily.  [name] triamterene-hydrochlorothiazide (MAXZIDE-25) 37.5-25 MG tablet Take 0.5 tablets by mouth daily.  . TURMERIC PO Take 1 capsule by mouth daily.   . chlorhexidine (PERIDEX) 0.12 % solution Use as directed 15 mLs in the mouth or throat 2 (two) times daily.  . [DISCONTINUED] nystatin (MYCOSTATIN) 100000 UNIT/ML suspension Take 5 mLs (500,000 Units total) by mouth 4 (four) times daily.   No facility-administered encounter medications on file as of 06/10/2019.    Allergies  Allergen Reactions  . Other Anaphylaxis    Make me hurt alot  . Pravastatin Other (See Comments)    Stiff neck  . Codeine     Unknown   . Lovastatin     Makes me hurt alot  . Niaspan [Niacin Er]     Make me hurt alot    Review of Systems  Constitutional: Negative for activity change, appetite change, chills, diaphoresis, fatigue, fever and unexpected weight change.  HENT:       Tongue discoloration.  Eyes: Negative.  Negative for photophobia and visual disturbance.  Respiratory: Positive for shortness of breath. Negative for cough and chest tightness.   Cardiovascular: Negative for chest pain, palpitations and leg swelling.  Gastrointestinal: Negative for abdominal pain, blood in stool, constipation, diarrhea, nausea and vomiting.  Endocrine: Negative.  Negative for cold intolerance, heat intolerance, polydipsia, polyphagia and polyuria.  Genitourinary:  Negative for decreased urine volume, difficulty urinating, dysuria, frequency and urgency.  Musculoskeletal: Negative for arthralgias and myalgias.  Skin: Negative.  Negative for color change.  Allergic/Immunologic: Negative.   Neurological: Negative for dizziness, tremors, seizures, syncope, facial asymmetry, speech difficulty, weakness, light-headedness, numbness and headaches.  Hematological: Negative.   Psychiatric/Behavioral: Negative for confusion, hallucinations, sleep disturbance and suicidal ideas.  All other systems reviewed and are negative.       Objective:  BP 109/68   Pulse 67   Temp 97.8 F (36.6 C)   Resp 20  Ht _0  (1.778 m)   Wt 252 lb (114.3 kg)   SpO2 96%   BMI 36.16 kg/m    Wt Readings from Last 3 Encounters:  06/10/19 252 lb (114.3 kg)  02/11/19 257 lb (116.6 kg)  11/19/18 254 lb 6.4 oz (115.4 kg)    Physical Exam Vitals and nursing note reviewed.  Constitutional:      General: He is not in acute distress.    Appearance: Normal appearance. He is well-developed and well-groomed. He is not ill-appearing, toxic-appearing or diaphoretic.  HENT:     Head: Normocephalic and atraumatic.     Jaw: There is normal jaw occlusion.     Right Ear: Hearing normal.     Left Ear: Hearing normal.     Nose: Nose normal.     Mouth/Throat:     Lips: Pink.     Mouth: Mucous membranes are moist. No oral lesions.     Tongue: No lesions.     Pharynx: Oropharynx is clear. Uvula midline.      Comments: Yellowish discoloration without erythema. No ulcerations.  Eyes:     General: Lids are normal.     Extraocular Movements: Extraocular movements intact.     Conjunctiva/sclera: Conjunctivae normal.     Pupils: Pupils are equal, round, and reactive to light.  Neck:     Thyroid: No thyroid mass, thyromegaly or thyroid tenderness.     Vascular: No carotid bruit or JVD.     Trachea: Trachea and phonation normal.  Cardiovascular:     Rate and Rhythm: Normal rate and  regular rhythm.     Chest Wall: PMI is not displaced.     Pulses: Normal pulses.     Heart sounds: Normal heart sounds. No murmur. No friction rub. No gallop.   Pulmonary:     Effort: Pulmonary effort is normal. No respiratory distress.     Breath sounds: Normal breath sounds. No wheezing.  Abdominal:     General: Bowel sounds are normal. There is no distension or abdominal bruit.     Palpations: Abdomen is soft. There is no hepatomegaly or splenomegaly.     Tenderness: There is no abdominal tenderness. There is no right CVA tenderness or left CVA tenderness.     Hernia: No hernia is present.  Musculoskeletal:        General: Normal range of motion.     Cervical back: Normal range of motion and neck supple.     Right lower leg: No edema.     Left lower leg: No edema.  Lymphadenopathy:     Cervical: No cervical adenopathy.  Skin:    General: Skin is warm and dry.     Capillary Refill: Capillary refill takes less than 2 seconds.     Coloration: Skin is not cyanotic, jaundiced or pale.     Findings: No rash.  Neurological:     General: No focal deficit present.     Mental Status: He is alert and oriented to person, place, and time.     Cranial Nerves: Cranial nerves are intact. No cranial nerve deficit.     Sensory: Sensation is intact. No sensory deficit.     Motor: Motor function is intact. No weakness.     Coordination: Coordination is intact. Coordination normal.     Gait: Gait is intact. Gait normal.     Deep Tendon Reflexes: Reflexes are normal and symmetric. Reflexes normal.  Psychiatric:        Attention and  Perception: Attention and perception normal.        Mood and Affect: Mood and affect normal.        Speech: Speech normal.        Behavior: Behavior normal. Behavior is cooperative.        Thought Content: Thought content normal.        Cognition and Memory: Cognition and memory normal.        Judgment: Judgment normal.     Results for orders placed or performed  in visit on 02/11/19  CMP14+EGFR  Result Value Ref Range   Glucose 106 (H) 65 - 99 mg/dL   BUN 30 (H) 8 - 27 mg/dL   Creatinine, Ser 0.93 0.76 - 1.27 mg/dL   GFR calc non Af Amer 89 >59 mL/min/1.73   GFR calc Af Amer 103 >59 mL/min/1.73   BUN/Creatinine Ratio 32 (H) 10 - 24   Sodium 140 134 - 144 mmol/L   Potassium 4.4 3.5 - 5.2 mmol/L   Chloride 100 96 - 106 mmol/L   CO2 26 20 - 29 mmol/L   Calcium 9.0 8.6 - 10.2 mg/dL   Total Protein 6.5 6.0 - 8.5 g/dL   Albumin 4.1 3.8 - 4.9 g/dL   Globulin, Total 2.4 1.5 - 4.5 g/dL   Albumin/Globulin Ratio 1.7 1.2 - 2.2   Bilirubin Total 0.3 0.0 - 1.2 mg/dL   Alkaline Phosphatase 82 39 - 117 IU/L   AST 24 0 - 40 IU/L   ALT 38 0 - 44 IU/L  CBC with Differential/Platelet  Result Value Ref Range   WBC 7.3 3.4 - 10.8 x10E3/uL   RBC 4.44 4.14 - 5.80 x10E6/uL   Hemoglobin 13.9 13.0 - 17.7 g/dL   Hematocrit 41.1 37.5 - 51.0 %   MCV 93 79 - 97 fL   MCH 31.3 26.6 - 33.0 pg   MCHC 33.8 31.5 - 35.7 g/dL   RDW 13.1 11.6 - 15.4 %   Platelets 178 150 - 450 x10E3/uL   Neutrophils 65 Not Estab. %   Lymphs 20 Not Estab. %   Monocytes 9 Not Estab. %   Eos 5 Not Estab. %   Basos 1 Not Estab. %   Neutrophils Absolute 4.8 1.4 - 7.0 x10E3/uL   Lymphocytes Absolute 1.5 0.7 - 3.1 x10E3/uL   Monocytes Absolute 0.6 0.1 - 0.9 x10E3/uL   EOS (ABSOLUTE) 0.4 0.0 - 0.4 x10E3/uL   Basophils Absolute 0.0 0.0 - 0.2 x10E3/uL   Immature Granulocytes 0 Not Estab. %   Immature Grans (Abs) 0.0 0.0 - 0.1 x10E3/uL  Lipid panel  Result Value Ref Range   Cholesterol, Total 143 100 - 199 mg/dL   Triglycerides 127 0 - 149 mg/dL   HDL 40 >39 mg/dL   VLDL Cholesterol Cal 23 5 - 40 mg/dL   LDL Chol Calc (NIH) 80 0 - 99 mg/dL   Chol/HDL Ratio 3.6 0.0 - 5.0 ratio  Thyroid Panel With TSH  Result Value Ref Range   TSH 1.340 0.450 - 4.500 uIU/mL   T4, Total 5.6 4.5 - 12.0 ug/dL   T3 Uptake Ratio 26 24 - 39 %   Free Thyroxine Index 1.5 1.2 - 4.9  VITAMIN D 25 Hydroxy (Vit-D  Deficiency, Fractures)  Result Value Ref Range   Vit D, 25-Hydroxy 31.6 30.0 - 100.0 ng/mL     X-Ray: CXR: No acute findings. Preliminary x-ray reading by Monia Pouch, FNP-C, [facility].   Pertinent labs & imaging results that were available  during my care of the patient were reviewed by me and considered in my medical decision making.  Assessment & Plan:  Jacob Cline was seen today for thrush.  Diagnoses and all orders for this visit:  Thrush, oral Tongue discoloration Ongoing and recurrent symptoms, improved at present. Will check below for potential underlying causes: DM, liver disease. Pt has had previous HIV testing that was negative. Not high risk. Will initiate Peridex today. Pt aware to report any new, worsening, or symptoms.  -     Bayer DCA Hb A1c Waived -     CMP14+EGFR -     CBC with Differential/Platelet -     chlorhexidine (PERIDEX) 0.12 % solution; Use as directed 15 mLs in the mouth or throat 2 (two) times daily.  Shortness of breath Previous COVID-19 pneumonia. Has residual shortness of breath with exertion. No cough or sputum production. No fever, chills, weakness, or syncope. CXR without acute findings.  -     DG Chest 2 View; Future     Continue all other maintenance medications.  Follow up plan: Return in about 4 weeks (around 07/08/2019), or if symptoms worsen or fail to improve.  Continue healthy lifestyle choices, including diet (rich in fruits, vegetables, and lean proteins, and low in salt and simple carbohydrates) and exercise (at least 30 minutes of moderate physical activity daily).  Educational handout given for Ritta Slot  The above assessment and management plan was discussed with the patient. The patient verbalized understanding of and has agreed to the management plan. Patient is aware to call the clinic if they develop any new symptoms or if symptoms persist or worsen. Patient is aware when to return to the clinic for a follow-up visit. Patient educated on  when it is appropriate to go to the emergency department.   Monia Pouch, FNP-C [facility] [phone]

## 2019-06-11 LAB — CMP14+EGFR
ALT: 27 IU/L (ref 0–44)
AST: 22 IU/L (ref 0–40)
Albumin/Globulin Ratio: 1.7 (ref 1.2–2.2)
Albumin: 4.5 g/dL (ref 3.8–4.9)
Alkaline Phosphatase: 79 IU/L (ref 39–117)
BUN/Creatinine Ratio: 24 (ref 10–24)
BUN: 23 mg/dL (ref 8–27)
Bilirubin Total: 0.3 mg/dL (ref 0.0–1.2)
CO2: 26 mmol/L (ref 20–29)
Calcium: 9.3 mg/dL (ref 8.6–10.2)
Chloride: 97 mmol/L (ref 96–106)
Creatinine, Ser: 0.95 mg/dL (ref 0.76–1.27)
GFR calc Af Amer: 100 mL/min/{1.73_m2} (ref >59)
GFR calc non Af Amer: 87 mL/min/{1.73_m2} (ref >59)
Globulin, Total: 2.6 g/dL (ref 1.5–4.5)
Glucose: 107 mg/dL — ABNORMAL HIGH (ref 65–99)
Potassium: 4.3 mmol/L (ref 3.5–5.2)
Sodium: 137 mmol/L (ref 134–144)
Total Protein: 7.1 g/dL (ref 6.0–8.5)

## 2019-06-11 LAB — CBC WITH DIFFERENTIAL/PLATELET
Basophils Absolute: 0 10*3/uL (ref 0.0–0.2)
Basos: 1 %
EOS (ABSOLUTE): 0.2 10*3/uL (ref 0.0–0.4)
Eos: 3 %
Hematocrit: 46.3 % (ref 37.5–51.0)
Hemoglobin: 15.6 g/dL (ref 13.0–17.7)
Immature Grans (Abs): 0 10*3/uL (ref 0.0–0.1)
Immature Granulocytes: 0 %
Lymphocytes Absolute: 2 10*3/uL (ref 0.7–3.1)
Lymphs: 26 %
MCH: 29.9 pg (ref 26.6–33.0)
MCHC: 33.7 g/dL (ref 31.5–35.7)
MCV: 89 fL (ref 79–97)
Monocytes Absolute: 0.8 10*3/uL (ref 0.1–0.9)
Monocytes: 10 %
Neutrophils Absolute: 4.7 10*3/uL (ref 1.4–7.0)
Neutrophils: 60 %
Platelets: 199 10*3/uL (ref 150–450)
RBC: 5.21 x10E6/uL (ref 4.14–5.80)
RDW: 13 % (ref 11.6–15.4)
WBC: 7.8 10*3/uL (ref 3.4–10.8)

## 2019-06-12 ENCOUNTER — Other Ambulatory Visit: Payer: Self-pay | Admitting: Family Medicine

## 2019-06-28 ENCOUNTER — Other Ambulatory Visit: Payer: Self-pay | Admitting: *Deleted

## 2019-06-28 ENCOUNTER — Telehealth: Payer: Self-pay | Admitting: Family Medicine

## 2019-06-28 DIAGNOSIS — K148 Other diseases of tongue: Secondary | ICD-10-CM

## 2019-06-28 DIAGNOSIS — B370 Candidal stomatitis: Secondary | ICD-10-CM

## 2019-06-28 NOTE — Telephone Encounter (Signed)
I would like to refer him to ENT due to the recurrent nature of his symptoms. Does he have a preference as to where he goes?

## 2019-06-28 NOTE — Telephone Encounter (Signed)
What symptoms do you have? Tongue has a yellowish color  How long have you been sick? About 4-5 weeks  Have you been seen for this problem? Yes, was prescribed a mouth wash, tongue is a little lighter but still yellow  If your provider decides to give you a prescription, which pharmacy would you like for it to be sent to? [facility] [place]    Patient informed that this information will be sent to the clinical staff for review and that they should receive a follow up call.

## 2019-06-28 NOTE — Telephone Encounter (Signed)
Patient was seen a few weeks ago and treated with Peridex for thrush.  He finished the medication.  He is not experiencing any irritation in his mouth but still feels that the tongue has a yellow discoloration.  He is wondering if he should have another round of medication or perhaps a different medication.

## 2019-06-28 NOTE — Telephone Encounter (Signed)
Patient has no preference other than Monday morning appts if possible.  Referral placed.

## 2019-07-05 ENCOUNTER — Other Ambulatory Visit: Payer: Self-pay | Admitting: Family Medicine

## 2019-07-05 ENCOUNTER — Telehealth: Payer: Self-pay | Admitting: Family Medicine

## 2019-07-05 NOTE — Telephone Encounter (Signed)
Patient aware and verbalized understanding. Patient Aware Jacob Cline is working on another ENT.

## 2019-07-14 ENCOUNTER — Other Ambulatory Visit: Payer: Self-pay | Admitting: Cardiovascular Disease

## 2019-07-14 ENCOUNTER — Other Ambulatory Visit: Payer: Self-pay | Admitting: Family Medicine

## 2019-08-05 ENCOUNTER — Other Ambulatory Visit: Payer: Self-pay | Admitting: Family Medicine

## 2019-08-12 ENCOUNTER — Ambulatory Visit (INDEPENDENT_AMBULATORY_CARE_PROVIDER_SITE_OTHER): Payer: 59 | Admitting: Family Medicine

## 2019-08-12 ENCOUNTER — Encounter: Payer: Self-pay | Admitting: Family Medicine

## 2019-08-12 DIAGNOSIS — I10 Essential (primary) hypertension: Secondary | ICD-10-CM | POA: Diagnosis not present

## 2019-08-12 DIAGNOSIS — E7800 Pure hypercholesterolemia, unspecified: Secondary | ICD-10-CM

## 2019-08-12 DIAGNOSIS — I712 Thoracic aortic aneurysm, without rupture, unspecified: Secondary | ICD-10-CM

## 2019-08-12 DIAGNOSIS — N5201 Erectile dysfunction due to arterial insufficiency: Secondary | ICD-10-CM

## 2019-08-12 DIAGNOSIS — E559 Vitamin D deficiency, unspecified: Secondary | ICD-10-CM

## 2019-08-12 DIAGNOSIS — F411 Generalized anxiety disorder: Secondary | ICD-10-CM

## 2019-08-12 DIAGNOSIS — M7742 Metatarsalgia, left foot: Secondary | ICD-10-CM

## 2019-08-12 DIAGNOSIS — F339 Major depressive disorder, recurrent, unspecified: Secondary | ICD-10-CM

## 2019-08-12 DIAGNOSIS — M7741 Metatarsalgia, right foot: Secondary | ICD-10-CM

## 2019-08-12 DIAGNOSIS — K449 Diaphragmatic hernia without obstruction or gangrene: Secondary | ICD-10-CM

## 2019-08-12 DIAGNOSIS — K219 Gastro-esophageal reflux disease without esophagitis: Secondary | ICD-10-CM

## 2019-08-12 MED ORDER — PRAVASTATIN SODIUM 40 MG PO TABS: 40.0000 mg | ORAL_TABLET | Freq: Every day | ORAL | 11 refills | Status: DC

## 2019-08-12 MED ORDER — TRIAMTERENE-HCTZ 37.5-25 MG PO TABS: 1.0000 | ORAL_TABLET | Freq: Every day | ORAL | 5 refills | Status: DC

## 2019-08-12 MED ORDER — DICLOFENAC SODIUM 75 MG PO TBEC: 75.0000 mg | DELAYED_RELEASE_TABLET | Freq: Two times a day (BID) | ORAL | 2 refills | Status: DC

## 2019-08-12 MED ORDER — SILDENAFIL CITRATE 20 MG PO TABS: ORAL_TABLET | ORAL | 2 refills | Status: DC

## 2019-08-12 MED ORDER — OMEPRAZOLE 20 MG PO CPDR: DELAYED_RELEASE_CAPSULE | ORAL | 11 refills | Status: DC

## 2019-08-12 MED ORDER — PAROXETINE HCL ER 25 MG PO TB24: 25.0000 mg | ORAL_TABLET | Freq: Every day | ORAL | 11 refills | Status: DC

## 2019-08-12 MED ORDER — TELMISARTAN 40 MG PO TABS: 40.0000 mg | ORAL_TABLET | Freq: Every day | ORAL | 6 refills | Status: DC

## 2019-08-12 NOTE — Progress Notes (Deleted)
Virtual Visit via telephone Note Due to COVID-19 pandemic this visit was conducted virtually. This visit type was conducted due to national recommendations for restrictions regarding the COVID-19 Pandemic (e.g. social distancing, sheltering in place) in an effort to limit this patient's exposure and mitigate transmission in our community. All issues noted in this document were discussed and addressed.  A physical exam was not performed with this format.   I connected with Jacob Cline on 08/12/2019 at *** by telephone and verified that I am speaking with the correct person using two identifiers. Jacob Cline is currently located at *** and {family members:[id number]} is currently with them during visit. The provider, Kari Baars, FNP is located in their office at time of visit.  I discussed the limitations, risks, security and privacy concerns of performing an evaluation and management service by telephone and the availability of in person appointments. I also discussed with the patient that there may be a patient responsible charge related to this service. The patient expressed understanding and agreed to proceed.  Subjective:  Patient ID: Jacob Cline, male    DOB: 08-07-1960, 59 y.o.   MRN: [id number]  Chief Complaint:  Medical Management of Chronic Issues   HPI: Jacob Cline is a 60 y.o. male presenting on 08/12/2019 for Medical Management of Chronic Issues   HPI   Relevant past medical, surgical, family, and social history reviewed and updated as indicated.  Allergies and medications reviewed and updated.   Past Medical History:  Diagnosis Date  . BPH (benign prostatic hypertrophy)   . Hemorrhoids   . Hiatal hernia   . Hyperlipidemia   . Hypertension   . Irregular heart beat   . Obesity   . Post-operative nausea and vomiting     Past Surgical History:  Procedure Laterality Date  . athroscopy of rt knee  02/02  . henia - umbilical surgery  07/17/07  . HERNIA REPAIR   05/15/2017   double hernia surgery  . HIP SURGERY  11/2014   right hip  . MOUTH SURGERY    . VASECTOMY      Social History   Socioeconomic History  . Marital status: Married    Spouse name: Not on file  . Number of children: Not on file  . Years of education: Not on file  . Highest education level: Not on file  Occupational History  . Not on file  Tobacco Use  . Smoking status: Never Smoker  . Smokeless tobacco: Never Used  Substance and Sexual Activity  . Alcohol use: No    Alcohol/week: 0.0 standard drinks  . Drug use: No  . Sexual activity: Not on file  Other Topics Concern  . Not on file  Social History Narrative  . Not on file   Social Determinants of Health   Financial Resource Strain:   . Difficulty of Paying Living Expenses:   Food Insecurity:   . Worried About Programme researcher, broadcasting/film/video in the Last Year:   . Barista in the Last Year:   Transportation Needs:   . Freight forwarder (Medical):   [name] Lack of Transportation (Non-Medical):   Physical Activity:   . Days of Exercise per Week:   . Minutes of Exercise per Session:   Stress:   . Feeling of Stress :   Social Connections:   . Frequency of Communication with Friends and Family:   . Frequency of Social Gatherings with Friends and Family:   .  Attends Religious Services:   . Active Member of Clubs or Organizations:   . Attends Banker Meetings:   [name] Marital Status:   Intimate Partner Violence:   . Fear of Current or Ex-Partner:   . Emotionally Abused:   [name] Physically Abused:   . Sexually Abused:     Outpatient Encounter Medications as of 08/12/2019  Medication Sig  . acetaminophen (TYLENOL) 500 MG tablet Take by mouth.  [name] albuterol (VENTOLIN HFA) 108 (90 Base) MCG/ACT inhaler Inhale 2 puffs into the lungs every 6 (six) hours as needed for wheezing or shortness of breath.  [name] aspirin 81 MG chewable tablet Chew 81 mg by mouth daily.    . chlorhexidine (PERIDEX) 0.12 % solution Use as  directed 15 mLs in the mouth or throat 2 (two) times daily.  . cholecalciferol (VITAMIN D) 1000 UNITS tablet Take 1,000 Units by mouth daily. 3 tablets daily  . Cinnamon 500 MG capsule Take 1,000 mg by mouth daily.   . diclofenac (VOLTAREN) 75 MG EC tablet Take 1 tablet (75 mg total) by mouth 2 (two) times daily.  . fluticasone (FLONASE) 50 MCG/ACT nasal spray Place 2 sprays into both nostrils daily.  . Multiple Vitamin (MULTIVITAMIN) tablet Take 1 tablet by mouth daily.   . Omega-3 Fatty Acids (FISH OIL) 1000 MG CAPS Take 1 capsule by mouth daily.   [name] omeprazole (PRILOSEC) 20 MG capsule TAKE 1 CAPSULE BY MOUTH TWICE DAILY BEFORE MEAL(S)  . PARoxetine (PAXIL-CR) 25 MG 24 hr tablet Take 1 tablet (25 mg total) by mouth daily.  . pravastatin (PRAVACHOL) 40 MG tablet Take 1 tablet (40 mg total) by mouth at bedtime.  . sildenafil (REVATIO) 20 MG tablet Take 2-5 tabs  . telmisartan (MICARDIS) 40 MG tablet Take 1 tablet (40 mg total) by mouth daily.  [name] triamterene-hydrochlorothiazide (MAXZIDE-25) 37.5-25 MG tablet Take 1 tablet by mouth daily.  . TURMERIC PO Take 1 capsule by mouth daily.   . [DISCONTINUED] diclofenac (VOLTAREN) 75 MG EC tablet Take 1 tablet by mouth twice daily  . [DISCONTINUED] omeprazole (PRILOSEC) 20 MG capsule TAKE 1 CAPSULE BY MOUTH TWICE DAILY BEFORE MEAL(S)  . [DISCONTINUED] PARoxetine (PAXIL-CR) 25 MG 24 hr tablet Take 1 tablet by mouth once daily  . [DISCONTINUED] pravastatin (PRAVACHOL) 40 MG tablet TAKE 1 TABLET BY MOUTH AT BEDTIME  . [DISCONTINUED] sildenafil (REVATIO) 20 MG tablet Take 2-5 tabs  . [DISCONTINUED] telmisartan (MICARDIS) 40 MG tablet Take 1 tablet by mouth once daily  . [DISCONTINUED] triamterene-hydrochlorothiazide (MAXZIDE-25) 37.5-25 MG tablet Take 1 tablet by mouth once daily   No facility-administered encounter medications on file as of 08/12/2019.    Allergies  Allergen Reactions  . Other Anaphylaxis    Make me hurt alot  . Pravastatin Other (See  Comments)    Stiff neck  . Codeine     Unknown   . Lovastatin     Makes me hurt alot  . Niaspan [Niacin Er]     Make me hurt alot    Review of Systems       Observations/Objective: No vital signs or physical exam, this was a telephone or virtual health encounter.  Pt alert and oriented, answers all questions appropriately, and able to speak in full sentences.    Assessment and Plan: Tywan was seen today for medical management of chronic issues.  Diagnoses and all orders for this visit:  Essential hypertension -     telmisartan (MICARDIS) 40 MG tablet; Take 1 tablet (  40 mg total) by mouth daily. -     triamterene-hydrochlorothiazide (MAXZIDE-25) 37.5-25 MG tablet; Take 1 tablet by mouth daily.  Depression, recurrent (HCC) -     PARoxetine (PAXIL-CR) 25 MG 24 hr tablet; Take 1 tablet (25 mg total) by mouth daily.  GAD (generalized anxiety disorder) -     PARoxetine (PAXIL-CR) 25 MG 24 hr tablet; Take 1 tablet (25 mg total) by mouth daily.  Pure hypercholesterolemia -     pravastatin (PRAVACHOL) 40 MG tablet; Take 1 tablet (40 mg total) by mouth at bedtime.  Morbid obesity (HCC)  Vitamin D deficiency  Metatarsalgia of both feet -     diclofenac (VOLTAREN) 75 MG EC tablet; Take 1 tablet (75 mg total) by mouth 2 (two) times daily.  Hiatal hernia with gastroesophageal reflux -     omeprazole (PRILOSEC) 20 MG capsule; TAKE 1 CAPSULE BY MOUTH TWICE DAILY BEFORE MEAL(S)  Erectile dysfunction due to arterial insufficiency -     sildenafil (REVATIO) 20 MG tablet; Take 2-5 tabs  Thoracic aortic aneurysm without rupture ([place])     Follow Up Instructions: Return in about 6 months (around 02/11/2020), or if symptoms worsen or fail to improve, for HTN, Lipids.    I discussed the assessment and treatment plan with the patient. The patient was provided an opportunity to ask questions and all were answered. The patient agreed with the plan and demonstrated an understanding of  the instructions.   The patient was advised to call back or seek an in-person evaluation if the symptoms worsen or if the condition fails to improve as anticipated.  The above assessment and management plan was discussed with the patient. The patient verbalized understanding of and has agreed to the management plan. Patient is aware to call the clinic if they develop any new symptoms or if symptoms persist or worsen. Patient is aware when to return to the clinic for a follow-up visit. Patient educated on when it is appropriate to go to the emergency department.    I provided *** minutes of non-face-to-face time during this encounter. The call started at ***. The call ended at ***. The other time was used for coordination of care.    Monia Pouch, FNP-C [facility] [address] [place], [place] [address] [phone] 08/12/2019

## 2019-08-12 NOTE — Progress Notes (Signed)
Virtual Visit via telephone Note Due to COVID-19 pandemic this visit was conducted virtually. This visit type was conducted due to national recommendations for restrictions regarding the COVID-19 Pandemic (e.g. social distancing, sheltering in place) in an effort to limit this patient's exposure and mitigate transmission in our community. All issues noted in this document were discussed and addressed.  A physical exam was not performed with this format.   I connected with Jacob Cline on 08/12/2019 at 0750 by telephone and verified that I am speaking with the correct person using two identifiers. Jacob Cline is currently located at home and family is currently with them during visit. The provider, Monia Pouch, FNP is located in their office at time of visit.  I discussed the limitations, risks, security and privacy concerns of performing an evaluation and management service by telephone and the availability of in person appointments. I also discussed with the patient that there may be a patient responsible charge related to this service. The patient expressed understanding and agreed to proceed.  Subjective:  Patient ID: Jacob Cline, male    DOB: 03-15-1961, 59 y.o.   MRN: [id number]  Chief Complaint:  Medical Management of Chronic Issues   HPI: Jacob Cline is a 59 y.o. male presenting on 08/12/2019 for Medical Management of Chronic Issues  1. Essential hypertension Denies high or low readings at home. Compliant with medications without associated side effects. No headaches, chest pain, SHOB, leg swelling, palpitations, or syncope. Does not follow a strict diet or exercise routine.   2. Depression, recurrent ([place]) Well controlled on current medications.  Depression screen [facility] 2/9 08/12/2019 06/10/2019 04/10/2019 02/11/2019 11/12/2018  Decreased Interest 0 0 0 0 0  Down, Depressed, Hopeless 0 0 0 0 0  PHQ - 2 Score 0 0 0 0 0  Altered sleeping 0 - 0 - -  Tired, decreased energy 0 - 0 - -    Change in appetite 0 - 0 - -  Feeling bad or failure about yourself  0 - 0 - -  Trouble concentrating 0 - 0 - -  Moving slowly or fidgety/restless 0 - 0 - -  Suicidal thoughts 0 - 0 - -  PHQ-9 Score 0 - 0 - -     3. GAD (generalized anxiety disorder) Well controlled on current medications.  GAD 7 : Generalized Anxiety Score 08/12/2019  Nervous, Anxious, on Edge 0  Control/stop worrying 0  Worry too much - different things 0  Trouble relaxing 0  Restless 0  Easily annoyed or irritable 0  Afraid - awful might happen 0  Total GAD 7 Score 0    4. Pure hypercholesterolemia Compliant with statin therapy without associated side effects. Does not follow a strict diet or exercise routine.   5. Morbid obesity ([place]) Does not diet or exercise on a regular basis.   6. Vitamin D deficiency On repletion therapy and doing well. No worsening arthralgias or myalgias. No recent fractures.   7. Metatarsalgia of both feet Ongoing pain in bilateral feet that is well controlled with Voltaren. No associated side effects from the Voltaren. No new injuries or worsening symptoms.   8. Hiatal hernia with gastroesophageal reflux Compliant with omeprazole with great control of symptoms.   9. Erectile dysfunction due to arterial insufficiency Takes sildenafil as needed and this works well.   10. Thoracic aortic aneurysm without rupture ([place]) On asa and statin therapy. No chest pain, abdominal pain, leg tingling or numbness, no  color changes in legs, no cold intolerance in legs.    Relevant past medical, surgical, family, and social history reviewed and updated as indicated.  Allergies and medications reviewed and updated.   Past Medical History:  Diagnosis Date  . BPH (benign prostatic hypertrophy)   . Hemorrhoids   . Hiatal hernia   . Hyperlipidemia   . Hypertension   . Irregular heart beat   . Obesity   . Post-operative nausea and vomiting     Past Surgical History:  Procedure  Laterality Date  . athroscopy of rt knee  02/02  . henia - umbilical surgery  07/17/07  . HERNIA REPAIR  05/15/2017   double hernia surgery  . HIP SURGERY  11/2014   right hip  . MOUTH SURGERY    . VASECTOMY      Social History   Socioeconomic History  . Marital status: Married    Spouse name: Not on file  . Number of children: Not on file  . Years of education: Not on file  . Highest education level: Not on file  Occupational History  . Not on file  Tobacco Use  . Smoking status: Never Smoker  . Smokeless tobacco: Never Used  Substance and Sexual Activity  . Alcohol use: No    Alcohol/week: 0.0 standard drinks  . Drug use: No  . Sexual activity: Not on file  Other Topics Concern  . Not on file  Social History Narrative  . Not on file   Social Determinants of Health   Financial Resource Strain:   . Difficulty of Paying Living Expenses:   Food Insecurity:   . Worried About Programme researcher, broadcasting/film/video in the Last Year:   . Barista in the Last Year:   Transportation Needs:   . Freight forwarder (Medical):   [name] Lack of Transportation (Non-Medical):   Physical Activity:   . Days of Exercise per Week:   . Minutes of Exercise per Session:   Stress:   . Feeling of Stress :   Social Connections:   . Frequency of Communication with Friends and Family:   . Frequency of Social Gatherings with Friends and Family:   . Attends Religious Services:   . Active Member of Clubs or Organizations:   . Attends Banker Meetings:   [name] Marital Status:   Intimate Partner Violence:   . Fear of Current or Ex-Partner:   . Emotionally Abused:   [name] Physically Abused:   . Sexually Abused:     Outpatient Encounter Medications as of 08/12/2019  Medication Sig  . acetaminophen (TYLENOL) 500 MG tablet Take by mouth.  [name] albuterol (VENTOLIN HFA) 108 (90 Base) MCG/ACT inhaler Inhale 2 puffs into the lungs every 6 (six) hours as needed for wheezing or shortness of breath.  [name]  aspirin 81 MG chewable tablet Chew 81 mg by mouth daily.    . chlorhexidine (PERIDEX) 0.12 % solution Use as directed 15 mLs in the mouth or throat 2 (two) times daily.  . cholecalciferol (VITAMIN D) 1000 UNITS tablet Take 1,000 Units by mouth daily. 3 tablets daily  . Cinnamon 500 MG capsule Take 1,000 mg by mouth daily.   . diclofenac (VOLTAREN) 75 MG EC tablet Take 1 tablet (75 mg total) by mouth 2 (two) times daily.  . fluticasone (FLONASE) 50 MCG/ACT nasal spray Place 2 sprays into both nostrils daily.  . Multiple Vitamin (MULTIVITAMIN) tablet Take 1 tablet by mouth daily.   [name]  Omega-3 Fatty Acids (FISH OIL) 1000 MG CAPS Take 1 capsule by mouth daily.   [name] omeprazole (PRILOSEC) 20 MG capsule TAKE 1 CAPSULE BY MOUTH TWICE DAILY BEFORE MEAL(S)  . PARoxetine (PAXIL-CR) 25 MG 24 hr tablet Take 1 tablet (25 mg total) by mouth daily.  . pravastatin (PRAVACHOL) 40 MG tablet Take 1 tablet (40 mg total) by mouth at bedtime.  . sildenafil (REVATIO) 20 MG tablet Take 2-5 tabs  . telmisartan (MICARDIS) 40 MG tablet Take 1 tablet (40 mg total) by mouth daily.  [name] triamterene-hydrochlorothiazide (MAXZIDE-25) 37.5-25 MG tablet Take 1 tablet by mouth daily.  . TURMERIC PO Take 1 capsule by mouth daily.   . [DISCONTINUED] diclofenac (VOLTAREN) 75 MG EC tablet Take 1 tablet by mouth twice daily  . [DISCONTINUED] omeprazole (PRILOSEC) 20 MG capsule TAKE 1 CAPSULE BY MOUTH TWICE DAILY BEFORE MEAL(S)  . [DISCONTINUED] PARoxetine (PAXIL-CR) 25 MG 24 hr tablet Take 1 tablet by mouth once daily  . [DISCONTINUED] pravastatin (PRAVACHOL) 40 MG tablet TAKE 1 TABLET BY MOUTH AT BEDTIME  . [DISCONTINUED] sildenafil (REVATIO) 20 MG tablet Take 2-5 tabs  . [DISCONTINUED] telmisartan (MICARDIS) 40 MG tablet Take 1 tablet by mouth once daily  . [DISCONTINUED] triamterene-hydrochlorothiazide (MAXZIDE-25) 37.5-25 MG tablet Take 1 tablet by mouth once daily   No facility-administered encounter medications on file as of  08/12/2019.    Allergies  Allergen Reactions  . Other Anaphylaxis    Make me hurt alot  . Pravastatin Other (See Comments)    Stiff neck  . Codeine     Unknown   . Lovastatin     Makes me hurt alot  . Niaspan [Niacin Er]     Make me hurt alot    Review of Systems  Constitutional: Negative for activity change, appetite change, chills, diaphoresis, fatigue, fever and unexpected weight change.  HENT:       Tongue discoloration  Eyes: Negative.  Negative for photophobia and visual disturbance.  Respiratory: Negative for cough, chest tightness and shortness of breath.   Cardiovascular: Negative for chest pain, palpitations and leg swelling.  Gastrointestinal: Negative for abdominal pain, anal bleeding, blood in stool, constipation, diarrhea and nausea.  Endocrine: Negative.  Negative for cold intolerance, heat intolerance, polydipsia, polyphagia and polyuria.  Genitourinary: Negative for decreased urine volume, difficulty urinating, discharge, dysuria, enuresis, flank pain, frequency, genital sores, hematuria, penile pain, penile swelling, scrotal swelling, testicular pain and urgency.  Musculoskeletal: Positive for arthralgias and myalgias.  Skin: Negative.   Allergic/Immunologic: Negative.   Neurological: Negative for dizziness, tremors, seizures, syncope, facial asymmetry, speech difficulty, weakness, light-headedness, numbness and headaches.  Hematological: Negative.   Psychiatric/Behavioral: Negative for agitation, behavioral problems, confusion, decreased concentration, dysphoric mood, hallucinations, self-injury, sleep disturbance and suicidal ideas. The patient is not nervous/anxious and is not hyperactive.   All other systems reviewed and are negative.        Observations/Objective: No vital signs or physical exam, this was a telephone or virtual health encounter.  Pt alert and oriented, answers all questions appropriately, and able to speak in full sentences.     Assessment and Plan: Jacob Cline was seen today for medical management of chronic issues.  Diagnoses and all orders for this visit:  Essential hypertension Doing well on below. Will continue. Diet and exercise encouraged. Pt aware to report any high or low readings.  -     telmisartan (MICARDIS) 40 MG tablet; Take 1 tablet (40 mg total) by mouth daily. -  triamterene-hydrochlorothiazide (MAXZIDE-25) 37.5-25 MG tablet; Take 1 tablet by mouth daily.  Depression, recurrent (HCC) GAD (generalized anxiety disorder) Doing very well on below. Will continue.  -     PARoxetine (PAXIL-CR) 25 MG 24 hr tablet; Take 1 tablet (25 mg total) by mouth daily.  Pure hypercholesterolemia Diet encouraged - increase intake of fresh fruits and vegetables, increase intake of lean proteins. Bake, broil, or grill foods. Avoid fried, greasy, and fatty foods. Avoid fast foods. Increase intake of fiber-rich whole grains. Exercise encouraged - at least 150 minutes per week and advance as tolerated.  Goal BMI < 25. Continue medications as prescribed. Follow up in 3-6 months as discussed.  -     pravastatin (PRAVACHOL) 40 MG tablet; Take 1 tablet (40 mg total) by mouth at bedtime.  Morbid obesity (HCC) Diet and exercise encouraged.   Vitamin D deficiency Labs pending. Continue repletion therapy. If indicated, will change repletion dosage. Eat foods rich in Vit D including milk, orange juice, yogurt with vitamin D added, salmon or mackerel, canned tuna fish, cereals with vitamin D added, and cod liver oil. Get out in the sun but make sure to wear at least SPF 30 sunscreen.   Metatarsalgia of both feet Doing well on below. Renal function stable. Will continue.  -     diclofenac (VOLTAREN) 75 MG EC tablet; Take 1 tablet (75 mg total) by mouth 2 (two) times daily.  Hiatal hernia with gastroesophageal reflux No red flags present. Diet discussed. Avoid fried, spicy, fatty, greasy, and acidic foods. Avoid caffeine,  nicotine, and alcohol. Do not eat 2-3 hours before bedtime and stay upright for at least 1-2 hours after eating. Eat small frequent meals. Avoid NSAID's like motrin and aleve. Medications as prescribed. Report any new or worsening symptoms. Follow up as discussed or sooner if needed.   -     omeprazole (PRILOSEC) 20 MG capsule; TAKE 1 CAPSULE BY MOUTH TWICE DAILY BEFORE MEAL(S)  Erectile dysfunction due to arterial insufficiency Does well with below. Will continue.  -     sildenafil (REVATIO) 20 MG tablet; Take 2-5 tabs  Thoracic aortic aneurysm without rupture (HCC) Continue ASA and statin therapy.     Follow Up Instructions: Return in about 6 months (around 02/11/2020), or if symptoms worsen or fail to improve, for HTN, Lipids.    I discussed the assessment and treatment plan with the patient. The patient was provided an opportunity to ask questions and all were answered. The patient agreed with the plan and demonstrated an understanding of the instructions.   The patient was advised to call back or seek an in-person evaluation if the symptoms worsen or if the condition fails to improve as anticipated.  The above assessment and management plan was discussed with the patient. The patient verbalized understanding of and has agreed to the management plan. Patient is aware to call the clinic if they develop any new symptoms or if symptoms persist or worsen. Patient is aware when to return to the clinic for a follow-up visit. Patient educated on when it is appropriate to go to the emergency department.    I provided 25 minutes of non-face-to-face time during this encounter. The call started at 0750. The call ended at 0815. The other time was used for coordination of care.    Kari Baars, FNP-C [facility] [address] [place], [place] [address] [phone] 08/12/2019

## 2019-08-14 IMAGING — DX DG CHEST 2V
2 series · 2 of 2 positions shown · non-contrast
Comparison: December 18, 2017

CLINICAL DATA: Cough and congestion with fever

EXAM:
CHEST - 2 VIEW

[chest pa]
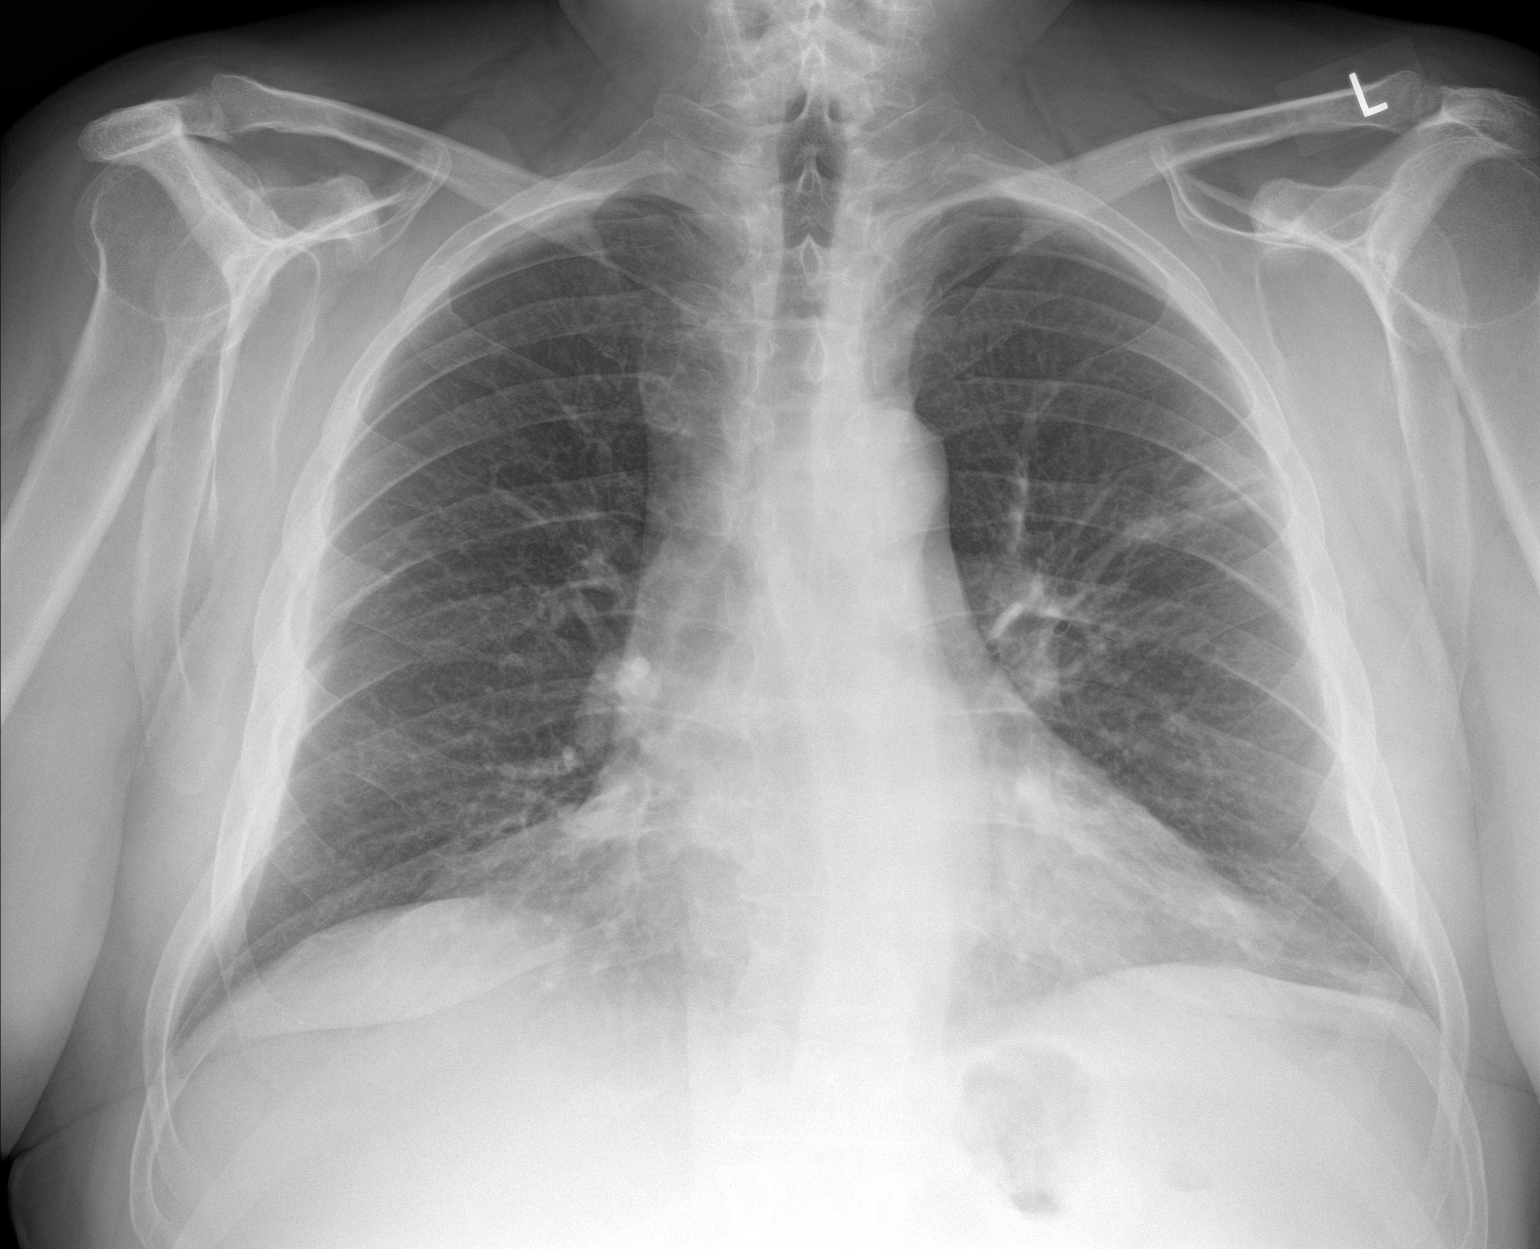

[chest lat]
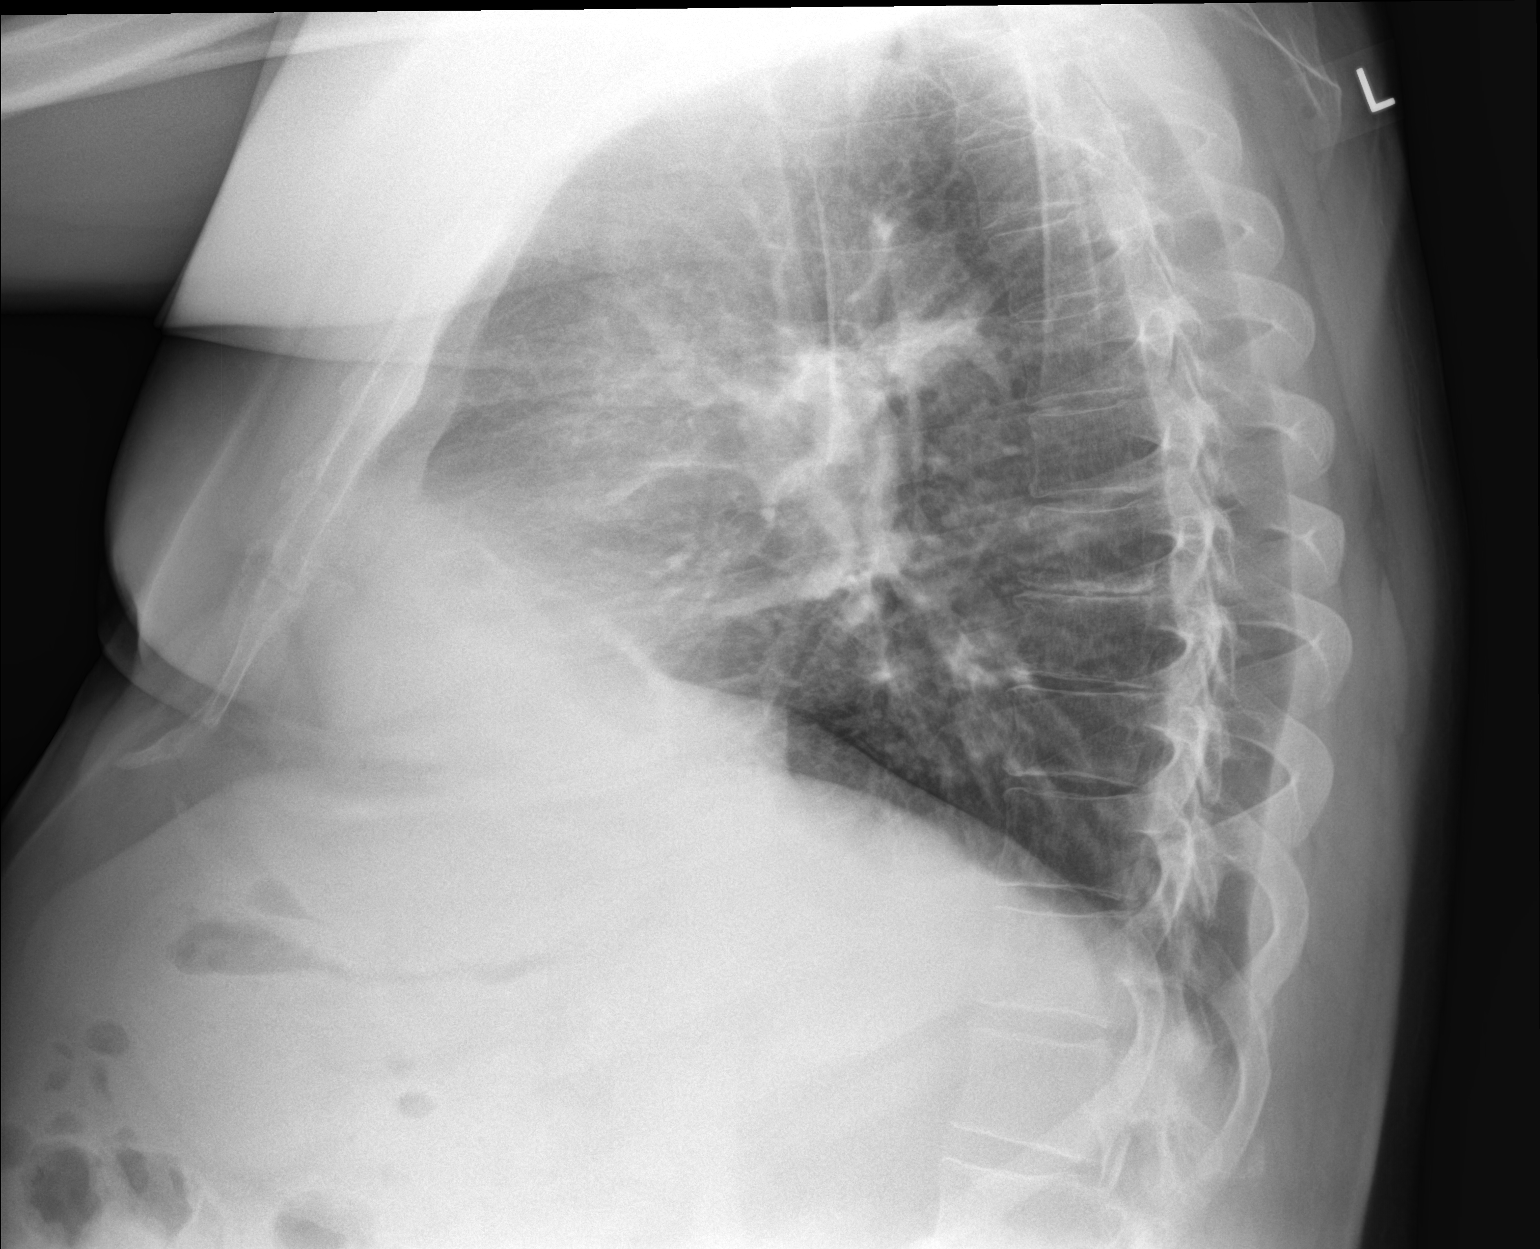

[2 of 2 positions shown; findings below may reference images not displayed]

FINDINGS: There is atelectatic change in the left upper lobe and left base
regions. Lungs elsewhere are clear. Heart size and pulmonary
vascularity are normal. No adenopathy. No bone lesions.
IMPRESSION: Areas of atelectatic change in the left upper lobe and left base
regions. Lungs elsewhere clear. Stable cardiac silhouette..

## 2019-10-14 ENCOUNTER — Encounter: Payer: Self-pay | Admitting: Cardiovascular Disease

## 2019-10-14 ENCOUNTER — Ambulatory Visit (INDEPENDENT_AMBULATORY_CARE_PROVIDER_SITE_OTHER): Payer: 59 | Admitting: Cardiovascular Disease

## 2019-10-14 ENCOUNTER — Other Ambulatory Visit: Payer: Self-pay

## 2019-10-14 VITALS — BP 110/78 | HR 62 | Ht 70.0 in | Wt 255.0 lb

## 2019-10-14 DIAGNOSIS — I10 Essential (primary) hypertension: Secondary | ICD-10-CM

## 2019-10-14 DIAGNOSIS — I712 Thoracic aortic aneurysm, without rupture, unspecified: Secondary | ICD-10-CM

## 2019-10-14 DIAGNOSIS — I119 Hypertensive heart disease without heart failure: Secondary | ICD-10-CM

## 2019-10-14 DIAGNOSIS — E7800 Pure hypercholesterolemia, unspecified: Secondary | ICD-10-CM | POA: Diagnosis not present

## 2019-10-14 LAB — HEPATIC FUNCTION PANEL
ALT: 34 IU/L (ref 0–44)
AST: 21 IU/L (ref 0–40)
Albumin: 4.2 g/dL (ref 3.8–4.8)
Alkaline Phosphatase: 80 IU/L (ref 48–121)
Bilirubin Total: 0.3 mg/dL (ref 0.0–1.2)
Bilirubin, Direct: 0.1 mg/dL (ref 0.00–0.40)
Total Protein: 6.5 g/dL (ref 6.0–8.5)

## 2019-10-14 LAB — BASIC METABOLIC PANEL
BUN/Creatinine Ratio: 28 — ABNORMAL HIGH (ref 10–24)
BUN: 26 mg/dL (ref 8–27)
CO2: 28 mmol/L (ref 20–29)
Calcium: 8.9 mg/dL (ref 8.6–10.2)
Chloride: 100 mmol/L (ref 96–106)
Creatinine, Ser: 0.92 mg/dL (ref 0.76–1.27)
GFR calc Af Amer: 103 mL/min/{1.73_m2} (ref >59)
GFR calc non Af Amer: 89 mL/min/{1.73_m2} (ref >59)
Glucose: 108 mg/dL — ABNORMAL HIGH (ref 65–99)
Potassium: 5 mmol/L (ref 3.5–5.2)
Sodium: 140 mmol/L (ref 134–144)

## 2019-10-14 LAB — LIPID PANEL
Chol/HDL Ratio: 3.6 ratio (ref 0.0–5.0)
Cholesterol, Total: 149 mg/dL (ref 100–199)
HDL: 41 mg/dL (ref >39)
LDL Chol Calc (NIH): 80 mg/dL (ref 0–99)
Triglycerides: 162 mg/dL — ABNORMAL HIGH (ref 0–149)
VLDL Cholesterol Cal: 28 mg/dL (ref 5–40)

## 2019-10-14 NOTE — Progress Notes (Signed)
Jacob Cline Date of Birth  1960/06/26       [facility]    [place] [address]  [address] [place], [place]  [address]   [place], [place]  [address] [phone]     [phone]   Fax  [phone]    Fax [phone]  Problem List: 1. Chest pain:  seen in the emergency room 2. Hypertension 3. Hyperlipidemia      I have seen Jacob Cline in the past for CP and abnormal ECG.  We have performed a cath in the past which was normal.   He also has had a stress myoview which was normal.    Jacob Cline had an episode of chest heaviness about a month ago.  Also associated with a head ache.  Was seen in the ER, work up was negative.   He has had some occasional recurent chest pain,.  Dull , shooting pain, comes and goes.   Also seems to have chest heaviness that comes on at random times ( not associated with any specific activity) which Seems to be relived with antiacids.  Has not seen his medical doctor.    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    He does not get any regular exercise.   He has taken Lovastatin in the past but stopped taking it due to  muscle aches.    Sep 10, 2013:  Jacob Cline was last seen in January, 2015. He had a stress Myoview study for recurrent episodes of chest discomfort.  The Myoview showed no evidence of ischemia. His left ventricular systolic function was normal with an EF of 57%.  Still having some CP.   He works 14 hour days ( 2 AM to 4 PM)   so has limited time to exercise.  Tries not to eat any fried foods by sometimes does not have a good option.   November 03, 2014:  Jacob Cline is doing well.  No CP.  Able to do all of his normal activities without any limitation .   Was seen by Dr. Christell Constant for pre-op visit and his ECG was found to be abnormal .    he's had T-wave inversions in the anterolateral leads for the past several years. He had a Myoview study last year which was normal. He had no evidence of  ischemia. He's done very well and has not had any cardiac symptoms.  April 23, 2018: Ms. seen back today for follow-up visit.  He was last seen 3 years ago. He has baseline T wave inversions in his lateral leads on his EKG. Is here for a check up  Has not had any particular issues.   No CP , no dyspnea. Works - drives a Multimedia programmer  Has developed leg swelling and chronic stasis changs  Still eats fast  Food  No regular exercise   November 19, 2018:  Jacob Cline is seen today for follow up for his CP and HTN Echocardiogram April 30, 2018 reveals normal left ventricular systolic function.  He has grade 2 diastolic dysfunction.  He has mild dilatation of the ascending aorta.  Doing well .   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 acetaminophen (TYLENOL) 500 MG tablet Take by mouth.    [name] albuterol (VENTOLIN HFA) 108 (90 Base) MCG/ACT inhaler Inhale 2 puffs into the lungs every 6 (six) hours as needed for wheezing or shortness of breath. 18 g 11  . aspirin 81 MG chewable tablet Chew 81 mg by mouth daily.      . cholecalciferol (VITAMIN D) 1000 UNITS tablet Take 1,000 Units by mouth daily. 3 tablets daily    . Cinnamon 500 MG capsule Take 1,000 mg by mouth daily.     . diclofenac (VOLTAREN) 75 MG EC tablet Take 1 tablet (75 mg total) by mouth 2 (two) times daily. 60 tablet 2  . fluticasone (FLONASE) 50 MCG/ACT nasal spray Place 2 sprays into both nostrils daily. 16 g 11  . Multiple Vitamin (MULTIVITAMIN) tablet Take 1 tablet by mouth daily.     . Omega-3 Fatty Acids (FISH OIL) 1000 MG CAPS Take 1 capsule by mouth daily.     [name] omeprazole (PRILOSEC) 20 MG capsule TAKE 1 CAPSULE BY MOUTH TWICE DAILY BEFORE MEAL(S) 60 capsule 11  . PARoxetine (PAXIL-CR) 25 MG  24 hr tablet Take 1 tablet (25 mg total) by mouth daily. 30 tablet 11  . pravastatin (PRAVACHOL) 40 MG tablet Take 1 tablet (40 mg total) by mouth at bedtime. 30 tablet 11  . telmisartan (MICARDIS) 40 MG tablet Take 1 tablet (40 mg total) by mouth daily. 30 tablet 6  . triamterene-hydrochlorothiazide (MAXZIDE-25) 37.5-25 MG tablet Take 1 tablet by mouth daily. 30 tablet 5  . TURMERIC PO Take 1 capsule by mouth daily.      No current facility-administered medications on file prior to visit.    Allergies  Allergen Reactions  . Other Anaphylaxis    Make me hurt alot  . Pravastatin Other (See Comments)    Stiff neck  . Codeine     Unknown   . Lovastatin     Makes me hurt alot  . Niaspan [Niacin Er]     Make me hurt alot    Past Medical History:  Diagnosis Date  . BPH (benign prostatic hypertrophy)   . Hemorrhoids   . Hiatal hernia   . Hyperlipidemia   . Hypertension   . Irregular heart beat   . Obesity   . Post-operative nausea and vomiting     Past Surgical History:  Procedure Laterality Date  . athroscopy of rt knee  02/02  . henia - umbilical surgery  0/35/00  . HERNIA REPAIR  05/15/2017   double hernia surgery  . HIP SURGERY  11/2014   right hip  . MOUTH SURGERY    . VASECTOMY      Social History   Tobacco Use  Smoking Status Never Smoker  Smokeless Tobacco Never Used    Social History   Substance and Sexual Activity  Alcohol Use No  . Alcohol/week: 0.0 standard drinks    Family History  Problem Relation Age of Onset  . Diabetes Mother   . Scleroderma Sister   . Diabetes Brother   . Diabetes Brother   . Colon cancer Other        family history   . Prostate cancer Other        family history     Reviw of Systems:  Reviewed in the HPI.  All other systems are negative.  Physical Exam: Blood pressure 110/78, pulse 62, height 5\' 10"  (1.778 m), weight 255 lb (115.7 kg), SpO2  96 %.  GEN:  Well nourished, well developed in no acute distress,  moderately obese  HEENT: Normal NECK: No JVD; No carotid bruits LYMPHATICS: No lymphadenopathy CARDIAC: RRR , no murmurs, rubs, gallops RESPIRATORY:  Clear to auscultation without rales, wheezing or rhonchi  ABDOMEN: Soft, non-tender, non-distended MUSCULOSKELETAL:  Trace edema,  + chronic stasis changes ; No deformity  SKIN: Warm and dry NEUROLOGIC:  Alert and oriented x 3   ECG: October 14, 2019: Normal sinus rhythm at 63.  Nonspecific ST and T wave changes in the inferior leads.   Assessment / Plan:    1. Chest pain:     He denies having any episodes of chest pain.  2. Hypertension -blood pressure is well controlled.  Continue current medications.  3.  Mild leg edema:   Chronic stasis changes.   4.  Obesity: Body mass index is 36.59 kg/m.  encouraged him to exercise regularly .    4. Hyperlipidemia Managed by his primary    Kristeen Miss, MD  10/14/2019 8:31 AM    [facility] [address] [place],  [address] [place], [place]  [address] Pager [phone] Phone: [phone]; Fax: [phone]

## 2019-10-14 NOTE — Patient Instructions (Signed)
Medication Instructions:  Your physician recommends that you continue on your current medications as directed. Please refer to the Current Medication list given to you today.  *If you need a refill on your cardiac medications before your next appointment, please call your pharmacy*   Lab Work: TODAY - cholesterol, liver panel, basic metabolic panel If you have labs (blood work) drawn today and your tests are completely normal, you will receive your results only by: [name] MyChart Message (if you have MyChart) OR . A paper copy in the mail If you have any lab test that is abnormal or we need to change your treatment, we will call you to review the results.   Testing/Procedures: Non-Cardiac CT Angiography (CTA), is a special type of CT scan that uses a computer to produce multi-dimensional views of major blood vessels throughout the body. In CT angiography, a contrast material is injected through an IV to help visualize the blood vessels    Follow-Up: At [facility], you and your health needs are our priority.  As part of our continuing mission to provide you with exceptional heart care, we have created designated Provider Care Teams.  These Care Teams include your primary Cardiologist (physician) and Advanced Practice Providers (APPs -  Physician Assistants and Nurse Practitioners) who all work together to provide you with the care you need, when you need it.   Your next appointment:   1 year(s)  The format for your next appointment:   In Person  Provider:   You may see Kristeen Miss, MD or one of the following Advanced Practice Providers on your designated Care Team:    Tereso Newcomer, PA-C  [place], [place]

## 2019-11-04 ENCOUNTER — Telehealth: Payer: Self-pay | Admitting: Cardiovascular Disease

## 2019-11-04 ENCOUNTER — Other Ambulatory Visit: Payer: Self-pay

## 2019-11-04 ENCOUNTER — Ambulatory Visit
Admission: RE | Admit: 2019-11-04 | Discharge: 2019-11-04 | Disposition: A | Discharge status: Home / Self Care | Payer: 59 | Source: Ambulatory Visit | Attending: Cardiovascular Disease | Admitting: Cardiovascular Disease

## 2019-11-04 ENCOUNTER — Ambulatory Visit (INDEPENDENT_AMBULATORY_CARE_PROVIDER_SITE_OTHER)
Admission: RE | Admit: 2019-11-04 | Discharge: 2019-11-04 | Disposition: A | Discharge status: Home / Self Care | Payer: 59 | Source: Ambulatory Visit | Attending: Cardiovascular Disease | Admitting: Cardiovascular Disease

## 2019-11-04 DIAGNOSIS — I119 Hypertensive heart disease without heart failure: Secondary | ICD-10-CM

## 2019-11-04 DIAGNOSIS — I712 Thoracic aortic aneurysm, without rupture, unspecified: Secondary | ICD-10-CM

## 2019-11-04 DIAGNOSIS — E7800 Pure hypercholesterolemia, unspecified: Secondary | ICD-10-CM

## 2019-11-04 DIAGNOSIS — I10 Essential (primary) hypertension: Secondary | ICD-10-CM

## 2019-11-04 MED ORDER — IOHEXOL 350 MG/ML SOLN
100.0000 mL | Freq: Once | INTRAVENOUS | Status: AC | PRN
  Administered 2019-11-04: 100 mL via INTRAVENOUS

## 2019-11-04 NOTE — Telephone Encounter (Signed)
Spoke with Elnita Maxwell from [facility] with an unexpected finding on CT Angio today.  Finding is as follows:  Indeterminate 2.4 cm low-attenuation lesion arising from the posterior aspect of the upper pole of the left kidney. Differential considerations include indolent renal neoplasm such as papillary renal cell carcinoma versus a complex (hemorrhagic/proteinaceous) cyst. Recommend further evaluation with contrast-enhanced MRI of the abdomen.   Will forward information to Dr Elease Hashimoto for further review and recommendation.

## 2019-11-04 NOTE — Telephone Encounter (Signed)
[facility]iology calling with an abnormal  CT Abdomen and Pelvis report

## 2019-11-05 ENCOUNTER — Telehealth: Payer: Self-pay | Admitting: *Deleted

## 2019-11-05 DIAGNOSIS — N2889 Other specified disorders of kidney and ureter: Secondary | ICD-10-CM

## 2019-11-05 NOTE — Telephone Encounter (Signed)
Spoke with patient and informed of CT results/findings/recommendations. Aware he will be contacted to schedule MRI abdomen for left kidney lesion.

## 2019-11-05 NOTE — Telephone Encounter (Signed)
-----   Message from Vesta Mixer, MD sent at 11/05/2019  3:26 PM EDT ----- Please inform patient about the results.   We are not sure what it is .  Radiology has recommended an MRI of the abdomen to further define the mass adjacent to the left kidney. Thanks

## 2019-11-06 ENCOUNTER — Telehealth: Payer: Self-pay | Admitting: General Practice

## 2019-11-06 NOTE — Telephone Encounter (Signed)
Pt was called and recent test were discussed. PCP was set

## 2019-11-06 NOTE — Telephone Encounter (Signed)
Pt called to set up an appointment to establish care with another provider since Dr Reginia Forts is no longer with [facility]. Pt said he wanted to establish care with Dr Dettinger. Explained to pt that I wouldn't be able to schedule him with Dr Dettinger because per Dr Louanne Skye, his patient list is fully booked and can't see anymore pt's, but that I could schedule him to establish care with any of our other providers. Pt was not happy and requested for Jamie Bullins to call him back.

## 2019-11-06 NOTE — Telephone Encounter (Signed)
Spoke with patient about scheduling an appointment with a new provider since Jacob Cline is gone.  Patient is wanting to see Dr. Louanne Cline.  I explained to the patient that Dr. Louanne Cline is not taking any new patients.  I offered patient an appointment with Dr. Nadine Cline and explained to him that she would take very good care of him and I believe he would be happy with his care with her.  He is disappointed that he cannot see Dr. Louanne Cline and still wants Jacob Cline to call him.

## 2019-11-07 ENCOUNTER — Other Ambulatory Visit: Payer: Self-pay

## 2019-11-07 DIAGNOSIS — Z01812 Encounter for preprocedural laboratory examination: Secondary | ICD-10-CM

## 2019-11-07 NOTE — Addendum Note (Signed)
Addended by: Daleen Bo I on: 11/07/2019 09:17 AM   Modules accepted: Orders

## 2019-11-18 ENCOUNTER — Other Ambulatory Visit: Payer: Self-pay

## 2019-11-18 ENCOUNTER — Other Ambulatory Visit: Payer: 59 | Admitting: *Deleted

## 2019-11-18 DIAGNOSIS — Z01812 Encounter for preprocedural laboratory examination: Secondary | ICD-10-CM

## 2019-11-18 LAB — BASIC METABOLIC PANEL
BUN/Creatinine Ratio: 30 — ABNORMAL HIGH (ref 10–24)
BUN: 29 mg/dL — ABNORMAL HIGH (ref 8–27)
CO2: 27 mmol/L (ref 20–29)
Calcium: 9.1 mg/dL (ref 8.6–10.2)
Chloride: 99 mmol/L (ref 96–106)
Creatinine, Ser: 0.98 mg/dL (ref 0.76–1.27)
GFR calc Af Amer: 96 mL/min/{1.73_m2} (ref >59)
GFR calc non Af Amer: 83 mL/min/{1.73_m2} (ref >59)
Glucose: 101 mg/dL — ABNORMAL HIGH (ref 65–99)
Potassium: 4.3 mmol/L (ref 3.5–5.2)
Sodium: 139 mmol/L (ref 134–144)

## 2019-11-25 ENCOUNTER — Other Ambulatory Visit: Payer: Self-pay

## 2019-11-25 ENCOUNTER — Ambulatory Visit (HOSPITAL_COMMUNITY)
Admission: RE | Admit: 2019-11-25 | Discharge: 2019-11-25 | Disposition: A | Discharge status: Home / Self Care | Payer: 59 | Source: Ambulatory Visit | Attending: Cardiovascular Disease | Admitting: Cardiovascular Disease

## 2019-11-25 DIAGNOSIS — N2889 Other specified disorders of kidney and ureter: Secondary | ICD-10-CM | POA: Diagnosis not present

## 2019-11-25 MED ORDER — GADOBUTROL 1 MMOL/ML IV SOLN
10.0000 mL | Freq: Once | INTRAVENOUS | Status: AC | PRN
  Administered 2019-11-25: 10 mL via INTRAVENOUS

## 2020-02-10 ENCOUNTER — Other Ambulatory Visit: Payer: Self-pay

## 2020-02-10 ENCOUNTER — Ambulatory Visit (INDEPENDENT_AMBULATORY_CARE_PROVIDER_SITE_OTHER): Payer: 59 | Admitting: Family Medicine

## 2020-02-10 VITALS — BP 114/74 | HR 61 | Temp 98.1°F | Ht 70.0 in | Wt 255.0 lb

## 2020-02-10 DIAGNOSIS — Z7689 Persons encountering health services in other specified circumstances: Secondary | ICD-10-CM

## 2020-02-10 DIAGNOSIS — N281 Cyst of kidney, acquired: Secondary | ICD-10-CM

## 2020-02-10 DIAGNOSIS — F411 Generalized anxiety disorder: Secondary | ICD-10-CM

## 2020-02-10 DIAGNOSIS — Z23 Encounter for immunization: Secondary | ICD-10-CM | POA: Diagnosis not present

## 2020-02-10 DIAGNOSIS — F339 Major depressive disorder, recurrent, unspecified: Secondary | ICD-10-CM

## 2020-02-10 DIAGNOSIS — E7800 Pure hypercholesterolemia, unspecified: Secondary | ICD-10-CM

## 2020-02-10 DIAGNOSIS — I10 Essential (primary) hypertension: Secondary | ICD-10-CM

## 2020-02-10 DIAGNOSIS — R7303 Prediabetes: Secondary | ICD-10-CM

## 2020-02-10 LAB — BAYER DCA HB A1C WAIVED: HB A1C (BAYER DCA - WAIVED): 5.9 % (ref <7.0)

## 2020-02-10 NOTE — Patient Instructions (Signed)
Biotene rinses may be helpful for the harry tongue  I will plan to order repeat MRI to look at the kidney cyst in January.

## 2020-02-10 NOTE — Progress Notes (Signed)
Subjective: CC: HTN, HLD, GAD, est care PCP: [name], DO [id number] Jacob Cline is a 59 y.o. male presenting to clinic today for:  1. HTN w/ HLD Patient is compliant with Pravachol 40 mg daily, Micardis 40 mg daily and Maxide 25 mg.  No chest pain, shortness of breath.  No edema.  No falls.  2. Depression w/ GAD Stable with Paxil CR 25 mg daily.  He does take this at the end of his day.  He has also been using Voltaren for chronic degenerative back pain.  Denies any hematochezia, melena, abdominal pain.  He is up-to-date on colonoscopies.  3.  Renal cyst He is noted to have a complex cyst on his left kidney on his 11/26/2019 MRI of the abdomen, obtained by his cardiologist.  He is established with Dr. Annabell Howells with urology but this was for testicular lesion, not for the kidney lesion.  It was recommended that he have repeat MRI in 6 months to follow this cyst.  With regards to his testicular cyst, this has been stable and not especially bothersome as of late.  4.  Prediabetes Patient reports that he snacks frequently to stay awake during work.  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  He would like to have his sugar checked as he has been in the prediabetic range with previous A1c of 6.0.    ROS: Per HPI  Allergies  Allergen Reactions  . Other Anaphylaxis    Make me hurt alot  . Pravastatin Other (See Comments)    Stiff neck  . Codeine     Unknown   . Lovastatin     Makes me hurt alot  . Niaspan [Niacin Er]     Make me hurt alot   Past Medical History:  Diagnosis Date  . BPH (benign prostatic hypertrophy)   . Hemorrhoids   . Hiatal hernia   . Hyperlipidemia   . Hypertension   . Irregular heart beat   . Obesity   . Post-operative nausea and vomiting     Current Outpatient Medications:  .  acetaminophen (TYLENOL) 500 MG tablet, Take by mouth., Disp: , Rfl:  .  albuterol (VENTOLIN HFA) 108 (90 Base) MCG/ACT inhaler, Inhale 2 puffs into the lungs every 6  (six) hours as needed for wheezing or shortness of breath., Disp: 18 g, Rfl: 11 .  aspirin 81 MG chewable tablet, Chew 81 mg by mouth daily.  , Disp: , Rfl:  .  cholecalciferol (VITAMIN D) 1000 UNITS tablet, Take 1,000 Units by mouth daily. 3 tablets daily, Disp: , Rfl:  .  Cinnamon 500 MG capsule, Take 1,000 mg by mouth daily. , Disp: , Rfl:  .  diclofenac (VOLTAREN) 75 MG EC tablet, Take 1 tablet (75 mg total) by mouth 2 (two) times daily., Disp: 60 tablet, Rfl: 2 .  fluticasone (FLONASE) 50 MCG/ACT nasal spray, Place 2 sprays into both nostrils daily., Disp: 16 g, Rfl: 11 .  Multiple Vitamin (MULTIVITAMIN) tablet, Take 1 tablet by mouth daily. , Disp: , Rfl:  .  Omega-3 Fatty Acids (FISH OIL) 1000 MG CAPS, Take 1 capsule by mouth daily. , Disp: , Rfl:  .  omeprazole (PRILOSEC) 20 MG capsule, TAKE 1 CAPSULE BY MOUTH TWICE DAILY BEFORE MEAL(S), Disp: 60 capsule, Rfl: 11 .  PARoxetine (PAXIL-CR) 25 MG 24 hr tablet, Take 1 tablet (25 mg total) by mouth daily., Disp: 30 tablet, Rfl: 11 .  pravastatin (PRAVACHOL) 40 MG tablet, Take 1  tablet (40 mg total) by mouth at bedtime., Disp: 30 tablet, Rfl: 11 .  telmisartan (MICARDIS) 40 MG tablet, Take 1 tablet (40 mg total) by mouth daily., Disp: 30 tablet, Rfl: 6 .  triamterene-hydrochlorothiazide (MAXZIDE-25) 37.5-25 MG tablet, Take 1 tablet by mouth daily., Disp: 30 tablet, Rfl: 5 .  TURMERIC PO, Take 1 capsule by mouth daily. , Disp: , Rfl:  Social History   Socioeconomic History  . Marital status: Married    Spouse name: Not on file  . Number of children: Not on file  . Years of education: Not on file  . Highest education level: Not on file  Occupational History  . Not on file  Tobacco Use  . Smoking status: Never Smoker  . Smokeless tobacco: Never Used  Vaping Use  . Vaping Use: Never used  Substance and Sexual Activity  . Alcohol use: No    Alcohol/week: 0.0 standard drinks  . Drug use: No  . Sexual activity: Not on file  Other Topics  Concern  . Not on file  Social History Narrative  . Not on file   Social Determinants of Health   Financial Resource Strain:   . Difficulty of Paying Living Expenses: Not on file  Food Insecurity:   . Worried About Programme researcher, broadcasting/film/video in the Last Year: Not on file  . Ran Out of Food in the Last Year: Not on file  Transportation Needs:   . Lack of Transportation (Medical): Not on file  . Lack of Transportation (Non-Medical): Not on file  Physical Activity:   . Days of Exercise per Week: Not on file  . Minutes of Exercise per Session: Not on file  Stress:   . Feeling of Stress : Not on file  Social Connections:   . Frequency of Communication with Friends and Family: Not on file  . Frequency of Social Gatherings with Friends and Family: Not on file  . Attends Religious Services: Not on file  . Active Member of Clubs or Organizations: Not on file  . Attends Banker Meetings: Not on file  . Marital Status: Not on file  Intimate Partner Violence:   . Fear of Current or Ex-Partner: Not on file  . Emotionally Abused: Not on file  . Physically Abused: Not on file  . Sexually Abused: Not on file   Family History  Problem Relation Age of Onset  . Diabetes Mother   . Scleroderma Sister   . Diabetes Brother   . Diabetes Brother   . Colon cancer Other        family history   . Prostate cancer Other        family history     Objective: Office vital signs reviewed. BP 114/74   Pulse 61   Temp 98.1 F (36.7 C) (Temporal)   Ht 5\' 10"  (1.778 m)   Wt 255 lb (115.7 kg)   SpO2 97%   BMI 36.59 kg/m   Physical Examination:  General: Awake, alert, obese, No acute distress HEENT: Normal, sclera white, MMM. No carotid bruits Cardio: regular rate and rhythm, S1S2 heard, no murmurs appreciated Pulm: clear to auscultation bilaterally, no wheezes, rhonchi or rales; normal work of breathing on room air Extremities: warm, well perfused, No edema, cyanosis or clubbing; +2  pulses bilaterally MSK: normal gait and station Skin: Mildly scale noted along the right helix. Psych: Mood stable, speech normal, affect appropriate, pleasant and interactive Depression screen [facility] 2/9 02/10/2020 08/12/2019 06/10/2019  Decreased  Interest 0 0 0  Down, Depressed, Hopeless 0 0 0  PHQ - 2 Score 0 0 0  Altered sleeping 0 0 -  Tired, decreased energy 0 0 -  Change in appetite 0 0 -  Feeling bad or failure about yourself  0 0 -  Trouble concentrating 0 0 -  Moving slowly or fidgety/restless 0 0 -  Suicidal thoughts 0 0 -  PHQ-9 Score 0 0 -     Assessment/ Plan: 59 y.o. male   1. Essential hypertension Controlled.  I reviewed his last echocardiogram from 04/27/2018 which showed grade 2 diastolic dysfunction and mild aortic dilatation.  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  We discussed the risks of undiagnosed sleep apnea and he voiced good understanding.  He would like to just simply continue following up with his cardiologist  2. Pure hypercholesterolemia Continue statin  3. Depression, recurrent (HCC) Stable with Paxil.  Discussed GI bleeding risk of SSRIs and NSAIDs.  4. GAD (generalized anxiety disorder) Stable  5. Establishing care with new doctor, encounter for  6. Pre-diabetes Check A1c - Bayer DCA Hb A1c Waived  7. Renal cyst We will plan for MRI in January 2022.  He will follow-up with me in January so this can be ordered.  May need to consider referral back to Dr. Annabell Howells  8. Need for immunization against influenza Administered on today's visit - Flu Vaccine QUAD 36+ mos IM   No orders of the defined types were placed in this encounter.  No orders of the defined types were placed in this encounter.    [name], DO [facility] [phone]

## 2020-03-17 ENCOUNTER — Telehealth: Payer: Self-pay

## 2020-03-17 ENCOUNTER — Other Ambulatory Visit: Payer: Self-pay | Admitting: *Deleted

## 2020-03-17 DIAGNOSIS — M7741 Metatarsalgia, right foot: Secondary | ICD-10-CM

## 2020-03-17 MED ORDER — DICLOFENAC SODIUM 75 MG PO TBEC: 75.0000 mg | DELAYED_RELEASE_TABLET | Freq: Two times a day (BID) | ORAL | 2 refills | Status: DC

## 2020-03-17 NOTE — Telephone Encounter (Signed)
Is likely well protected from having previous to shot vaccinations and natural immunity through COVID-19.  The antibodies naturally decline over time, hence the new recommendation for booster shots in certain persons.  H if he would like to proceed with a booster he is technically able to do so.

## 2020-03-23 ENCOUNTER — Other Ambulatory Visit: Payer: Self-pay | Admitting: *Deleted

## 2020-03-23 DIAGNOSIS — M7741 Metatarsalgia, right foot: Secondary | ICD-10-CM

## 2020-03-23 DIAGNOSIS — M7742 Metatarsalgia, left foot: Secondary | ICD-10-CM

## 2020-03-31 ENCOUNTER — Other Ambulatory Visit: Payer: Self-pay | Admitting: *Deleted

## 2020-03-31 DIAGNOSIS — I10 Essential (primary) hypertension: Secondary | ICD-10-CM

## 2020-03-31 MED ORDER — TRIAMTERENE-HCTZ 37.5-25 MG PO TABS: 1.0000 | ORAL_TABLET | Freq: Every day | ORAL | 5 refills | Status: DC

## 2020-05-18 ENCOUNTER — Ambulatory Visit: Payer: 59 | Admitting: Family Medicine

## 2020-05-25 ENCOUNTER — Ambulatory Visit: Payer: 59 | Admitting: Family Medicine

## 2020-06-15 ENCOUNTER — Other Ambulatory Visit: Payer: Self-pay | Admitting: *Deleted

## 2020-06-15 DIAGNOSIS — I10 Essential (primary) hypertension: Secondary | ICD-10-CM

## 2020-06-15 MED ORDER — TELMISARTAN 40 MG PO TABS: 40.0000 mg | ORAL_TABLET | Freq: Every day | ORAL | 0 refills | Status: DC

## 2020-07-13 ENCOUNTER — Other Ambulatory Visit: Payer: Self-pay

## 2020-07-13 ENCOUNTER — Ambulatory Visit (INDEPENDENT_AMBULATORY_CARE_PROVIDER_SITE_OTHER): Payer: 59 | Admitting: Family Medicine

## 2020-07-13 ENCOUNTER — Encounter: Payer: Self-pay | Admitting: Family Medicine

## 2020-07-13 VITALS — BP 98/68 | HR 66 | Temp 97.3°F | Ht 70.0 in | Wt 254.0 lb

## 2020-07-13 DIAGNOSIS — L720 Epidermal cyst: Secondary | ICD-10-CM | POA: Insufficient documentation

## 2020-07-13 DIAGNOSIS — R0683 Snoring: Secondary | ICD-10-CM

## 2020-07-13 DIAGNOSIS — I10 Essential (primary) hypertension: Secondary | ICD-10-CM | POA: Diagnosis not present

## 2020-07-13 DIAGNOSIS — N281 Cyst of kidney, acquired: Secondary | ICD-10-CM | POA: Diagnosis not present

## 2020-07-13 DIAGNOSIS — K148 Other diseases of tongue: Secondary | ICD-10-CM

## 2020-07-13 DIAGNOSIS — B351 Tinea unguium: Secondary | ICD-10-CM

## 2020-07-13 DIAGNOSIS — L600 Ingrowing nail: Secondary | ICD-10-CM

## 2020-07-13 DIAGNOSIS — E7800 Pure hypercholesterolemia, unspecified: Secondary | ICD-10-CM

## 2020-07-13 NOTE — Progress Notes (Signed)
Subjective: CC:f/u renal cyst PCP: Jacob Ip, DO ONG:EXBMW Jacob Cline is a 60 y.o. male presenting to clinic today for:  1. Renal cyst Urine output is good.  Here for interval checkup so that we may order an MRI of his kidney.  He asks if we can coordinate his aortic imaging study.  However, he is not due until June  2.  Cyst Patient reports he has a cyst on his chest.  This has been monitored intermittently by his previous PCP and he wanted to bring my attention to it  3.  Toenail issue Patient reports ingrown toenails bilaterally.  He has some tenderness along the medial aspect of the right great toenail.  He has history of onychomycosis.  Not seeing a podiatrist nor doing anything special for the nails  4.  Concern for sleep apnea Patient has an elevated stop bang score.  He had a DOT physical recently and they wanted to have him evaluated for sleep apnea.  He is asking for referral for this today.  Snoring is present.  Does not report any excessive daytime sleepiness  5.  Hairy tongue Hairy tongue has improved with use of Biotene.  He gets a little sore at the tip of the tongue intermittently he wants to make sure this is okay  6.  Hypertension with hyperlipidemia Patient is compliant with all his medications.  No chest pain, shortness of breath, edema or falls.  He voices concern about how "close he is to going into a nursing home".  He has been trying to clean up his diet but is only lost 1 pound since his last checkup.  He is not as physically active as he would like to be.   ROS: Per HPI  Allergies  Allergen Reactions  . Other Anaphylaxis    Make me hurt alot  . Pravastatin Other (See Comments)    Stiff neck  . Codeine     Unknown   . Lovastatin     Makes me hurt alot  . Niaspan [Niacin Er]     Make me hurt alot   Past Medical History:  Diagnosis Date  . BPH (benign prostatic hypertrophy)   . Hemorrhoids   . Hiatal hernia   . Hyperlipidemia   .  Hypertension   . Irregular heart beat   . Obesity   . Post-operative nausea and vomiting     Current Outpatient Medications:  .  acetaminophen (TYLENOL) 500 MG tablet, Take by mouth., Disp: , Rfl:  .  albuterol (VENTOLIN HFA) 108 (90 Base) MCG/ACT inhaler, Inhale 2 puffs into the lungs every 6 (six) hours as needed for wheezing or shortness of breath., Disp: 18 g, Rfl: 11 .  aspirin 81 MG chewable tablet, Chew 81 mg by mouth daily.  , Disp: , Rfl:  .  cholecalciferol (VITAMIN D) 1000 UNITS tablet, Take 1,000 Units by mouth daily. 3 tablets daily, Disp: , Rfl:  .  Cinnamon 500 MG capsule, Take 1,000 mg by mouth daily. , Disp: , Rfl:  .  diclofenac (VOLTAREN) 75 MG EC tablet, Take 1 tablet (75 mg total) by mouth 2 (two) times daily., Disp: 60 tablet, Rfl: 2 .  fluticasone (FLONASE) 50 MCG/ACT nasal spray, Place 2 sprays into both nostrils daily., Disp: 16 g, Rfl: 11 .  Multiple Vitamin (MULTIVITAMIN) tablet, Take 1 tablet by mouth daily. , Disp: , Rfl:  .  Omega-3 Fatty Acids (FISH OIL) 1000 MG CAPS, Take 1 capsule by mouth daily. ,  Disp: , Rfl:  .  omeprazole (PRILOSEC) 20 MG capsule, TAKE 1 CAPSULE BY MOUTH TWICE DAILY BEFORE MEAL(S), Disp: 60 capsule, Rfl: 11 .  PARoxetine (PAXIL-CR) 25 MG 24 hr tablet, Take 1 tablet (25 mg total) by mouth daily., Disp: 30 tablet, Rfl: 11 .  pravastatin (PRAVACHOL) 40 MG tablet, Take 1 tablet (40 mg total) by mouth at bedtime., Disp: 30 tablet, Rfl: 11 .  telmisartan (MICARDIS) 40 MG tablet, Take 1 tablet (40 mg total) by mouth daily., Disp: 30 tablet, Rfl: 0 .  triamterene-hydrochlorothiazide (MAXZIDE-25) 37.5-25 MG tablet, Take 1 tablet by mouth daily., Disp: 30 tablet, Rfl: 5 .  TURMERIC PO, Take 1 capsule by mouth daily. , Disp: , Rfl:  Social History   Socioeconomic History  . Marital status: Married    Spouse name: Not on file  . Number of children: Not on file  . Years of education: Not on file  . Highest education level: Not on file   Occupational History  . Not on file  Tobacco Use  . Smoking status: Never Smoker  . Smokeless tobacco: Never Used  Vaping Use  . Vaping Use: Never used  Substance and Sexual Activity  . Alcohol use: No    Alcohol/week: 0.0 standard drinks  . Drug use: No  . Sexual activity: Not on file  Other Topics Concern  . Not on file  Social History Narrative  . Not on file   Social Determinants of Health   Financial Resource Strain: Not on file  Food Insecurity: Not on file  Transportation Needs: Not on file  Physical Activity: Not on file  Stress: Not on file  Social Connections: Not on file  Intimate Partner Violence: Not on file   Family History  Problem Relation Age of Onset  . Diabetes Mother   . Scleroderma Sister   . Diabetes Brother   . Diabetes Brother   . Colon cancer Other        family history   . Prostate cancer Other        family history     Objective: Office vital signs reviewed. There were no vitals taken for this visit.  Physical Examination:  General: Awake, alert, obese, No acute distress HEENT: Normal; no carotid bruits; tongue with mildly inflamed taste bud of the right tip of tongue. Cardio: regular rate and rhythm, S1S2 heard, no murmurs appreciated Pulm: clear to auscultation bilaterally, no wheezes, rhonchi or rales; normal work of breathing on room air GI: Protuberant Extremities: warm, well perfused, No edema, cyanosis or clubbing; +2 pulses bilaterally MSK: normal gait and station Skin: Well-circumscribed, mobile, soft tissue masses approximately kidney being incised along the right lower chest medially.  Onychomycotic changes noted to bilateral feet.  The medial right great toenail with callus formation.  No erythema, induration or purulence.  Assessment/ Plan: 60 y.o. male   Acquired cyst of kidney - Plan: MR Abdomen W Wo Contrast  Snoring - Plan: Ambulatory referral to Sleep Studies  Morbid obesity (HCC) - Plan: Ambulatory referral to  Sleep Studies  Essential hypertension  Pure hypercholesterolemia  Tongue discoloration  Epidermoid cyst of skin of chest  Ingrown toenail of both feet  Onychomycosis of toenail  Check MRI with/without contrast to follow-up on renal cyst Referral to sleep studies has been placed for likely sleep apnea.  We discussed likely need for split-night study  Blood pressures controlled.  In fact, would consider reducing Micardis dose.  He is currently asymptomatic.  Encourage hydration.  Recheck in 1 to 2 weeks  Continue statin.  We discussed improving physical activity such that he may reduce his cardiac risk.  I recommend him reaching out to cardiology to coordinate CT angiogram but again this is not due until June and I would like him to have the MRI of his cyst sooner than June.  Tongue discoloration has resolved.  He has an inflamed taste bud.  Cyst of skin noted.  No apparent complications.  Continue to monitor  Home care instructions for ingrown toenail discussed.  No evidence of infection.  Offered referral to podiatry to have these removed bilaterally.  He wants to hold off on that   Orders Placed This Encounter  Procedures  . MR Abdomen W Wo Contrast    Standing Status:   Future    Standing Expiration Date:   07/13/2021    Order Specific Question:   If indicated for the ordered procedure, I authorize the administration of contrast media per Radiology protocol    Answer:   Yes    Order Specific Question:   What is the patient's sedation requirement?    Answer:   No Sedation    Order Specific Question:   Does the patient have a pacemaker or implanted devices?    Answer:   No    Order Specific Question:   Preferred imaging location?    Answer:   Internal  . Ambulatory referral to Sleep Studies    Referral Priority:   Routine    Referral Type:   Consultation    Referral Reason:   Specialty Services Required    Number of Visits Requested:   1   No orders of the defined types  were placed in this encounter.    Jacob Ip, DO [facility] [phone]

## 2020-07-13 NOTE — Patient Instructions (Signed)
Your heart doctor performs a CT Angiogram for your Aorta. You could see if he can coordinate this test to be done during the same appointment as your MRI, but the MRI for your kidneys is different than what you need for your Aorta  Referral to sleep medicine placed  For ingrown toenail: Soak your foot 3 times daily, for at least 15 minutes each time, in warm water mixed with Epsom salt.  Peel the skin near the ingrown nail back gently with each soak.  This will help the nail grow out.  You may take Ibuprofen for discomfort.  If you develop fevers, chills, worsening pain, worsening redness or swelling, please seek medical attention.  Remember to cut your nails straight across.  Avoid cutting them at an angle, as this will increase your risk of developing an ingrown nail.  If there is no improvement in your discomfort in the next 2 weeks, please call to schedule a toenail removal.

## 2020-07-17 ENCOUNTER — Other Ambulatory Visit: Payer: Self-pay | Admitting: *Deleted

## 2020-07-17 DIAGNOSIS — I10 Essential (primary) hypertension: Secondary | ICD-10-CM

## 2020-07-17 MED ORDER — TELMISARTAN 40 MG PO TABS: 40.0000 mg | ORAL_TABLET | Freq: Every day | ORAL | 5 refills | Status: DC

## 2020-07-24 ENCOUNTER — Ambulatory Visit (HOSPITAL_COMMUNITY): Payer: 59

## 2020-07-27 ENCOUNTER — Other Ambulatory Visit: Payer: Self-pay

## 2020-07-27 ENCOUNTER — Ambulatory Visit (HOSPITAL_COMMUNITY)
Admission: RE | Admit: 2020-07-27 | Discharge: 2020-07-27 | Disposition: A | Discharge status: Home / Self Care | Payer: 59 | Source: Ambulatory Visit | Attending: Family Medicine | Admitting: Family Medicine

## 2020-07-27 DIAGNOSIS — N281 Cyst of kidney, acquired: Secondary | ICD-10-CM | POA: Insufficient documentation

## 2020-07-27 MED ORDER — GADOBUTROL 1 MMOL/ML IV SOLN
10.0000 mL | Freq: Once | INTRAVENOUS | Status: AC | PRN
  Administered 2020-07-27: 10 mL via INTRAVENOUS

## 2020-08-17 ENCOUNTER — Ambulatory Visit (INDEPENDENT_AMBULATORY_CARE_PROVIDER_SITE_OTHER): Payer: 59 | Admitting: Neurology

## 2020-08-17 ENCOUNTER — Encounter: Payer: Self-pay | Admitting: Neurology

## 2020-08-17 VITALS — BP 125/76 | HR 62 | Ht 70.0 in | Wt 258.0 lb

## 2020-08-17 DIAGNOSIS — I712 Thoracic aortic aneurysm, without rupture, unspecified: Secondary | ICD-10-CM

## 2020-08-17 DIAGNOSIS — F339 Major depressive disorder, recurrent, unspecified: Secondary | ICD-10-CM

## 2020-08-17 DIAGNOSIS — R351 Nocturia: Secondary | ICD-10-CM

## 2020-08-17 DIAGNOSIS — Z0289 Encounter for other administrative examinations: Secondary | ICD-10-CM

## 2020-08-17 DIAGNOSIS — R0683 Snoring: Secondary | ICD-10-CM | POA: Diagnosis not present

## 2020-08-17 NOTE — Patient Instructions (Signed)

## 2020-08-17 NOTE — Progress Notes (Signed)
SLEEP MEDICINE CLINIC    Provider:  Melvyn Novas, MD  Primary Care Physician:  [name], DO [address] [place] [address]     Referring Provider: Jeronimo Greaves [address] [place],  [place] [address]          Chief Complaint according to patient   Patient presents with:    . 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 HTN and hypercholesterolemia       HISTORY OF PRESENT ILLNESS:  Jacob Cline is a 67. year -old  Caucasian male patient seen here as a referral on 08/17/2020 from DOT/ Dr Nadine Counts. Chief concern according to patient :  see above    Jacob Cline  has a past medical history of BPH (benign prostatic hypertrophy), Hemorrhoids, Hiatal hernia, Hyperlipidemia, Hypertension, Irregular heart beat, Obesity, and Post-operative nausea and vomiting.     Sleep relevant medical history: Nocturia 2-3, cervical DDD.    Family medical /sleep history: NO other family member on CPAP with OSA, insomnia, sleep walkers. brother has AAA.   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  Tobacco use  Never-.  ETOH use: 3-4 week max. Caffeine intake in form of Coffee( 2 ) Soda( 2 at night )  Regular exercise ; none.  Hobbies : few.    Sleep habits are as follows: The patient's dinner time is packed - 2-3 AM  - The patient goes to bed at [facility] and continues to sleep for 6 hours, wakes for 2-3 bathroom breaks.  Sleeps a longer time on weekends.  The preferred sleep position is sideways - and snores loudly- , with the support of 1 pillow.. Dreams are reportedly infrequent.  10 PM is the usual rise time. The patient wakes up with an alarm.  He reports not  feeling fully refreshed or restored in AM, with symptoms such as dry mouth, morning headaches, and residual fatigue.  Naps are taken frequently.     Review of Systems: Out of a complete 14 system review, the patient complains of only the following symptoms, and all other reviewed systems are negative.:  Fatigue, sleepiness , loud snoring, fragmented sleep, nocturia- night shift worker.    How likely are you to doze in the following situations: 0 = not likely, 1 = slight chance, 2 = moderate chance, 3 = high chance   Sitting and Reading? Watching Television? Sitting inactive in a public place (theater or meeting)? As a passenger in a car for an hour without a break? Lying down in the afternoon when circumstances permit? Sitting and talking to someone? Sitting quietly after lunch without alcohol? In a car, while stopped for a few minutes in traffic?   Total = 4/ 24 points   FSS endorsed at 30/ 63 points.   Social History   Socioeconomic History  . Marital status: Married    Spouse name: Not on file  . Number of children: Not on file  . Years of education: Not on file  . Highest education level: Not on file  Occupational History  . Occupation: Full time  Tobacco Use  .  Smoking status: Never Smoker  . Smokeless tobacco: Never Used  Vaping Use  . Vaping Use: Never used  Substance and Sexual Activity  . Alcohol use: No    Alcohol/week: 0.0 standard drinks  . Drug use: No  . Sexual activity: Not on file  Other Topics Concern  . Not on file  Social History Narrative  . Not on file   Social Determinants of Health   Financial Resource Strain: Not on file  Food Insecurity: Not on file  Transportation Needs: Not on file  Physical Activity: Not on file  Stress: Not on file  Social Connections: Not on file    Family History  Problem Relation Age of Onset  . Diabetes Mother   . Scleroderma Sister   . Diabetes Brother   . Diabetes Brother   . Colon cancer Other         family history   . Prostate cancer Other        family history     Past Medical History:  Diagnosis Date  . BPH (benign prostatic hypertrophy)   . Hemorrhoids   . Hiatal hernia   . Hyperlipidemia   . Hypertension   . Irregular heart beat   . Obesity   . Post-operative nausea and vomiting     Past Surgical History:  Procedure Laterality Date  . athroscopy of rt knee  02/02  . henia - umbilical surgery  07/17/07  . HERNIA REPAIR  05/15/2017   double hernia surgery  . HIP SURGERY  11/2014   right hip  . MOUTH SURGERY    . VASECTOMY       Current Outpatient Medications on File Prior to Visit  Medication Sig Dispense Refill  . acetaminophen (TYLENOL) 500 MG tablet Take by mouth.    [name] aspirin 81 MG chewable tablet Chew 81 mg by mouth daily.    . cholecalciferol (VITAMIN D) 1000 UNITS tablet Take 1,000 Units by mouth daily. 3 tablets daily    . Cinnamon 500 MG capsule Take 1,000 mg by mouth daily.     . diclofenac (VOLTAREN) 75 MG EC tablet Take 1 tablet (75 mg total) by mouth 2 (two) times daily. 60 tablet 2  . Multiple Vitamin (MULTIVITAMIN) tablet Take 1 tablet by mouth daily.     . Omega-3 Fatty Acids (FISH OIL) 1000 MG CAPS Take 1 capsule by mouth daily.     [name] omeprazole (PRILOSEC) 20 MG capsule TAKE 1 CAPSULE BY MOUTH TWICE DAILY BEFORE MEAL(S) 60 capsule 11  . pravastatin (PRAVACHOL) 40 MG tablet Take 1 tablet (40 mg total) by mouth at bedtime. 30 tablet 11  . telmisartan (MICARDIS) 40 MG tablet Take 1 tablet (40 mg total) by mouth daily. 30 tablet 5  . triamterene-hydrochlorothiazide (MAXZIDE-25) 37.5-25 MG tablet Take 1 tablet by mouth daily. 30 tablet 5  . TURMERIC PO Take 1 capsule by mouth daily.     [name] albuterol (VENTOLIN HFA) 108 (90 Base) MCG/ACT inhaler Inhale 2 puffs into the lungs every 6 (six) hours as needed for wheezing or shortness of breath. 18 g 11  . fluticasone (FLONASE) 50 MCG/ACT nasal spray Place 2 sprays into both nostrils daily. 16 g 11  .  PARoxetine (PAXIL-CR) 25 MG 24 hr tablet Take 1 tablet (25 mg total) by mouth daily. 30 tablet 11   No current facility-administered medications on file prior to visit.    Allergies  Allergen Reactions  . Other Anaphylaxis    Make me hurt  alot  . Pravastatin Other (See Comments)    Stiff neck  . Codeine     Unknown   . Lovastatin     Makes me hurt alot  . Niaspan [Niacin Er]     Make me hurt alot    Physical exam:  Today's Vitals   08/17/20 0857  BP: 125/76  Pulse: 62  Weight: 258 lb (117 kg)  Height: 5\' 10"  (1.778 m)   Body mass index is 37.02 kg/m.   Wt Readings from Last 3 Encounters:  08/17/20 258 lb (117 kg)  07/13/20 254 lb (115.2 kg)  02/10/20 255 lb (115.7 kg)     Ht Readings from Last 3 Encounters:  08/17/20 5\' 10"  (1.778 m)  07/13/20 5\' 10"  (1.778 m)  02/10/20 5\' 10"  (1.778 m)      General: The patient is awake, alert and appears not in acute distress. The patient is  groomed. Head: Normocephalic, atraumatic. Neck is supple.  Mallampati 2  neck circumference:18 inches .  Nasal airflow patent.   Retrognathia is not  seen.  Facial hair.  Dental status:  Cardiovascular:  Regular rate and cardiac rhythm by pulse,  without distended neck veins. Respiratory: Lungs are clear to auscultation.  Skin:  With evidence of ankle edema, no rash. Trunk: The patient's posture is erect.   Neurologic exam : The patient is awake and alert, oriented to place and time.   Memory subjective described as intact.  Attention span & concentration ability appears normal.  Speech is fluent,  without  dysarthria, dysphonia or aphasia.  Mood and affect are appropriate.   Cranial nerves: no loss of smell or taste reported  Pupils are equal and briskly reactive to light. Funduscopic exam deferred. No cataracts.   Extraocular movements in vertical and horizontal planes were intact and without nystagmus. No Diplopia. Visual fields by finger perimetry are intact. Hearing was  intact to soft voice and finger rubbing. Facial sensation intact to fine touch.  Facial motor strength is symmetric and tongue and uvula move midline.  Neck ROM : rotation, tilt and flexion extension were normal for age and shoulder shrug was symmetrical.    Motor exam:  Symmetric bulk, tone and ROM.   Normal tone without cog wheeling, symmetric grip strength .   Sensory:  Fine touch, pinprick and vibration were normal.  Proprioception tested in the upper extremities was normal.   Coordination: Rapid alternating movements in the fingers/hands were of normal speed.  The Finger-to-nose maneuver was intact without evidence of ataxia, dysmetria or tremor.   Gait and station: Patient could rise unassisted from a seated position, walked without assistive device.  Stance is of normal width/ base and the patient turned with 3 steps.  Toe and heel walk were deferred.  Deep tendon reflexes: in the  upper and lower extremities are symmetric and intact.  Babinski response was deferred.        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  On the days he does not drive he makes up with extra nap time, usually he will get about 6  hours of sleep and daytime.  Sleep can be fragmented by nocturia 2-3 times he has changed his fluid intake and has shifted away from TV.  He may drink 2-4 carbonated or caffeinated beverages a day.  His BMI is elevated at 37, he does have a moderate upper airway obstruction, dental status is intact, no crowding he does have a bridge on the left lower side no dentures.  He  reports some swelling in the ankles and this is most likely attributable to Norvasc-amlodipine.  He is also on a cholesterol-lowering statin.  His markers are such that he is at a higher risk of having obstructive sleep apnea and his wife has told him that he snores and rather loudly.  He is treated for GERD so I do not think that acid reflux creates any apnea in him.  I will order a home sleep test for him and will Maczis as urgent I will ask the technologist also to max the study as urgent for DOT.  We will follow up after the study either by recommending treatment first or via phone conference discussing which treatment the patient would prefer if several options are available.    My Plan is to proceed with:  1) I will order a home sleep test for him and will Maczis as urgent I will ask the technologist also to max the study as urgent for DOT.  We will follow up after the study either by recommending treatment first or via phone conference discussing which treatment the patient would prefer if several options are available 2) 3)   I would like to thank [name], DO and [name], Do [address] [place],  [place] [address] for allowing me to meet with and to take care of this pleasant patient.     I plan to follow up either personally or through our NP within 2-4 month.   CC: I will share my notes with PCP> .  Electronically signed by: Melvyn Novas, MD 08/17/2020 9:05 AM  [facility] Board certified by The [employer] of Sleep Medicine and Diplomate of the [employer] of Sleep Medicine. Board certified In Neurology through the ABPN, Fellow of the [employer] of Neurology. Medical Director of [facility].

## 2020-08-18 ENCOUNTER — Other Ambulatory Visit: Payer: Self-pay | Admitting: Family Medicine

## 2020-08-18 DIAGNOSIS — M7742 Metatarsalgia, left foot: Secondary | ICD-10-CM

## 2020-08-18 DIAGNOSIS — M7741 Metatarsalgia, right foot: Secondary | ICD-10-CM

## 2020-08-28 ENCOUNTER — Ambulatory Visit (INDEPENDENT_AMBULATORY_CARE_PROVIDER_SITE_OTHER): Payer: 59 | Admitting: Neurology

## 2020-08-28 DIAGNOSIS — G4733 Obstructive sleep apnea (adult) (pediatric): Secondary | ICD-10-CM | POA: Diagnosis not present

## 2020-08-28 DIAGNOSIS — Z0289 Encounter for other administrative examinations: Secondary | ICD-10-CM

## 2020-08-28 DIAGNOSIS — R0683 Snoring: Secondary | ICD-10-CM

## 2020-08-28 DIAGNOSIS — R351 Nocturia: Secondary | ICD-10-CM

## 2020-08-28 DIAGNOSIS — I712 Thoracic aortic aneurysm, without rupture, unspecified: Secondary | ICD-10-CM

## 2020-08-28 DIAGNOSIS — G4734 Idiopathic sleep related nonobstructive alveolar hypoventilation: Secondary | ICD-10-CM

## 2020-08-31 DIAGNOSIS — G4733 Obstructive sleep apnea (adult) (pediatric): Secondary | ICD-10-CM | POA: Insufficient documentation

## 2020-08-31 NOTE — Progress Notes (Signed)
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 He was asked to undergo a sleep test due to having high risk factors for OSA.   Epworth sleepiness score: 4/24. BMI: 36.9 kg/m Neck Circumference: 18 "  FINDINGS:   Total Record Time (hours, min): 7 h 5 min  Total Sleep Time (hours, min):  6 h 34 min   Percent REM (%):    Not enough REM detected   Calculated pAHI (per hour): 53.7        REM pAHI: N/A   NREM pAHI: N/A Supine AHI: 56.9   Oxygen Saturation (%) Mean: 88  Minimum oxygen saturation (%):        57   O2 Saturation Range (%): 57-99  O2Saturation (minutes) <=88%: 58.6 min  Pulse Mean (bpm):    61  Pulse Range (47-103)   IMPRESSION: This HST indicated the presence of severe OSA (obstructive sleep apnea) with an AHI of 53.7/h and multiple brief oxygen desaturations were documented.  62.4 minutes, or 15.8% of total sleep time, were in hypoxemia under 90% saturation and Nadir was at 57%. No REM/ NREM differentiation was possible.   RECOMMENDATION:  This severe form of sleep apnea is accompanied by desoxygenation and would require PAP therapy. We will urgently order a CPAP auto set for 6-18 cm water, 2 cm EPR and heated humidity with mask of choice, Follow up with ONO.    INTERPRETING PHYSICIAN:   Melvyn Novas, MD 08/31/2020   [facility] Board certified by The [employer] of Sleep Medicine and Diplomate of the [employer] of Sleep Medicine. Board certified In Neurology through the ABPN, Fellow of the [employer] of Neurology. Medical Director of [facility].    Sleep Summary  Oxygen Saturation Statistics   Start Study Time: End Study Time: Total Recording Time: 3:05:34 AM     10:11:07 AM          7 h, 5 min  Total Sleep Time Inconclusive REM Detection 6 h, 34 min    Mean: 88 Minimum: 57 Maximum: 99  Mean of Desaturations Nadirs (%):   83  Oxygen Desatur. %:  4-9 10-20 >20 Total  Events Number Total   94  23 71.8 17.6  14 10.7  131 100.0  Oxygen Saturation: <90 <=88 <85 <80 <70  Duration (minutes): Sleep % 62.4 15.8 58.6 51.5 14.9 13.1 38.2 9.7 6.2 1.6     Respiratory Indices      Total Events REM NREM All Night  pRDI: pAHI 3%: ODI 4%: pAHIc 3%: % CSR: pAHI 4%:  187  187  131  6 0.0 146 N/A N/A N/A N/A N/A N/A N/A N/A 53.7 53.7 37.6 1.7 41.9       Pulse Rate Statistics during Sleep (BPM)      Mean: 61 Minimum: 47 Maximum: 103         Position Supine Prone Right Left Non-Supine  Sleep (min) 234.5 0.0 159.5 0.0 159.5  Sleep % 59.5 0.0 40.5 0.0 40.5  pRDI 56.9 N/A 49.3 N/A 49.3  pAHI 3% 56.9 N/A 49.3 N/A 49.3  ODI 4% 41.4 N/A 32.4 N/A 32.4     Snoring Statistics Snoring Level (dB) >40 >50 >60 >70 >80 >Threshold (45)  Sleep (min) 54.2 5.7 2.0 0.0 0.0 13.4  Sleep % 13.8 1.4 0.5 0.0 0.0 3.4    Mean: 41 dB

## 2020-08-31 NOTE — Addendum Note (Signed)
Addended by: Melvyn Novas on: 08/31/2020 01:02 PM   Modules accepted: Orders

## 2020-08-31 NOTE — Procedures (Signed)
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 He was asked to undergo a sleep test due to having high risk factors for OSA.   Epworth sleepiness score: 4/24. BMI: 36.9 kg/m Neck Circumference: 18 "  FINDINGS:   Total Record Time (hours, min): 7 h 5 min  Total Sleep Time (hours, min):  6 h 34 min   Percent REM (%):    Not enough REM detected   Calculated pAHI (per hour): 53.7        REM pAHI: N/A   NREM pAHI: N/A Supine AHI: 56.9   Oxygen Saturation (%) Mean: 88  Minimum oxygen saturation (%):        57   O2 Saturation Range (%): 57-99  O2Saturation (minutes) <=88%: 58.6 min  Pulse Mean (bpm):    61  Pulse Range (47-103)   IMPRESSION: This HST indicated the presence of severe OSA (obstructive sleep apnea) with an AHI of 53.7/h and multiple brief oxygen desaturations were documented.  62.4 minutes, or 15.8% of total sleep time, were in hypoxemia under 90% saturation and Nadir was at 57%. No REM/ NREM differentiation was possible.   RECOMMENDATION:  This severe form of sleep apnea is accompanied by desoxygenation and would require PAP therapy. We will urgently order a CPAP auto set for 6-18 cm water, 2 cm EPR and heated humidity with mask of choice, Follow up with ONO.    INTERPRETING PHYSICIAN:   Melvyn Novas, MD 08/31/2020   [facility] and [facility] Board certified by The [employer] of Sleep Medicine and Diplomate of the [employer] of Sleep Medicine. Board certified In Neurology through the ABPN, Fellow of the [employer] of  Neurology. Medical Director of [facility].    Sleep Summary  Oxygen Saturation Statistics   Start Study Time: End Study Time: Total Recording Time: 3:05:34 AM     10:11:07 AM          7 h, 5 min  Total Sleep Time Inconclusive REM Detection 6 h, 34 min    Mean: 88 Minimum: 57 Maximum: 99  Mean of Desaturations Nadirs (%):   83  Oxygen Desatur. %:  4-9 10-20 >20 Total  Events Number Total   94  23 71.8 17.6  14 10.7  131 100.0  Oxygen Saturation: <90 <=88 <85 <80 <70  Duration (minutes): Sleep % 62.4 15.8 58.6 51.5 14.9 13.1 38.2 9.7 6.2 1.6     Respiratory Indices      Total Events REM NREM All Night  pRDI: pAHI 3%: ODI 4%: pAHIc 3%: % CSR: pAHI 4%:  187  187  131  6 0.0 146 N/A N/A N/A N/A N/A N/A N/A N/A 53.7 53.7 37.6 1.7 41.9       Pulse Rate Statistics during Sleep (BPM)      Mean: 61 Minimum: 47 Maximum: 103         Position Supine Prone Right Left Non-Supine  Sleep (min) 234.5 0.0 159.5 0.0 159.5  Sleep % 59.5 0.0 40.5 0.0 40.5  pRDI 56.9 N/A 49.3 N/A 49.3  pAHI 3% 56.9 N/A 49.3 N/A 49.3  ODI 4% 41.4 N/A 32.4 N/A 32.4     Snoring Statistics Snoring Level (dB) >40 >50 >60 >70 >80 >Threshold (45)  Sleep (min) 54.2 5.7 2.0 0.0 0.0 13.4  Sleep % 13.8 1.4 0.5 0.0 0.0 3.4

## 2020-09-01 ENCOUNTER — Other Ambulatory Visit: Payer: Self-pay | Admitting: *Deleted

## 2020-09-01 ENCOUNTER — Telehealth: Payer: Self-pay | Admitting: Neurology

## 2020-09-01 DIAGNOSIS — K449 Diaphragmatic hernia without obstruction or gangrene: Secondary | ICD-10-CM

## 2020-09-01 DIAGNOSIS — K219 Gastro-esophageal reflux disease without esophagitis: Secondary | ICD-10-CM

## 2020-09-01 MED ORDER — OMEPRAZOLE 20 MG PO CPDR: DELAYED_RELEASE_CAPSULE | ORAL | 0 refills | Status: DC

## 2020-09-01 NOTE — Telephone Encounter (Signed)
-----   Message from Melvyn Novas, MD sent at 08/31/2020  1:02 PM EDT ----- IMPRESSION: This HST indicated the presence of severe OSA (obstructive sleep apnea) with an AHI of 53.7/h and multiple brief oxygen desaturations were documented.  62.4 minutes, or 15.8% of total sleep time, were in hypoxemia under 90% saturation and Nadir was at 57%. No REM/ NREM differentiation was possible.   RECOMMENDATION:  This severe form of sleep apnea is accompanied by desoxygenation and would require PAP therapy. We will urgently order a CPAP auto set for 6-18 cm water, 2 cm EPR and heated humidity with mask of choice, Follow up with ONO.    INTERPRETING PHYSICIAN:   Melvyn Novas, MD4/25/2022

## 2020-09-01 NOTE — Telephone Encounter (Signed)
I called pt. I advised pt that Dr. Vickey Huger reviewed their sleep study results and found that pt has severe sleep apnea. Dr. Vickey Huger recommends that pt starts. I reviewed PAP compliance expectations with the pt. Pt is agreeable to starting a CPAP. I advised pt that an order will be sent to a DME, [facility] and [facility] will call the pt within about one week after they file with the pt's insurance. [facility]) will show the pt how to use the machine, fit for masks, and troubleshoot the CPAP if needed. A follow up appt was made for insurance purposes with Dr. Vickey Huger on Aug 15,2022 at 8:30 am. Pt verbalized understanding to arrive 15 minutes early and bring their CPAP. A letter with all of this information in it will be mailed to the pt as a reminder. I verified with the pt that the address we have on file is correct. Pt verbalized understanding of results. Pt had no questions at this time but was encouraged to call back if questions arise. I have sent the order to [facility])  and have received confirmation that they have received the order.

## 2020-09-14 NOTE — Telephone Encounter (Signed)
The order was sent on 09/01/20 to our contact people at [facility]. They do that the patient's order and its most likely still being worked up through Community education officer. I have reached out to [facility]) to inform them to contact the patient and reassure the patient they do have the order and will be in touch.

## 2020-09-14 NOTE — Telephone Encounter (Signed)
Pt called, spoke with [facility] and they said have not received anything from [facility]. Would like a call from the nurse.

## 2020-09-14 NOTE — Telephone Encounter (Signed)
Spoke with Trula Ore Emergency planning/management officer) at [facility] and she states that she will have someone call him. He had spoke with their oncall service who don't have access to local orders.

## 2020-09-22 ENCOUNTER — Telehealth: Payer: Self-pay

## 2020-09-22 DIAGNOSIS — I10 Essential (primary) hypertension: Secondary | ICD-10-CM

## 2020-09-22 DIAGNOSIS — K449 Diaphragmatic hernia without obstruction or gangrene: Secondary | ICD-10-CM

## 2020-09-22 DIAGNOSIS — K219 Gastro-esophageal reflux disease without esophagitis: Secondary | ICD-10-CM

## 2020-09-22 DIAGNOSIS — E7800 Pure hypercholesterolemia, unspecified: Secondary | ICD-10-CM

## 2020-09-23 MED ORDER — PRAVASTATIN SODIUM 40 MG PO TABS: 40.0000 mg | ORAL_TABLET | Freq: Every day | ORAL | 1 refills | Status: DC

## 2020-09-23 MED ORDER — OMEPRAZOLE 20 MG PO CPDR: DELAYED_RELEASE_CAPSULE | ORAL | 0 refills | Status: DC

## 2020-09-23 MED ORDER — TRIAMTERENE-HCTZ 37.5-25 MG PO TABS: 1.0000 | ORAL_TABLET | Freq: Every day | ORAL | 1 refills | Status: DC

## 2020-09-23 NOTE — Telephone Encounter (Signed)
Refills sent to pharmacy. 

## 2020-09-25 ENCOUNTER — Other Ambulatory Visit: Payer: Self-pay | Admitting: *Deleted

## 2020-09-25 DIAGNOSIS — F411 Generalized anxiety disorder: Secondary | ICD-10-CM

## 2020-09-25 DIAGNOSIS — M7742 Metatarsalgia, left foot: Secondary | ICD-10-CM

## 2020-09-25 DIAGNOSIS — I10 Essential (primary) hypertension: Secondary | ICD-10-CM

## 2020-09-25 DIAGNOSIS — F339 Major depressive disorder, recurrent, unspecified: Secondary | ICD-10-CM

## 2020-09-25 DIAGNOSIS — M7741 Metatarsalgia, right foot: Secondary | ICD-10-CM

## 2020-09-25 MED ORDER — PAROXETINE HCL ER 25 MG PO TB24: 25.0000 mg | ORAL_TABLET | Freq: Every day | ORAL | 0 refills | Status: DC

## 2020-09-25 MED ORDER — DICLOFENAC SODIUM 75 MG PO TBEC: 75.0000 mg | DELAYED_RELEASE_TABLET | Freq: Two times a day (BID) | ORAL | 0 refills | Status: DC

## 2020-09-25 MED ORDER — TELMISARTAN 40 MG PO TABS: 40.0000 mg | ORAL_TABLET | Freq: Every day | ORAL | 1 refills | Status: DC

## 2020-10-19 ENCOUNTER — Encounter: Payer: Self-pay | Admitting: Family Medicine

## 2020-10-19 ENCOUNTER — Other Ambulatory Visit: Payer: Self-pay

## 2020-10-19 ENCOUNTER — Ambulatory Visit (INDEPENDENT_AMBULATORY_CARE_PROVIDER_SITE_OTHER): Payer: 59 | Admitting: Family Medicine

## 2020-10-19 VITALS — BP 116/77 | HR 69 | Temp 97.6°F | Ht 70.0 in | Wt 244.4 lb

## 2020-10-19 DIAGNOSIS — E559 Vitamin D deficiency, unspecified: Secondary | ICD-10-CM

## 2020-10-19 DIAGNOSIS — G4733 Obstructive sleep apnea (adult) (pediatric): Secondary | ICD-10-CM

## 2020-10-19 DIAGNOSIS — Z125 Encounter for screening for malignant neoplasm of prostate: Secondary | ICD-10-CM

## 2020-10-19 DIAGNOSIS — I10 Essential (primary) hypertension: Secondary | ICD-10-CM | POA: Diagnosis not present

## 2020-10-19 DIAGNOSIS — F339 Major depressive disorder, recurrent, unspecified: Secondary | ICD-10-CM

## 2020-10-19 DIAGNOSIS — W57XXXA Bitten or stung by nonvenomous insect and other nonvenomous arthropods, initial encounter: Secondary | ICD-10-CM

## 2020-10-19 DIAGNOSIS — I712 Thoracic aortic aneurysm, without rupture, unspecified: Secondary | ICD-10-CM

## 2020-10-19 DIAGNOSIS — S70361A Insect bite (nonvenomous), right thigh, initial encounter: Secondary | ICD-10-CM

## 2020-10-19 DIAGNOSIS — E7800 Pure hypercholesterolemia, unspecified: Secondary | ICD-10-CM

## 2020-10-19 DIAGNOSIS — F411 Generalized anxiety disorder: Secondary | ICD-10-CM

## 2020-10-19 DIAGNOSIS — K219 Gastro-esophageal reflux disease without esophagitis: Secondary | ICD-10-CM

## 2020-10-19 DIAGNOSIS — K449 Diaphragmatic hernia without obstruction or gangrene: Secondary | ICD-10-CM

## 2020-10-19 MED ORDER — PAROXETINE HCL ER 25 MG PO TB24: 25.0000 mg | ORAL_TABLET | Freq: Every day | ORAL | 3 refills | Status: DC

## 2020-10-19 MED ORDER — TELMISARTAN 40 MG PO TABS: 40.0000 mg | ORAL_TABLET | Freq: Every day | ORAL | 3 refills | Status: DC

## 2020-10-19 MED ORDER — TRIAMTERENE-HCTZ 37.5-25 MG PO TABS: 1.0000 | ORAL_TABLET | Freq: Every day | ORAL | 3 refills | Status: DC

## 2020-10-19 MED ORDER — OMEPRAZOLE 20 MG PO CPDR: DELAYED_RELEASE_CAPSULE | ORAL | 3 refills | Status: DC

## 2020-10-19 MED ORDER — PRAVASTATIN SODIUM 40 MG PO TABS: 40.0000 mg | ORAL_TABLET | Freq: Every day | ORAL | 3 refills | Status: DC

## 2020-10-19 NOTE — Patient Instructions (Signed)
You had labs performed today.  You will be contacted with the results of the labs once they are available, usually in the next 3 business days for routine lab work.  If you have an active my chart account, they will be released to your MyChart.  If you prefer to have these labs released to you via telephone, please let us know.  Mediterranean Diet A Mediterranean diet refers to food and lifestyle choices that are based on the traditions of countries located on the [employer]. This way of eating has been shown to help prevent certain conditions and improve outcomes forpeople who have chronic diseases, like kidney disease and heart disease. What are tips for following this plan? Lifestyle Cook and eat meals together with your family, when possible. Drink enough fluid to keep your urine clear or pale yellow. Be physically active every day. This includes: Aerobic exercise like running or swimming. Leisure activities like gardening, walking, or housework. Get 7-8 hours of sleep each night. If recommended by your health care provider, drink red wine in moderation. This means 1 glass a day for nonpregnant women and 2 glasses a day for men. A glass of wine equals 5 oz (150 mL). Reading food labels  Check the serving size of packaged foods. For foods such as rice and pasta, the serving size refers to the amount of cooked product, not dry. Check the total fat in packaged foods. Avoid foods that have saturated fat or trans fats. Check the ingredients list for added sugars, such as corn syrup.  Shopping At the grocery store, buy most of your food from the areas near the walls of the store. This includes: Fresh fruits and vegetables (produce). Grains, beans, nuts, and seeds. Some of these may be available in unpackaged forms or large amounts (in bulk). Fresh seafood. Poultry and eggs. Low-fat dairy products. Buy whole ingredients instead of prepackaged foods. Buy fresh fruits and vegetables  in-season from local farmers markets. Buy frozen fruits and vegetables in resealable bags. If you do not have access to quality fresh seafood, buy precooked frozen shrimp or canned fish, such as tuna, salmon, or sardines. Buy small amounts of raw or cooked vegetables, salads, or olives from the deli or salad bar at your store. Stock your pantry so you always have certain foods on hand, such as olive oil, canned tuna, canned tomatoes, rice, pasta, and beans. Cooking Cook foods with extra-virgin olive oil instead of using butter or other vegetable oils. Have meat as a side dish, and have vegetables or grains as your main dish. This means having meat in small portions or adding small amounts of meat to foods like pasta or stew. Use beans or vegetables instead of meat in common dishes like chili or lasagna. Experiment with different cooking methods. Try roasting or broiling vegetables instead of steaming or sauteing them. Add frozen vegetables to soups, stews, pasta, or rice. Add nuts or seeds for added healthy fat at each meal. You can add these to yogurt, salads, or vegetable dishes. Marinate fish or vegetables using olive oil, lemon juice, garlic, and fresh herbs. Meal planning  Plan to eat 1 vegetarian meal one day each week. Try to work up to 2 vegetarian meals, if possible. Eat seafood 2 or more times a week. Have healthy snacks readily available, such as: Vegetable sticks with hummus. Greek yogurt. Fruit and nut trail mix. Eat balanced meals throughout the week. This includes: Fruit: 2-3 servings a day Vegetables: 4-5 servings a day  Low-fat dairy: 2 servings a day Fish, poultry, or lean meat: 1 serving a day Beans and legumes: 2 or more servings a week Nuts and seeds: 1-2 servings a day Whole grains: 6-8 servings a day Extra-virgin olive oil: 3-4 servings a day Limit red meat and sweets to only a few servings a month  What are my food choices? Mediterranean  diet Recommended Grains: Whole-grain pasta. Brown rice. Bulgar wheat. Polenta. Couscous. Whole-wheat bread. Orpah Cobb. Vegetables: Artichokes. Beets. Broccoli. Cabbage. Carrots. Eggplant. Green beans. Chard. Kale. Spinach. Onions. Leeks. Peas. Squash. Tomatoes. Peppers. Radishes. Fruits: Apples. Apricots. Avocado. Berries. Bananas. Cherries. Dates. Figs. Grapes. Lemons. Melon. Oranges. Peaches. Plums. Pomegranate. Meats and other protein foods: Beans. Almonds. Sunflower seeds. Pine nuts. Peanuts. Cod. Salmon. Scallops. Shrimp. Tuna. Tilapia. Clams. Oysters. Eggs. Dairy: Low-fat milk. Cheese. Greek yogurt. Beverages: Water. Red wine. Herbal tea. Fats and oils: Extra virgin olive oil. Avocado oil. Grape seed oil. Sweets and desserts: [place] yogurt with honey. Baked apples. Poached pears. Trail mix. Seasoning and other foods: Basil. Cilantro. Coriander. Cumin. Mint. Parsley. Sage. Rosemary. Tarragon. Garlic. Oregano. Thyme. Pepper. Balsalmic vinegar. Tahini. Hummus. Tomato sauce. Olives. Mushrooms. Limit these Grains: Prepackaged pasta or rice dishes. Prepackaged cereal with added sugar. Vegetables: Deep fried potatoes (french fries). Fruits: Fruit canned in syrup. Meats and other protein foods: Beef. Pork. Lamb. Poultry with skin. Hot dogs. Tomasa Blase. Dairy: Ice cream. Sour cream. Whole milk. Beverages: Juice. Sugar-sweetened soft drinks. Beer. Liquor and spirits. Fats and oils: Butter. Canola oil. Vegetable oil. Beef fat (tallow). Lard. Sweets and desserts: Cookies. Cakes. Pies. Candy. Seasoning and other foods: Mayonnaise. Premade sauces and marinades. The items listed may not be a complete list. Talk with your dietitian aboutwhat dietary choices are right for you. Summary The Mediterranean diet includes both food and lifestyle choices. Eat a variety of fresh fruits and vegetables, beans, nuts, seeds, and whole grains. Limit the amount of red meat and sweets that you eat. Talk with your  health care provider about whether it is safe for you to drink red wine in moderation. This means 1 glass a day for nonpregnant women and 2 glasses a day for men. A glass of wine equals 5 oz (150 mL). This information is not intended to replace advice given to you by your health care provider. Make sure you discuss any questions you have with your healthcare provider. Document Revised: 12/24/2015 Document Reviewed: 12/17/2015 Elsevier Patient Education  2020 [employer].

## 2020-10-19 NOTE — Progress Notes (Signed)
Subjective: CC: Follow-up hypertension, sleep apnea, tick bites PCP: Janora Norlander, DO Jacob Cline is a 60 y.o. male presenting to clinic today for:  1.  Hypertension with hyperlipidemia/severe OSA Patient is compliant with Pravachol 40 mg daily, Micardis 40 mg daily and triamterene-hydrochlorothiazide 37-25 mg daily.  No reports of chest pain, shortness of breath, edema.  He was recently diagnosed with severe sleep apnea and reports compliance with the CPAP machine.  He feels well rested with his CPAP machine.  Has follow-up with his sleep doctor soon.  2.  Tick bite Patient reports that he was bitten by a tick sometime on Thursday or 2022-07-13.  The tick was dead when he removed it.  He had some itching at the site but denies any fevers, rash, myalgia, arthralgia, headache, fatigue or other concerning symptoms or signs.  Was wondering if he needed any antibiotics for this.  3.  Sciatica Patient reports that he had a back injection performed about a week ago for sciatica.  He notes that the symptoms have improved quite a bit but have not totally resolved.  He has an appointment on Thursday for recheck.   ROS: Per HPI  Allergies  Allergen Reactions   Other Anaphylaxis    Make me hurt alot   Pravastatin Other (See Comments)    Stiff neck   Codeine     Unknown    Lovastatin     Makes me hurt alot   Niaspan [Niacin Er]     Make me hurt alot   Past Medical History:  Diagnosis Date   BPH (benign prostatic hypertrophy)    Hemorrhoids    Hiatal hernia    Hyperlipidemia    Hypertension    Irregular heart beat    Obesity    Post-operative nausea and vomiting     Current Outpatient Medications:    acetaminophen (TYLENOL) 500 MG tablet, Take by mouth., Disp: , Rfl:    aspirin 81 MG chewable tablet, Chew 81 mg by mouth daily., Disp: , Rfl:    cholecalciferol (VITAMIN D) 1000 UNITS tablet, Take 1,000 Units by mouth daily. 3 tablets daily, Disp: , Rfl:    Cinnamon 500 MG  capsule, Take 1,000 mg by mouth daily. , Disp: , Rfl:    diclofenac (VOLTAREN) 75 MG EC tablet, Take 1 tablet (75 mg total) by mouth 2 (two) times daily., Disp: 180 tablet, Rfl: 0   Multiple Vitamin (MULTIVITAMIN) tablet, Take 1 tablet by mouth daily. , Disp: , Rfl:    Omega-3 Fatty Acids (FISH OIL) 1000 MG CAPS, Take 1 capsule by mouth daily. , Disp: , Rfl:    omeprazole (PRILOSEC) 20 MG capsule, TAKE 1 CAPSULE BY MOUTH TWICE DAILY BEFORE MEAL(S), Disp: 180 capsule, Rfl: 0   PARoxetine (PAXIL-CR) 25 MG 24 hr tablet, Take 1 tablet (25 mg total) by mouth daily., Disp: 90 tablet, Rfl: 0   pravastatin (PRAVACHOL) 40 MG tablet, Take 1 tablet (40 mg total) by mouth at bedtime., Disp: 90 tablet, Rfl: 1   telmisartan (MICARDIS) 40 MG tablet, Take 1 tablet (40 mg total) by mouth daily., Disp: 90 tablet, Rfl: 1   triamterene-hydrochlorothiazide (MAXZIDE-25) 37.5-25 MG tablet, Take 1 tablet by mouth daily., Disp: 90 tablet, Rfl: 1   TURMERIC PO, Take 1 capsule by mouth daily. , Disp: , Rfl:  Social History   Socioeconomic History   Marital status: Married    Spouse name: Not on file   Number of children: Not on file  Years of education: Not on file   Highest education level: Not on file  Occupational History   Occupation: Full time  Tobacco Use   Smoking status: Never   Smokeless tobacco: Never  Vaping Use   Vaping Use: Never used  Substance and Sexual Activity   Alcohol use: No    Alcohol/week: 0.0 standard drinks   Drug use: No   Sexual activity: Not on file  Other Topics Concern   Not on file  Social History Narrative   Not on file   Social Determinants of Health   Financial Resource Strain: Not on file  Food Insecurity: Not on file  Transportation Needs: Not on file  Physical Activity: Not on file  Stress: Not on file  Social Connections: Not on file  Intimate Partner Violence: Not on file   Family History  Problem Relation Age of Onset   Diabetes Mother    Scleroderma  Sister    Diabetes Brother    Diabetes Brother    Colon cancer Other        family history    Prostate cancer Other        family history     Objective: Office vital signs reviewed. BP 116/77   Pulse 69   Temp 97.6 F (36.4 C)   Ht _0  (1.778 m)   Wt 244 lb 6.4 oz (110.9 kg)   SpO2 98%   BMI 35.07 kg/m   Physical Examination:  General: Awake, alert, morbidly obese, No acute distress HEENT: Normal; sclera white.  Moist mucous membranes.  No carotid bruits Cardio: regular rate and rhythm, S1S2 heard, no murmurs appreciated Pulm: clear to auscultation bilaterally, no wheezes, rhonchi or rales; normal work of breathing on room air GI: Abdomen obese Extremities: warm, well perfused, No edema, cyanosis or clubbing; +2 pulses bilaterally MSK: normal gait and station Skin: Small healing insect bite noted but no appreciable target signs  Assessment/ Plan: 60 y.o. male   Severe obstructive sleep apnea - Plan: CBC with Differential  Essential hypertension - Plan: CMP14+EGFR, telmisartan (MICARDIS) 40 MG tablet, triamterene-hydrochlorothiazide (MAXZIDE-25) 37.5-25 MG tablet  Pure hypercholesterolemia - Plan: CMP14+EGFR, Lipid Panel, TSH, pravastatin (PRAVACHOL) 40 MG tablet  Thoracic aortic aneurysm without rupture (HCC) - Plan: CBC with Differential  Morbid obesity (HCC)  Tick bite of right thigh, initial encounter  Vitamin D deficiency - Plan: VITAMIN D 25 Hydroxy (Vit-D Deficiency, Fractures)  Screening for malignant neoplasm of prostate - Plan: PSA  Hiatal hernia with gastroesophageal reflux - Plan: omeprazole (PRILOSEC) 20 MG capsule  Depression, recurrent (HCC) - Plan: PARoxetine (PAXIL-CR) 25 MG 24 hr tablet  GAD (generalized anxiety disorder) - Plan: PARoxetine (PAXIL-CR) 25 MG 24 hr tablet  Compliant with CPAP.  Continue current regimen.  Follow-up with sleep medicine as discussed  Blood pressure is controlled.  Continue current regimen.  Check renal function  and potassium given use of concomitant triamterene and ARB  Check lipid panel.  Continue pravastatin  Morbidly obese and I instructed him that lifestyle modification is in fact needed despite the fact that he is treated with cholesterol blood pressure medication.  He still has cardiovascular risk given body habitus.  No signs or symptoms to suggest Lyme disease.  We discussed signs and symptoms of Lyme disease however and I have informed him that he should contact me should he develop any of these.  We will consider treatment with doxycycline at that point.  Otherwise watchful waiting  Patient is compliant with vitamin  D.  He has no signs or symptoms concerning for BPH.  Labs were collected with the aforementioned labs today  GERD depression and anxiety were not discussed that he needed refills.  Medications have been sent  No orders of the defined types were placed in this encounter.  No orders of the defined types were placed in this encounter.    Janora Norlander, DO [place] [phone]

## 2020-10-20 LAB — CBC WITH DIFFERENTIAL/PLATELET
Basophils Absolute: 0 10*3/uL (ref 0.0–0.2)
Basos: 1 %
EOS (ABSOLUTE): 0.1 10*3/uL (ref 0.0–0.4)
Eos: 1 %
Hematocrit: 44.8 % (ref 37.5–51.0)
Hemoglobin: 15.1 g/dL (ref 13.0–17.7)
Immature Grans (Abs): 0 10*3/uL (ref 0.0–0.1)
Immature Granulocytes: 0 %
Lymphocytes Absolute: 1.7 10*3/uL (ref 0.7–3.1)
Lymphs: 23 %
MCH: 29.9 pg (ref 26.6–33.0)
MCHC: 33.7 g/dL (ref 31.5–35.7)
MCV: 89 fL (ref 79–97)
Monocytes Absolute: 0.6 10*3/uL (ref 0.1–0.9)
Monocytes: 9 %
Neutrophils Absolute: 5 10*3/uL (ref 1.4–7.0)
Neutrophils: 66 %
Platelets: 191 10*3/uL (ref 150–450)
RBC: 5.05 x10E6/uL (ref 4.14–5.80)
RDW: 14.1 % (ref 11.6–15.4)
WBC: 7.5 10*3/uL (ref 3.4–10.8)

## 2020-10-20 LAB — TSH: TSH: 1.54 u[IU]/mL (ref 0.450–4.500)

## 2020-10-20 LAB — LIPID PANEL
Chol/HDL Ratio: 2.8 ratio (ref 0.0–5.0)
Cholesterol, Total: 124 mg/dL (ref 100–199)
HDL: 45 mg/dL (ref >39)
LDL Chol Calc (NIH): 63 mg/dL (ref 0–99)
Triglycerides: 83 mg/dL (ref 0–149)
VLDL Cholesterol Cal: 16 mg/dL (ref 5–40)

## 2020-10-20 LAB — CMP14+EGFR
ALT: 45 IU/L — ABNORMAL HIGH (ref 0–44)
AST: 21 IU/L (ref 0–40)
Albumin/Globulin Ratio: 2 (ref 1.2–2.2)
Albumin: 4.5 g/dL (ref 3.8–4.8)
Alkaline Phosphatase: 76 IU/L (ref 44–121)
BUN/Creatinine Ratio: 22 (ref 10–24)
BUN: 23 mg/dL (ref 8–27)
Bilirubin Total: 0.4 mg/dL (ref 0.0–1.2)
CO2: 25 mmol/L (ref 20–29)
Calcium: 9.6 mg/dL (ref 8.6–10.2)
Chloride: 97 mmol/L (ref 96–106)
Creatinine, Ser: 1.06 mg/dL (ref 0.76–1.27)
Globulin, Total: 2.2 g/dL (ref 1.5–4.5)
Glucose: 106 mg/dL — ABNORMAL HIGH (ref 65–99)
Potassium: 4.8 mmol/L (ref 3.5–5.2)
Sodium: 137 mmol/L (ref 134–144)
Total Protein: 6.7 g/dL (ref 6.0–8.5)
eGFR: 79 mL/min/{1.73_m2} (ref >59)

## 2020-10-20 LAB — PSA: Prostate Specific Ag, Serum: 0.7 ng/mL (ref 0.0–4.0)

## 2020-10-20 LAB — VITAMIN D 25 HYDROXY (VIT D DEFICIENCY, FRACTURES): Vit D, 25-Hydroxy: 36.6 ng/mL (ref 30.0–100.0)

## 2020-11-21 NOTE — Progress Notes (Signed)
Jacob Cline Date of Birth  [birth year]       [facility]    [place] [address]  [address] [place], [place]  [address]   [place], [place]  [address] [phone]     [phone]   Fax  [phone]    Fax [phone]  Problem List: 1. Chest pain:  seen in the emergency room 2. Hypertension 3. Hyperlipidemia      I have seen Jacob Cline in the past for CP and abnormal ECG.  We have performed a cath in the past which was normal.   He also has had a stress myoview which was normal.    Jacob Cline had an episode of chest heaviness about a month ago.  Also associated with a head ache.  Was seen in the ER, work up was negative.   He has had some occasional recurent chest pain,.  Dull , shooting pain, comes and goes.   Also seems to have chest heaviness that comes on at random times ( not associated with any specific activity) which Seems to be relived with antiacids.  Has not seen his medical doctor.    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    He does not get any regular exercise.   He has taken Lovastatin in the past but stopped taking it due to  muscle aches.    Sep 10, 2013:  Jacob Cline was last seen in January, 2015. He had a stress Myoview study for recurrent episodes of chest discomfort.  The Myoview showed no evidence of ischemia. His left ventricular systolic function was normal with an EF of 57%.  Still having some CP.   He works 14 hour days ( 2 AM to 4 PM)   so has limited time to exercise.  Tries not to eat any fried foods by sometimes does not have a good option.   November 03, 2014:  Jacob Cline is doing well.  No CP.  Able to do all of his normal activities without any limitation .   Was seen by Dr. Christell Constant for pre-op visit and his ECG was found to be abnormal .    he's had T-wave inversions in the anterolateral leads for the past several years. He had a Myoview study last year which was normal. He had no evidence of  ischemia. He's done very well and has not had any cardiac symptoms.  April 23, 2018: Ms. seen back today for follow-up visit.  He was last seen 3 years ago. He has baseline T wave inversions in his lateral leads on his EKG. Is here for a check up  Has not had any particular issues.   No CP , no dyspnea. Works - drives a Multimedia programmer  Has developed leg swelling and chronic stasis changs  Still eats fast  Food  No regular exercise   November 19, 2018:  Jacob Cline is seen today for follow up for his CP and HTN Echocardiogram April 30, 2018 reveals normal left ventricular systolic function.  He has grade 2 diastolic dysfunction.  He has mild dilatation of the ascending aorta.  Doing well .   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 He has normal LV systolic function.  Grade 2 DD Mild dilatation of his asc. Aorta   Dealing with a pulled back currently  Some exercise  Avoids salt .   Breathing is good  Had been doing well with his diet before his back injury    Current Outpatient Medications on File Prior to Visit  Medication Sig Dispense Refill   acetaminophen (TYLENOL) 500 MG tablet Take by mouth.     aspirin 81 MG chewable tablet Chew 81 mg by mouth daily.     cholecalciferol (VITAMIN D) 1000 UNITS tablet Take 1,000 Units by mouth daily. 3 tablets daily     Cinnamon 500 MG capsule Take 1,000 mg by mouth daily.      diclofenac (VOLTAREN) 75 MG EC tablet Take 1 tablet (75 mg total) by mouth 2 (two) times daily. 180 tablet 0   gabapentin (NEURONTIN) 300 MG capsule Take 300 mg by mouth at bedtime.     magnesium oxide (MAG-OX) 400 MG tablet Take 400 mg by mouth daily.     Multiple Vitamin (MULTIVITAMIN) tablet Take 1 tablet by mouth daily.      Omega-3 Fatty Acids (FISH OIL)  1000 MG CAPS Take 1 capsule by mouth daily.      omeprazole (PRILOSEC) 20 MG capsule TAKE 1 CAPSULE BY MOUTH TWICE DAILY BEFORE MEAL(S) 180 capsule 3   PARoxetine (PAXIL-CR) 25 MG 24 hr tablet Take 1 tablet (25 mg total) by mouth daily. 90 tablet 3   pravastatin (PRAVACHOL) 40 MG tablet Take 1 tablet (40 mg total) by mouth at bedtime. 90 tablet 3   telmisartan (MICARDIS) 40 MG tablet Take 1 tablet (40 mg total) by mouth daily. 90 tablet 3   triamterene-hydrochlorothiazide (MAXZIDE-25) 37.5-25 MG tablet Take 1 tablet by mouth daily. 90 tablet 3   TURMERIC PO Take 1 capsule by mouth daily.      No current facility-administered medications on file prior to visit.    Allergies  Allergen Reactions   Other Anaphylaxis    Make me hurt alot   Codeine     Unknown    Lovastatin     Makes me hurt alot   Niaspan [Niacin Er]     Make me hurt alot    Past Medical History:  Diagnosis Date   BPH (benign prostatic hypertrophy)    Hemorrhoids    Hiatal hernia    Hyperlipidemia    Hypertension    Irregular heart beat    Obesity    Post-operative nausea and vomiting     Past Surgical History:  Procedure Laterality Date   athroscopy of rt knee  02/02   henia - umbilical surgery  07/17/07   HERNIA REPAIR  05/15/2017   double hernia surgery   HIP SURGERY  11/2014   right hip   MOUTH SURGERY     VASECTOMY      Social History   Tobacco Use  Smoking Status Never  Smokeless Tobacco Never    Social History   Substance and Sexual Activity  Alcohol Use No   Alcohol/week: 0.0 standard drinks    Family History  Problem Relation Age of Onset   Diabetes Mother    Scleroderma Sister    Diabetes Brother    Diabetes Brother    Colon cancer Other        family history    Prostate cancer Other        family history     Reviw  of Systems:  Reviewed in the HPI.  All other systems are negative.   Physical Exam: Blood pressure 112/72, pulse 66, height 5\' 10"  (1.778 m), weight 248 lb  12.8 oz (112.9 kg), SpO2 97 %.  GEN:  Well nourished, well developed in no acute distress HEENT: Normal NECK: No JVD; No carotid bruits LYMPHATICS: No lymphadenopathy CARDIAC: RRR , no murmurs, rubs, gallops RESPIRATORY:  Clear to auscultation without rales, wheezing or rhonchi  ABDOMEN: Soft, non-tender, non-distended MUSCULOSKELETAL:  No edema; No deformity  SKIN: Warm and dry NEUROLOGIC:  Alert and oriented x 3    ECG: October 14, 2019:    Assessment / Plan:    1. Chest pain:      no further CP ,    2. Hypertension -  BP is well controlled.    3.   Mild asc. Aortic aneurism:   asc aorta measures 4.0 cm. Recheck CT A  of aorta in 1 year.    4.  Obesity:   advised weight loss    4. Hyperlipidemia Managed by his primary    October 16, 2019, MD  11/23/2020 9:13 AM    [facility] [address] [place],  [address] [place], [place]  [place] Pager [phone] Phone: [phone]; Fax: [phone]

## 2020-11-23 ENCOUNTER — Other Ambulatory Visit: Payer: Self-pay

## 2020-11-23 ENCOUNTER — Encounter: Payer: Self-pay | Admitting: Cardiovascular Disease

## 2020-11-23 ENCOUNTER — Ambulatory Visit (INDEPENDENT_AMBULATORY_CARE_PROVIDER_SITE_OTHER): Payer: 59 | Admitting: Cardiovascular Disease

## 2020-11-23 VITALS — BP 112/72 | HR 66 | Ht 70.0 in | Wt 248.8 lb

## 2020-11-23 DIAGNOSIS — I712 Thoracic aortic aneurysm, without rupture, unspecified: Secondary | ICD-10-CM

## 2020-11-23 DIAGNOSIS — I5032 Chronic diastolic (congestive) heart failure: Secondary | ICD-10-CM

## 2020-11-23 NOTE — Patient Instructions (Signed)
Medication Instructions:  Your physician recommends that you continue on your current medications as directed. Please refer to the Current Medication list given to you today.  Labwork: None ordered.  Testing/Procedures:  In 12 months: CT Angiography (CTA) of the Aorta, is a special type of CT scan that uses a computer to produce multi-dimensional views of major blood vessels throughout the body. In CT angiography, a contrast material is injected through an IV to help visualize the blood vessels   Follow-Up: Your physician recommends that you schedule a follow-up appointment in:   12 months with Dr. Elease Hashimoto.   Please have your CT performed prior to your appointment.   Any Other Special Instructions Will Be Listed Below (If Applicable).     If you need a refill on your cardiac medications before your next appointment, please call your pharmacy.

## 2020-12-02 ENCOUNTER — Encounter: Payer: Self-pay | Admitting: Neurology

## 2020-12-04 ENCOUNTER — Telehealth: Payer: Self-pay | Admitting: Family Medicine

## 2020-12-07 ENCOUNTER — Encounter: Payer: Self-pay | Admitting: Neurology

## 2020-12-07 ENCOUNTER — Ambulatory Visit (INDEPENDENT_AMBULATORY_CARE_PROVIDER_SITE_OTHER): Payer: 59 | Admitting: Neurology

## 2020-12-07 ENCOUNTER — Other Ambulatory Visit: Payer: Self-pay

## 2020-12-07 VITALS — BP 114/76 | HR 59 | Ht 70.0 in | Wt 260.0 lb

## 2020-12-07 DIAGNOSIS — Z9989 Dependence on other enabling machines and devices: Secondary | ICD-10-CM

## 2020-12-07 DIAGNOSIS — G4733 Obstructive sleep apnea (adult) (pediatric): Secondary | ICD-10-CM | POA: Diagnosis not present

## 2020-12-07 DIAGNOSIS — Z0289 Encounter for other administrative examinations: Secondary | ICD-10-CM

## 2020-12-07 NOTE — Addendum Note (Signed)
Addended by: Melvyn Novas on: 12/07/2020 09:11 AM   Modules accepted: Orders

## 2020-12-07 NOTE — Progress Notes (Signed)
SLEEP MEDICINE CLINIC    Provider:  Melvyn Novas, MD  Primary Care Physician:  [name], DO [address] 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 HTN and hypercholesterolemia       HISTORY OF PRESENT ILLNESS:  Jacob Cline is a 70. year -old  Caucasian male patient seen here as a referral on 12/07/2020 from DOT/ Dr Nadine Counts. Chief concern according to patient :  see above.  I have the pleasure to see our mutual patient again today, 12-07-2020, Mr. Jacob Cline underwent a home sleep test on August 31, 2020 in which he collected 6 hours and 34 minutes of total sleep time but almost no REM sleep was identified.  He had a very severe form of apnea with an AHI of 53.7 and his pulse range was between 47 and 103 bpm.  62 minutes were under 90% oxygen saturation with a nadir of oxygenation at 57%.  Based on these constellation was only a CPAP or BiPAP therapy was recommended and the patient was finally able to obtain an auto titration CPAP with a setting between 6 and 18 cmH2O, 2 cm EPR, and a mask of choice with heated humidification.  He was sent to [facility] and his 30-day download shows that he is 100% compliant by hours and days.  He uses on average CPAP for 7 hours and 2 minutes at night.  His minimum pressure of 6 maximum pressure is 18 and EPR level was in the get set to 2 cmH2O his AHI is 1.3/h.  This is the 95th percent resolution of his pressure at the 95th percentile is 13.2/h this means he has been served under the current settings.  Leak is moderate.  His sleepiness score by Epworth score is 3 cm 3 points.  He is not depressed and he has no new medications.  Based on his compliance he is to  be seen again in 1 year.  There has to be no changes in settings.  I will offer an overnight pulse oximetry if not already obtained.  Plan; ONO while on auto pap. He is 100%  compliant. He failed a nasal pillow, and switched to FFM , considers himself a mouth breather.   [name]  has a past medical history of BPH (benign prostatic hypertrophy), Hemorrhoids, Hiatal hernia, Hyperlipidemia, Hypertension, Irregular heart beat, Obesity, and Post-operative nausea and vomiting.     Sleep relevant medical history: Nocturia 2-3, cervical DDD. Family medical /sleep history: NO other family member on CPAP with OSA, insomnia, sleep walkers. brother has AAA. 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  Tobacco use  Never-.  ETOH use: 3-4 week max. Caffeine intake in form of Coffee( 2 ) Soda( 2 at night )  Regular exercise ; none.  Hobbies : few.    Sleep habits are as follows: The patient's  dinner time is packed - 2-3 AM  - The patient [facility] at [facility] and continues to sleep for 6 hours, wakes for 2-3 bathroom breaks.  Sleeps a longer time on weekends.  The preferred sleep position is sideways - and snores loudly- , with the support of 1 pillow.. Dreams are reportedly infrequent.  10 PM is the usual rise time. The patient wakes up with an alarm.  He reports not feeling fully refreshed or restored in AM, with symptoms such as dry mouth, morning headaches, and residual fatigue.  Naps are taken frequently.     Review of Systems: Out of a complete 14 system review, the patient complains of only the following symptoms, and all other reviewed systems are negative.:  Fatigue, sleepiness , loud snoring, fragmented sleep, nocturia- night shift worker.    How likely are you to doze in the  following situations: 0 = not likely, 1 = slight chance, 2 = moderate chance, 3 = high chance   Sitting and Reading? Watching Television? Sitting inactive in a public place (theater or meeting)? As a passenger in a car for an hour without a break? Lying down in the afternoon when circumstances permit? Sitting and talking to someone? Sitting quietly after lunch without alcohol? In a car, while stopped for a few minutes in traffic?   Total = 4/ 24 points   FSS endorsed at 30/ 63 points.   Social History   Socioeconomic History   Marital status: Married    Spouse name: Not on file   Number of children: Not on file   Years of education: Not on file   Highest education level: Not on file  Occupational History   Occupation: Full time  Tobacco Use   Smoking status: Never   Smokeless tobacco: Never  Vaping Use   Vaping Use: Never used  Substance and Sexual Activity   Alcohol use: No    Alcohol/week: 0.0 standard drinks   Drug use: No   Sexual activity: Not on file  Other Topics Concern   Not on file  Social History Narrative   Not on file   Social Determinants of Health   Financial Resource Strain: Not on file  Food Insecurity: Not on file  Transportation Needs: Not on file  Physical Activity: Not on file  Stress: Not on file  Social Connections: Not on file    Family History  Problem Relation Age of Onset   Diabetes Mother    Scleroderma Sister    Diabetes Brother    Diabetes Brother    Colon cancer Other        family history    Prostate cancer Other        family history     Past Medical History:  Diagnosis Date   BPH (benign prostatic hypertrophy)    Hemorrhoids    Hiatal hernia    Hyperlipidemia    Hypertension    Irregular heart beat    Obesity    Post-operative nausea and vomiting     Past Surgical History:  Procedure Laterality Date   athroscopy of rt knee  02/02   henia - umbilical surgery  07/17/07   HERNIA REPAIR  05/15/2017   double  hernia surgery   HIP SURGERY  11/2014   right hip   MOUTH SURGERY     VASECTOMY       Current Outpatient Medications on File Prior to Visit  Medication Sig Dispense Refill   acetaminophen (TYLENOL) 500  MG tablet Take by mouth.     aspirin 81 MG chewable tablet Chew 81 mg by mouth daily.     cholecalciferol (VITAMIN D) 1000 UNITS tablet Take 1,000 Units by mouth daily. 3 tablets daily     Cinnamon 500 MG capsule Take 1,000 mg by mouth daily.      diclofenac (VOLTAREN) 75 MG EC tablet Take 1 tablet (75 mg total) by mouth 2 (two) times daily. 180 tablet 0   gabapentin (NEURONTIN) 300 MG capsule Take 300 mg by mouth at bedtime.     magnesium oxide (MAG-OX) 400 MG tablet Take 400 mg by mouth daily.     Multiple Vitamin (MULTIVITAMIN) tablet Take 1 tablet by mouth daily.      Omega-3 Fatty Acids (FISH OIL) 1000 MG CAPS Take 1 capsule by mouth daily.      omeprazole (PRILOSEC) 20 MG capsule TAKE 1 CAPSULE BY MOUTH TWICE DAILY BEFORE MEAL(S) 180 capsule 3   PARoxetine (PAXIL-CR) 25 MG 24 hr tablet Take 1 tablet (25 mg total) by mouth daily. 90 tablet 3   pravastatin (PRAVACHOL) 40 MG tablet Take 1 tablet (40 mg total) by mouth at bedtime. 90 tablet 3   telmisartan (MICARDIS) 40 MG tablet Take 1 tablet (40 mg total) by mouth daily. 90 tablet 3   triamterene-hydrochlorothiazide (MAXZIDE-25) 37.5-25 MG tablet Take 1 tablet by mouth daily. 90 tablet 3   TURMERIC PO Take 1 capsule by mouth daily.      No current facility-administered medications on file prior to visit.    Allergies  Allergen Reactions   Other Anaphylaxis    Make me hurt alot   Codeine     Unknown    Lovastatin     Makes me hurt alot   Niaspan [Niacin Er]     Make me hurt alot    Physical exam:  Today's Vitals   12/07/20 0808  BP: 114/76  Pulse: (!) 59  Weight: 260 lb (117.9 kg)  Height: 5\' 10"  (1.778 m)   Body mass index is 37.31 kg/m.   Wt Readings from Last 3 Encounters:  12/07/20 260 lb (117.9 kg)   11/23/20 248 lb 12.8 oz (112.9 kg)  10/19/20 244 lb 6.4 oz (110.9 kg)     Ht Readings from Last 3 Encounters:  12/07/20 5\' 10"  (1.778 m)  11/23/20 5\' 10"  (1.778 m)  10/19/20 5\' 10"  (1.778 m)      General: The patient is awake, alert and appears not in acute distress. The patient is  groomed. Head: Normocephalic, atraumatic. Neck is supple.  Mallampati 2  neck circumference:18 inches .  Nasal airflow patent.   Retrognathia is not  seen.  Facial hair.  Dental status:  Cardiovascular:  Regular rate and cardiac rhythm by pulse,  without distended neck veins. Respiratory: Lungs are clear to auscultation.  Skin:  With evidence of ankle edema, no rash. Trunk: The patient's posture is erect.   Neurologic exam : The patient is awake and alert, oriented to place and time.   Memory subjective described as intact.  Attention span & concentration ability appears normal.  Speech is fluent,  without  dysarthria, dysphonia or aphasia.  Mood and affect are appropriate.   Cranial nerves: no loss of smell or taste reported  Pupils are equal and briskly reactive to light.  Funduscopic exam deferred. No cataracts.   Extraocular movements in vertical and horizontal planes were intact and without nystagmus. No Diplopia. Visual fields by finger perimetry are intact.  Hearing was intact to soft voice and finger rubbing. Facial sensation intact to fine touch.  Facial motor strength is symmetric and tongue and uvula move midline.  Neck ROM : rotation, tilt and flexion extension were normal for age and shoulder shrug was symmetrical.    Motor exam:  Symmetric bulk, tone and ROM.   Normal tone without cog wheeling, symmetric grip strength .   Sensory:  Fine touch, pinprick and vibration were normal.  Proprioception tested in the upper extremities was normal.   Coordination: Rapid alternating movements in the fingers/hands were of normal speed.  The Finger-to-nose maneuver was intact without evidence  of ataxia, dysmetria or tremor.   Gait and station: Patient could rise unassisted from a seated position, walked without assistive device.  Stance is of normal width/ base and the patient turned with 3 steps.  Toe and heel walk were deferred.  Deep tendon reflexes: in the  upper and lower extremities are symmetric and intact.  Babinski response was deferred.        After spending a total time of  45 minutes face to face and additional time for physical and neurologic examination, review of laboratory studies,  personal review of imaging studies, reports and results of other testing and review of referral information / records as far as provided in visit, I have established the following assessments: His BMI is elevated at 37, he does have a moderate upper airway obstruction, dental status is intact, no crowding he does have a bridge on the left lower side no dentures.  He reports some swelling in the ankles and this is most likely attributable to Norvasc-amlodipine.  He is also on a cholesterol-lowering statin.  His markers are such that he is at a higher risk of having obstructive sleep apnea and his wife has told him that he snores and rather loudly.  He is treated for GERD so I do not think that acid reflux creates any apnea in him.  I will order a home sleep test for him and will [place] as urgent I will ask the technologist also to max the study as urgent for DOT.  We will follow up after the study either by recommending treatment first or via phone conference discussing which treatment the patient would prefer if several options are available.   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  On the days he does not drive he makes up with extra nap time, usually he will get about 6 hours of sleep and daytime.  Sleep can be fragmented by nocturia 2-3 times he has changed his fluid intake and has shifted away from TV.  He may drink  2-4 carbonated or caffeinated beverages a day.  Assessment - severe OSA and severe hypoxia.   1) I will order a home ONO for him and he is 100% compliant with  CPAP -   I would like to thank [name], DO and [name], Do [address] [place],  [place] [address] for allowing me to meet with and to take care of this pleasant patient.     I plan to follow up on ONO either personally or through our NP within 60 s days, by phone - I will call the patient with results, RV in 12 months,    CC: I will share my notes with PCP/ DOT.  Electronically signed by: Melvyn Novas, MD 12/07/2020 8:46  AM  [facility] [employer] certified by [employer] of Sleep Medicine and Diplomate of the [employer] of Sleep Medicine. Board certified In Neurology through the [employer], Fellow of the [employer] of Neurology. Medical Director of [employer].

## 2020-12-07 NOTE — Patient Instructions (Signed)
Pulseoximetry-   I ordered a test while you are using CPAP for one night- this is just to make sure the HST result of low oxygen has been sufficiently corrected.   Melvyn Novas, MD

## 2020-12-21 ENCOUNTER — Ambulatory Visit: Payer: Self-pay | Admitting: Neurology

## 2021-01-25 ENCOUNTER — Ambulatory Visit (INDEPENDENT_AMBULATORY_CARE_PROVIDER_SITE_OTHER): Payer: 59 | Admitting: Family Medicine

## 2021-01-25 ENCOUNTER — Encounter: Payer: Self-pay | Admitting: Family Medicine

## 2021-01-25 ENCOUNTER — Other Ambulatory Visit: Payer: Self-pay

## 2021-01-25 VITALS — BP 116/74 | HR 62 | Temp 98.4°F | Ht 70.0 in | Wt 249.8 lb

## 2021-01-25 DIAGNOSIS — G4733 Obstructive sleep apnea (adult) (pediatric): Secondary | ICD-10-CM | POA: Diagnosis not present

## 2021-01-25 DIAGNOSIS — R600 Localized edema: Secondary | ICD-10-CM

## 2021-01-25 DIAGNOSIS — R7989 Other specified abnormal findings of blood chemistry: Secondary | ICD-10-CM | POA: Diagnosis not present

## 2021-01-25 DIAGNOSIS — Z23 Encounter for immunization: Secondary | ICD-10-CM | POA: Diagnosis not present

## 2021-01-25 LAB — CMP14+EGFR
ALT: 38 IU/L (ref 0–44)
AST: 24 IU/L (ref 0–40)
Albumin/Globulin Ratio: 2 (ref 1.2–2.2)
Albumin: 4.5 g/dL (ref 3.8–4.8)
Alkaline Phosphatase: 80 IU/L (ref 44–121)
BUN/Creatinine Ratio: 30 — ABNORMAL HIGH (ref 10–24)
BUN: 34 mg/dL — ABNORMAL HIGH (ref 8–27)
Bilirubin Total: 0.3 mg/dL (ref 0.0–1.2)
CO2: 26 mmol/L (ref 20–29)
Calcium: 9.2 mg/dL (ref 8.6–10.2)
Chloride: 101 mmol/L (ref 96–106)
Creatinine, Ser: 1.13 mg/dL (ref 0.76–1.27)
Globulin, Total: 2.3 g/dL (ref 1.5–4.5)
Glucose: 116 mg/dL — ABNORMAL HIGH (ref 65–99)
Potassium: 4.6 mmol/L (ref 3.5–5.2)
Sodium: 139 mmol/L (ref 134–144)
Total Protein: 6.8 g/dL (ref 6.0–8.5)
eGFR: 73 mL/min/{1.73_m2} (ref >59)

## 2021-01-25 NOTE — Progress Notes (Signed)
Subjective: CC: Obesity, hypertension, hyperlipidemia PCP: Janora Norlander, DO ATF:TDDUK Jacob Cline is a 60 y.o. male presenting to clinic today for:  1.  Hypertension with hyperlipidemia associated with morbid obesity Patient is compliant with my card is 40 mg daily, triamterene-hydrochlorothiazide 37.5-25 mg daily, Pravachol 40 mg daily.  Noted to have elevated liver function tests at last visit.  He takes Tylenol arthritis 2 tablets twice daily most days in efforts to control his low back pain.  He is currently enrolled in physical therapy as well.  Denies any alcohol use.  No chest pain, shortness of breath.  He has some edema in the lower extremities occasionally and this seems to be most prevalent after he is driven.  He is worried about some discoloration on his shins and inquires about this today.  Has tried some of the over-the-counter compression hose.  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  The little time that he does have for himself at home he tries to spend with his family, eating home-cooked meal and wash up.     2.  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 Brett Fairy.  Apparently a CPAP test was ordered to evaluate his low oxygen further.  However, he notes that he has not received any correspondence any further about this.  ROS: Per HPI  Allergies  Allergen Reactions   Other Anaphylaxis    Make me hurt alot   Codeine     Unknown    Lovastatin     Makes me hurt alot   Niaspan [Niacin Er]     Make me hurt alot   Past Medical History:  Diagnosis Date   BPH (benign prostatic hypertrophy)    Hemorrhoids    Hiatal hernia    Hyperlipidemia    Hypertension    Irregular heart beat    Obesity    Post-operative nausea and vomiting     Current Outpatient Medications:    acetaminophen (TYLENOL) 500 MG  tablet, Take by mouth., Disp: , Rfl:    aspirin 81 MG chewable tablet, Chew 81 mg by mouth daily., Disp: , Rfl:    cholecalciferol (VITAMIN D) 1000 UNITS tablet, Take 1,000 Units by mouth daily. 3 tablets daily, Disp: , Rfl:    Cinnamon 500 MG capsule, Take 1,000 mg by mouth daily. , Disp: , Rfl:    diclofenac (VOLTAREN) 75 MG EC tablet, Take 1 tablet (75 mg total) by mouth 2 (two) times daily., Disp: 180 tablet, Rfl: 0   gabapentin (NEURONTIN) 300 MG capsule, Take 300 mg by mouth at bedtime., Disp: , Rfl:    magnesium oxide (MAG-OX) 400 MG tablet, Take 400 mg by mouth daily., Disp: , Rfl:    Multiple Vitamin (MULTIVITAMIN) tablet, Take 1 tablet by mouth daily. , Disp: , Rfl:    Omega-3 Fatty Acids (FISH OIL) 1000 MG CAPS, Take 1 capsule by mouth daily. , Disp: , Rfl:    omeprazole (PRILOSEC) 20 MG capsule, TAKE 1 CAPSULE BY MOUTH TWICE DAILY BEFORE MEAL(S), Disp: 180 capsule, Rfl: 3   PARoxetine (PAXIL-CR) 25 MG 24 hr tablet, Take 1 tablet (25 mg total) by mouth daily., Disp: 90 tablet, Rfl: 3   pravastatin (PRAVACHOL) 40 MG tablet, Take 1 tablet (40 mg total) by mouth at bedtime., Disp: 90 tablet, Rfl: 3  telmisartan (MICARDIS) 40 MG tablet, Take 1 tablet (40 mg total) by mouth daily., Disp: 90 tablet, Rfl: 3   triamterene-hydrochlorothiazide (MAXZIDE-25) 37.5-25 MG tablet, Take 1 tablet by mouth daily., Disp: 90 tablet, Rfl: 3   TURMERIC PO, Take 1 capsule by mouth daily. , Disp: , Rfl:  Social History   Socioeconomic History   Marital status: Married    Spouse name: Not on file   Number of children: Not on file   Years of education: Not on file   Highest education level: Not on file  Occupational History   Occupation: Full time  Tobacco Use   Smoking status: Never   Smokeless tobacco: Never  Vaping Use   Vaping Use: Never used  Substance and Sexual Activity   Alcohol use: No    Alcohol/week: 0.0 standard drinks   Drug use: No   Sexual activity: Not on file  Other Topics  Concern   Not on file  Social History Narrative   Not on file   Social Determinants of Health   Financial Resource Strain: Not on file  Food Insecurity: Not on file  Transportation Needs: Not on file  Physical Activity: Not on file  Stress: Not on file  Social Connections: Not on file  Intimate Partner Violence: Not on file   Family History  Problem Relation Age of Onset   Diabetes Mother    Scleroderma Sister    Diabetes Brother    Diabetes Brother    Colon cancer Other        family history    Prostate cancer Other        family history     Objective: Office vital signs reviewed. BP 116/74   Pulse 62   Temp 98.4 F (36.9 C)   Ht 5' 10" (1.778 m)   Wt 249 lb 12.8 oz (113.3 kg)   SpO2 98%   BMI 35.84 kg/m   Physical Examination:  General: Awake, alert, morbidly obese, No acute distress HEENT: Normal; sclera white Cardio: regular rate and rhythm, S1S2 heard, no murmurs appreciated Pulm: clear to auscultation bilaterally, no wheezes, rhonchi or rales; normal work of breathing on room air Extremities: warm, well perfused, trace ankle edema, no cyanosis or clubbing; venous stasis changes to the skin on bilateral shins MSK: Ambulating independently  Assessment/ Plan: 60 y.o. male   Elevated liver function tests - Plan: CMP14+EGFR, CANCELED: Hepatic Function Panel  Bilateral lower extremity edema - Plan: Compression stockings  Morbid obesity (HCC)  Severe obstructive sleep apnea  Need for immunization against influenza - Plan: Flu Vaccine QUAD 68moIM (Fluarix, Fluzone & Alfiuria Quad PF)  Check hepatic function panel given elevation in liver function tests.  We will also check renal function given reports of protein in urine on DOT physical.  Previous GFR was within normal range  Prescription for compression stockings provided to patient.  We discussed that the discoloration on his skin was hemosiderin deposition in the setting of venous stasis  dermatitis.  We had a frank discussion about his obesity today and its impact on overall health.  I have given him healthy weight and wellness information and encouraged him to look into them for lifestyle modification assistance.  We also alternatively discussed Saxenda and [facility] interventions for weight loss.  Was under the impression that he was getting some type of additional intervention for his CPAP machine but has not heard back from sleep medicine about this.  I will CC his chart to Dr.  Dohmeier to inquire for him  Influenza vaccination administered on today's visit  No orders of the defined types were placed in this encounter.  No orders of the defined types were placed in this encounter.    Janora Norlander, DO [place] [phone]

## 2021-01-29 ENCOUNTER — Encounter: Payer: Self-pay | Admitting: Neurology

## 2021-02-01 DIAGNOSIS — Z0289 Encounter for other administrative examinations: Secondary | ICD-10-CM

## 2021-02-03 ENCOUNTER — Telehealth: Payer: Self-pay | Admitting: Neurology

## 2021-02-03 DIAGNOSIS — G4733 Obstructive sleep apnea (adult) (pediatric): Secondary | ICD-10-CM

## 2021-02-03 DIAGNOSIS — Z9989 Dependence on other enabling machines and devices: Secondary | ICD-10-CM

## 2021-02-03 DIAGNOSIS — R0902 Hypoxemia: Secondary | ICD-10-CM

## 2021-02-03 NOTE — Telephone Encounter (Signed)
Received the report on the patient where a overnight oximetry was completed while using the CPAP. There was a recorded time of 7 hours 23 minutes.  During the study there was recorded 3 hours 41 min and 36 seconds where the oxygen was spent below 89%.   I will have our sleep work in [place] review the download and result and then inform the patient of the results and next step.

## 2021-02-04 ENCOUNTER — Ambulatory Visit (INDEPENDENT_AMBULATORY_CARE_PROVIDER_SITE_OTHER): Payer: 59 | Admitting: Bariatrics

## 2021-02-04 ENCOUNTER — Other Ambulatory Visit: Payer: Self-pay

## 2021-02-04 ENCOUNTER — Encounter (INDEPENDENT_AMBULATORY_CARE_PROVIDER_SITE_OTHER): Payer: Self-pay | Admitting: Bariatrics

## 2021-02-04 VITALS — BP 127/78 | HR 60 | Temp 98.0°F | Ht 68.0 in | Wt 247.0 lb

## 2021-02-04 DIAGNOSIS — K760 Fatty (change of) liver, not elsewhere classified: Secondary | ICD-10-CM

## 2021-02-04 DIAGNOSIS — I10 Essential (primary) hypertension: Secondary | ICD-10-CM | POA: Diagnosis not present

## 2021-02-04 DIAGNOSIS — Z1331 Encounter for screening for depression: Secondary | ICD-10-CM | POA: Diagnosis not present

## 2021-02-04 DIAGNOSIS — Z6837 Body mass index (BMI) 37.0-37.9, adult: Secondary | ICD-10-CM

## 2021-02-04 DIAGNOSIS — E7849 Other hyperlipidemia: Secondary | ICD-10-CM

## 2021-02-04 DIAGNOSIS — G4733 Obstructive sleep apnea (adult) (pediatric): Secondary | ICD-10-CM

## 2021-02-04 DIAGNOSIS — R5383 Other fatigue: Secondary | ICD-10-CM

## 2021-02-04 DIAGNOSIS — Z9189 Other specified personal risk factors, not elsewhere classified: Secondary | ICD-10-CM | POA: Diagnosis not present

## 2021-02-04 DIAGNOSIS — R0602 Shortness of breath: Secondary | ICD-10-CM

## 2021-02-04 DIAGNOSIS — R7309 Other abnormal glucose: Secondary | ICD-10-CM

## 2021-02-04 DIAGNOSIS — E559 Vitamin D deficiency, unspecified: Secondary | ICD-10-CM

## 2021-02-04 DIAGNOSIS — Z9989 Dependence on other enabling machines and devices: Secondary | ICD-10-CM

## 2021-02-04 NOTE — Telephone Encounter (Signed)
Called the patient and reviewed the ono on cpap findings. Advised that based off this report it showed that his oxygen was below 89% for a significant amount of time.  After discussing with the DME company to confirm, at this time the patient could get insurance coverage for oxygen to be bled into the CPAP based off of the results from this ONO.  Advised an alternative option for him would be bringing him in for a titration study to titrate him on oxygen.  Advised 63 when he changes to Medicare, in order to get insurance authorization for oxygen at that time will require a overnight sleep study here in the lab where we titrate the oxygen.  Since this is only 3 years away I wanted to go ahead and give him the option on whether he would like to pursue the overnights that he now we are have Dr. Vickey Huger order oxygen off of the ono.  After answering questions in regards to his oxygen level, the patient has decided to go ahead and have Dr. Vickey Huger order the oxygen.  Advised I would place an order for oxygen to be bled into the CPAP.  We will repeat an overnight oximetry test once he is on oxygen to make sure that particular level is treating effectively.  Patient verbalized understanding of the results and at this time had no further questions.  Advised that [facility] will be in touch to get him set up.

## 2021-02-04 NOTE — Progress Notes (Signed)
Chief Complaint:   OBESITY Jacob Cline (MR# [id number]) is a 60 y.o. male who presents for evaluation and treatment of obesity and related comorbidities. Current BMI is Body mass index is 37.56 kg/m. Jacob Cline has been struggling with his weight for many years and has been unsuccessful in either losing weight, maintaining weight loss, or reaching his healthy weight goal.  Jacob Cline is currently in the action stage of change and ready to dedicate time achieving and maintaining a healthier weight. Jacob Cline is interested in becoming our patient and working on intensive lifestyle modifications including (but not limited to) diet and exercise for weight loss.  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  Jacob Cline's habits were reviewed today and are as follows: His family eats meals together, he thinks his family will eat healthier with him, his desired weight loss is 30 pounds, he started gaining excessive weight in his 30s, his heaviest weight ever was his current weight, he snacks frequently in the evenings, he skips breakfast on the weekends, he is frequently drinking liquids with calories, he frequently makes poor food choices, he has problems with excessive hunger, he frequently eats larger portions than normal, and he struggles with emotional eating.  Depression Screen Jacob Cline's Food and Mood (modified PHQ-9) score was 1.  Depression screen Jacob Cline 2/9 02/04/2021  Decreased Interest 0  Down, Depressed, Hopeless 0  PHQ - 2 Score 0  Altered sleeping 0  Tired, decreased energy 1  Change in appetite 0  Feeling bad or failure about yourself  0  Trouble concentrating 0  Moving slowly or fidgety/restless 0  Suicidal thoughts 0  PHQ-9 Score 1  Difficult doing work/chores Not difficult at all  Some recent data might be hidden   Subjective:   1. Other fatigue Jacob Cline admits to daytime somnolence and reports waking up still tired. Patent has a history of symptoms of daytime fatigue, morning fatigue,  and snoring. Jacob Cline generally gets 6 hours of sleep per night, and states that he has poor quality sleep. Snoring is present. Apneic episodes are present. Epworth Sleepiness Score is 9.  Occurs with certain activities.   2. SOB (shortness of breath) on exertion Jacob Cline notes increasing shortness of breath with exercising and seems to be worsening over time with weight gain. He notes getting out of breath sooner with activity than he used to. This has gotten worse recently. Jacob Cline denies shortness of breath at rest or orthopnea.  Occurs with certain activities.  3. Essential hypertension Well controlled.  Review: taking medications as instructed, no medication side effects noted, no chest pain on exertion, no dyspnea on exertion, no swelling of ankles.    BP Readings from Last 3 Encounters:  01/25/21 116/74  12/07/20 114/76  11/23/20 112/72   4. OSA on CPAP Jacob Cline has a diagnosis of sleep apnea. He reports that he is using a CPAP regularly.   5. Vitamin D deficiency He is currently taking OTC vitamin D each day. He denies nausea, vomiting or muscle weakness.  Lab Results  Component Value Date   VD25OH 36.6 10/19/2020   VD25OH 31.6 02/11/2019   VD25OH 39.1 07/02/2018   6. Other hyperlipidemia Jacob Cline has hyperlipidemia and has been trying to improve his cholesterol levels with intensive lifestyle modification including a low saturated fat diet, exercise and weight loss. He denies any chest pain, claudication or myalgias.  Takes pravastatin.  Lab Results  Component Value Date   ALT 38 01/25/2021   AST 24  01/25/2021   ALKPHOS 80 01/25/2021   BILITOT 0.3 01/25/2021   Lab Results  Component Value Date   CHOL 124 10/19/2020   HDL 45 10/19/2020   LDLCALC 63 10/19/2020   TRIG 83 10/19/2020   CHOLHDL 2.8 10/19/2020   7. Fatty liver Had recent CMP.  8. Elevated glucose Positive family history of diabetes type 2.  9. Depression screen Jacob Cline was screened for depression as part of his  new patient workup today.  PHQ-9 is 1.  10. At risk for activity intolerance Jacob Cline is at risk for activity intolerance Jacob Cline to obesity.  Assessment/Plan:   1. Other fatigue Jacob Cline does feel that his weight is causing his energy to be lower than it should be. Fatigue may be related to obesity, depression or many other causes. Labs will be ordered, and in the meanwhile, Jacob Cline will focus on self care including making healthy food choices, increasing physical activity and focusing on stress reduction.  Gradually increase activities.  Will check thyroid panel today.  - EKG 12-Lead - T3 - T4, free - TSH  2. SOB (shortness of breath) on exertion Jacob Cline does feel that he gets out of breath more easily that he used to when he exercises. Jacob Cline's shortness of breath appears to be obesity related and exercise induced. He has agreed to work on weight loss and gradually increase exercise to treat his exercise induced shortness of breath. Will continue to monitor closely.  Gradually increase activities.  Will check thyroid panel today.  - T3 - T4, free  3. Essential hypertension Jacob Cline is working on healthy weight loss and exercise to improve blood pressure control. We will watch for signs of hypotension as he continues his lifestyle modifications.  Continue medications.  - Comprehensive metabolic panel  4. OSA on CPAP Will continue CPAP.  Intensive lifestyle modifications are the first line treatment for this issue. We discussed several lifestyle modifications today and he will continue to work on diet, exercise and weight loss efforts. We will continue to monitor. Orders and follow up as documented in patient record.   5. Vitamin D deficiency Will check vitamin D level today, as per below.  - VITAMIN D 25 Hydroxy (Vit-D Deficiency, Fractures)  6. Other hyperlipidemia Cardiovascular risk and specific lipid/LDL goals reviewed.  We discussed several lifestyle modifications today and Jacob Cline will  continue to work on diet, exercise and weight loss efforts. Orders and follow up as documented in patient record. Will check lipid panel today.  Counseling Intensive lifestyle modifications are the first line treatment for this issue. Dietary changes: Increase soluble fiber. Decrease simple carbohydrates. Exercise changes: Moderate to vigorous-intensity aerobic activity 150 minutes per week if tolerated. Lipid-lowering medications: see documented in medical record.  - Comprehensive metabolic panel - Lipid Panel With LDL/HDL Ratio  7. Fatty liver PCP will follow.  Work on plan and exercise.  8. Elevated glucose Will check A1c and insulin level today.  - Hemoglobin A1c - Insulin, random - Comprehensive metabolic panel  9. Depression screen Depression screen is negative.  10. At risk for activity intolerance Jacob Cline was given approximately 15 minutes of exercise intolerance counseling today. He is 60 y.o. male and has risk factors exercise intolerance including obesity. We discussed intensive lifestyle modifications today with an emphasis on specific weight loss instructions and strategies. Dominic will slowly increase activity as tolerated.  Repetitive spaced learning was employed today to elicit superior memory formation and behavioral change.   11. Class 2 severe obesity with serious comorbidity  and body mass index (BMI) of 37.0 to 37.9 in adult, unspecified obesity type Jacob Cline)  [place] is currently in the action stage of change and his goal is to continue with weight loss efforts. I recommend Jhase begin the structured treatment plan as follows:  He has agreed to the Category 3 Plan and keeping a food journal and adhering to recommended goals of 1500 calories and 90 grams of protein.  He will work on meal planing, intentional eating.  We reviewed his labs from 01/25/2021, including CMP, globulin, glucose.  Exercise goals: No exercise has been prescribed at this time.   Behavioral  modification strategies: increasing lean protein intake, decreasing simple carbohydrates, increasing vegetables, increasing water intake, decreasing eating out, no skipping meals, meal planning and cooking strategies, keeping healthy foods in the home, and planning for success.  He was informed of the importance of frequent follow-up visits to maximize his success with intensive lifestyle modifications for his multiple health conditions. He was informed we would discuss his lab results at his next visit unless there is a critical issue that needs to be addressed sooner. Karder agreed to keep his next visit at the agreed upon time to discuss these results.  Objective:   Pulse 60, temperature 98 F (36.7 Jacob Cline), height 5\' 8"  (1.727 m), weight 247 lb (112 kg), SpO2 94 %. Body mass index is 37.56 kg/m.  EKG: Normal sinus rhythm, rate 58 bpm.  Indirect Calorimeter completed today shows a VO2 of 301 and a REE of 2074.  His calculated basal metabolic rate is 2075 thus his basal metabolic rate is worse than expected.  General: Cooperative, alert, well developed, in no acute distress. HEENT: Conjunctivae and lids unremarkable. Cardiovascular: Regular rhythm.  Lungs: Normal work of breathing. Neurologic: No focal deficits.   Lab Results  Component Value Date   CREATININE 1.13 01/25/2021   BUN 34 (H) 01/25/2021   NA 139 01/25/2021   K 4.6 01/25/2021   CL 101 01/25/2021   CO2 26 01/25/2021   Lab Results  Component Value Date   ALT 38 01/25/2021   AST 24 01/25/2021   ALKPHOS 80 01/25/2021   BILITOT 0.3 01/25/2021   Lab Results  Component Value Date   HGBA1C 5.9 02/10/2020   HGBA1C 6.0 06/10/2019   HGBA1C 5.7 12/18/2017   HGBA1C 5.7 10/25/2013   Lab Results  Component Value Date   TSH 1.540 10/19/2020   Lab Results  Component Value Date   CHOL 124 10/19/2020   HDL 45 10/19/2020   LDLCALC 63 10/19/2020   TRIG 83 10/19/2020   CHOLHDL 2.8 10/19/2020   Lab Results  Component Value  Date   WBC 7.5 10/19/2020   HGB 15.1 10/19/2020   HCT 44.8 10/19/2020   MCV 89 10/19/2020   PLT 191 10/19/2020   Attestation Statements:   Reviewed by clinician on day of visit: allergies, medications, problem list, medical history, surgical history, family history, social history, and previous encounter notes.  I, 10/21/2020, CMA, am acting as Insurance claims handler for Energy manager, DO  I have reviewed the above documentation for accuracy and completeness, and I agree with the above. -[employer], DO

## 2021-02-04 NOTE — Addendum Note (Signed)
Addended by: Judi Cong on: 02/04/2021 10:28 AM   Modules accepted: Orders

## 2021-02-05 LAB — COMPREHENSIVE METABOLIC PANEL
ALT: 35 IU/L (ref 0–44)
AST: 31 IU/L (ref 0–40)
Albumin/Globulin Ratio: 1.6 (ref 1.2–2.2)
Albumin: 4.5 g/dL (ref 3.8–4.8)
Alkaline Phosphatase: 93 IU/L (ref 44–121)
BUN/Creatinine Ratio: 26 — ABNORMAL HIGH (ref 10–24)
BUN: 24 mg/dL (ref 8–27)
Bilirubin Total: 0.4 mg/dL (ref 0.0–1.2)
CO2: 23 mmol/L (ref 20–29)
Calcium: 9.3 mg/dL (ref 8.6–10.2)
Chloride: 97 mmol/L (ref 96–106)
Creatinine, Ser: 0.93 mg/dL (ref 0.76–1.27)
Globulin, Total: 2.8 g/dL (ref 1.5–4.5)
Glucose: 108 mg/dL — ABNORMAL HIGH (ref 70–99)
Potassium: 4.7 mmol/L (ref 3.5–5.2)
Sodium: 138 mmol/L (ref 134–144)
Total Protein: 7.3 g/dL (ref 6.0–8.5)
eGFR: 93 mL/min/{1.73_m2} (ref >59)

## 2021-02-05 LAB — LIPID PANEL WITH LDL/HDL RATIO
Cholesterol, Total: 157 mg/dL (ref 100–199)
HDL: 41 mg/dL (ref >39)
LDL Chol Calc (NIH): 91 mg/dL (ref 0–99)
LDL/HDL Ratio: 2.2 ratio (ref 0.0–3.6)
Triglycerides: 139 mg/dL (ref 0–149)
VLDL Cholesterol Cal: 25 mg/dL (ref 5–40)

## 2021-02-05 LAB — TSH: TSH: 1.12 u[IU]/mL (ref 0.450–4.500)

## 2021-02-05 LAB — VITAMIN D 25 HYDROXY (VIT D DEFICIENCY, FRACTURES): Vit D, 25-Hydroxy: 37.8 ng/mL (ref 30.0–100.0)

## 2021-02-05 LAB — HEMOGLOBIN A1C
Est. average glucose Bld gHb Est-mCnc: 126 mg/dL
Hgb A1c MFr Bld: 6 % — ABNORMAL HIGH (ref 4.8–5.6)

## 2021-02-05 LAB — T3: T3, Total: 101 ng/dL (ref 71–180)

## 2021-02-05 LAB — INSULIN, RANDOM: INSULIN: 22 u[IU]/mL (ref 2.6–24.9)

## 2021-02-05 LAB — T4, FREE: Free T4: 1.01 ng/dL (ref 0.82–1.77)

## 2021-02-08 ENCOUNTER — Encounter (INDEPENDENT_AMBULATORY_CARE_PROVIDER_SITE_OTHER): Payer: Self-pay | Admitting: Bariatrics

## 2021-02-08 DIAGNOSIS — R7303 Prediabetes: Secondary | ICD-10-CM | POA: Insufficient documentation

## 2021-02-08 NOTE — Telephone Encounter (Signed)
Called the patient back and he just wanted to question the oxygen level concerns once more. I reviewed with the patient that sometimes there are moments when the oxygen level drops and is unable to be fully treated with CPAP. This is what happened in this circumstance cpap alone was not treating his oxygen level. Advised not uncommon to have this. Pt verbalized understanding.

## 2021-02-08 NOTE — Telephone Encounter (Signed)
Pt called states he has some questions about the oxygen says they are coming out today to set it up. Pt requesting a call back before 2:30 pm.

## 2021-02-22 ENCOUNTER — Ambulatory Visit (INDEPENDENT_AMBULATORY_CARE_PROVIDER_SITE_OTHER): Payer: 59 | Admitting: Bariatrics

## 2021-02-22 ENCOUNTER — Telehealth: Payer: Self-pay | Admitting: Neurology

## 2021-02-22 NOTE — Telephone Encounter (Signed)
PS : No in-lab  oxygen titration capacity until December 2022 at [facility].

## 2021-02-22 NOTE — Telephone Encounter (Signed)
Please get patient set up for ambulatory vitals/ O2 for O2 to be prescribed with CPAP.  Patient saw Dr Vickey Huger for CPAP recently.   Lynden Ang, would it be easier to have Dominigue Gellner with [employer] take care of this?

## 2021-02-22 NOTE — Telephone Encounter (Signed)
Pt needs a Nurse/triage visit for "O2 sitting, standing, walking" and the nurse note needs to go to [place] and CC Dr Vickey Huger.   LM for pt to call for this appt

## 2021-02-22 NOTE — Telephone Encounter (Signed)
  Dear Dr, Nadine Counts, Mr. 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  On 12-07-2020 we followed up on his HST, from April 22, and he had been treated with auto -CPAP upon the results. He had a very severe form of apnea with an AHI of 53.7 and his pulse range was between 47 and 103 bpm.  62 minutes were under 90% oxygen saturation with a nadir of oxygenation at 57%.  I had ordered an overnight pulse-oximetry on CPAP to investigate if CPAP treated the sleep hypoxia noted by this HST.   The results suggest that this is not the case.   His time in hypoxia while using CPAP during ONO on 01-29-2021 was 2 hours and 19 minutes , and is graded as GROUP 1, qualified to have oxygen supplement.   For our sleep lab, qualification has been difficult - we have to invite the patient back to titrate to oxygen while on CPAP here in the lab.  Alternatively , the patient could be seen by internal medicine for a walking oxygen test, which can qualify him for 02.   Any preferences?  Melvyn Novas, MD  Cc Irene Pap, RPSGT, Sleep laboratory manager

## 2021-02-23 ENCOUNTER — Other Ambulatory Visit: Payer: Self-pay

## 2021-02-23 ENCOUNTER — Ambulatory Visit: Payer: 59

## 2021-02-23 DIAGNOSIS — R0902 Hypoxemia: Secondary | ICD-10-CM

## 2021-02-23 DIAGNOSIS — G4734 Idiopathic sleep related nonobstructive alveolar hypoventilation: Secondary | ICD-10-CM

## 2021-02-23 NOTE — Addendum Note (Signed)
Addended by: [name] on: 02/23/2021 03:38 PM   Modules accepted: Orders

## 2021-02-23 NOTE — Progress Notes (Signed)
Patient is here today to have oxygen tested for Cpap/Oxygen at bedtime.  Patient's oxygen level while sitting in triage O2 level was 94%.  Patient was walked around the building without oxygen and  O2 level dropped to 84%.  Oxygen was then applied to patient and we continued to walk.  O2 level while walking with oxygen came back up to 93%.

## 2021-02-23 NOTE — Telephone Encounter (Signed)
Appointment scheduled for 02/23/2021 with nurse triage

## 2021-02-26 ENCOUNTER — Other Ambulatory Visit: Payer: Self-pay | Admitting: Family Medicine

## 2021-02-26 DIAGNOSIS — R0902 Hypoxemia: Secondary | ICD-10-CM

## 2021-02-26 DIAGNOSIS — G4734 Idiopathic sleep related nonobstructive alveolar hypoventilation: Secondary | ICD-10-CM

## 2021-03-01 ENCOUNTER — Other Ambulatory Visit: Payer: Self-pay

## 2021-03-01 ENCOUNTER — Encounter (INDEPENDENT_AMBULATORY_CARE_PROVIDER_SITE_OTHER): Payer: Self-pay | Admitting: Bariatrics

## 2021-03-01 ENCOUNTER — Ambulatory Visit (INDEPENDENT_AMBULATORY_CARE_PROVIDER_SITE_OTHER): Payer: 59 | Admitting: Bariatrics

## 2021-03-01 VITALS — BP 118/74 | HR 53 | Temp 97.5°F | Ht 68.0 in | Wt 245.0 lb

## 2021-03-01 DIAGNOSIS — E559 Vitamin D deficiency, unspecified: Secondary | ICD-10-CM

## 2021-03-01 DIAGNOSIS — Z6837 Body mass index (BMI) 37.0-37.9, adult: Secondary | ICD-10-CM | POA: Diagnosis not present

## 2021-03-01 DIAGNOSIS — R7303 Prediabetes: Secondary | ICD-10-CM | POA: Diagnosis not present

## 2021-03-01 MED ORDER — VITAMIN D (ERGOCALCIFEROL) 1.25 MG (50000 UNIT) PO CAPS: 50000.0000 [IU] | ORAL_CAPSULE | ORAL | 0 refills | Status: DC

## 2021-03-01 NOTE — Progress Notes (Signed)
Chief Complaint:   OBESITY Jacob Cline is here to discuss his progress with his obesity treatment plan along with follow-up of his obesity related diagnoses. Jacob Cline is on the Category 3 Plan or keeping a food journal and adhering to recommended goals of 1500 calories and 90 grams of protein daily and states he is following his eating plan approximately 90% of the time. Jacob Cline states he is doing 0 minutes 0 times per week.  Today's visit was #: 2 Starting weight: 247 lbs Starting date: 02/04/2021 Today's weight: 245 lbs Today's date: 03/01/2021 Total lbs lost to date: 2 Total lbs lost since last in-office visit: 2  Interim History: Jacob Cline is down 2 lbs since his last visit. He states that it was going well, but he had some stress this last week.  Subjective:   1. Pre-diabetes Jacob Cline is not on medications, and his last A1c was 6.0 and insulin 22.0.  2. Vitamin D insufficiency Jacob Cline is taking multivitamins, OTC Vit D, and fish oil. His last Vit D level was 37.8.  Assessment/Plan:   1. Pre-diabetes Jacob Cline will continue to work on weight loss, exercise, and decreasing simple carbohydrates to help decrease the risk of diabetes. Handouts were given on insulin resistance and pre-diabetes.  2. Vitamin D insufficiency Jacob Cline Vitamin D level contributes to fatigue and are associated with obesity, breast, and colon cancer. Jacob Cline agreed to start prescription Vitamin D 50,000 IU every week with no refills. He will follow-up for routine testing of Vitamin D, at least 2-3 times per year to avoid over-replacement.  - Vitamin D, Ergocalciferol, (DRISDOL) 1.25 MG (50000 UNIT) CAPS capsule; Take 1 capsule (50,000 Units total) by mouth every 7 (seven) days.  Dispense: 4 capsule; Refill: 0  3. Obesity, current BMI 37.3 Jacob Cline is currently in the action stage of change. As such, his goal is to continue with weight loss efforts. He has agreed to the Category 3 Plan or keeping a food journal and adhering to  recommended goals of 1500 calories and 90 grams of protein daily.   I reviewed labs from 02/04/2021 with the patient today. Protein equivalents handout was given.  Exercise goals: No exercise has been prescribed at this time.  Behavioral modification strategies: increasing lean protein intake, decreasing simple carbohydrates, increasing vegetables, increasing water intake, decreasing eating out, no skipping meals, meal planning and cooking strategies, keeping healthy foods in the home, and planning for success.  Jacob Cline has agreed to follow-up with our clinic in 2 weeks. He was informed of the importance of frequent follow-up visits to maximize his success with intensive lifestyle modifications for his multiple health conditions.   Objective:   Blood pressure 118/74, pulse (!) 53, temperature (!) 97.5 F (36.4 C), height 5\' 8"  (1.727 m), weight 245 lb (111.1 kg), SpO2 96 %. Body mass index is 37.25 kg/m.  General: Cooperative, alert, well developed, in no acute distress. HEENT: Conjunctivae and lids unremarkable. Cardiovascular: Regular rhythm.  Lungs: Normal work of breathing. Neurologic: No focal deficits.   Lab Results  Component Value Date   CREATININE 0.93 02/04/2021   BUN 24 02/04/2021   NA 138 02/04/2021   K 4.7 02/04/2021   CL 97 02/04/2021   CO2 23 02/04/2021   Lab Results  Component Value Date   ALT 35 02/04/2021   AST 31 02/04/2021   ALKPHOS 93 02/04/2021   BILITOT 0.4 02/04/2021   Lab Results  Component Value Date   HGBA1C 6.0 (H) 02/04/2021   HGBA1C 5.9 02/10/2020  HGBA1C 6.0 06/10/2019   HGBA1C 5.7 12/18/2017   HGBA1C 5.7 10/25/2013   Lab Results  Component Value Date   INSULIN 22.0 02/04/2021   Lab Results  Component Value Date   TSH 1.120 02/04/2021   Lab Results  Component Value Date   CHOL 157 02/04/2021   HDL 41 02/04/2021   LDLCALC 91 02/04/2021   TRIG 139 02/04/2021   CHOLHDL 2.8 10/19/2020   Lab Results  Component Value Date    VD25OH 37.8 02/04/2021   VD25OH 36.6 10/19/2020   VD25OH 31.6 02/11/2019   Lab Results  Component Value Date   WBC 7.5 10/19/2020   HGB 15.1 10/19/2020   HCT 44.8 10/19/2020   MCV 89 10/19/2020   PLT 191 10/19/2020   No results found for: IRON, TIBC, FERRITIN  Attestation Statements:   Reviewed by clinician on day of visit: allergies, medications, problem list, medical history, surgical history, family history, social history, and previous encounter notes.   Trude Mcburney, am acting as Energy manager for [employer], DO.  I have reviewed the above documentation for accuracy and completeness, and I agree with the above. Corinna Capra, DO

## 2021-03-03 ENCOUNTER — Other Ambulatory Visit: Payer: Self-pay | Admitting: Family Medicine

## 2021-03-03 DIAGNOSIS — M7741 Metatarsalgia, right foot: Secondary | ICD-10-CM

## 2021-03-17 ENCOUNTER — Encounter: Payer: Self-pay | Admitting: Family Medicine

## 2021-03-17 ENCOUNTER — Ambulatory Visit (INDEPENDENT_AMBULATORY_CARE_PROVIDER_SITE_OTHER): Payer: 59 | Admitting: Family Medicine

## 2021-03-17 DIAGNOSIS — J329 Chronic sinusitis, unspecified: Secondary | ICD-10-CM

## 2021-03-17 DIAGNOSIS — Z9989 Dependence on other enabling machines and devices: Secondary | ICD-10-CM

## 2021-03-17 DIAGNOSIS — J40 Bronchitis, not specified as acute or chronic: Secondary | ICD-10-CM | POA: Diagnosis not present

## 2021-03-17 DIAGNOSIS — G4733 Obstructive sleep apnea (adult) (pediatric): Secondary | ICD-10-CM

## 2021-03-17 DIAGNOSIS — G4734 Idiopathic sleep related nonobstructive alveolar hypoventilation: Secondary | ICD-10-CM

## 2021-03-17 MED ORDER — AMOXICILLIN-POT CLAVULANATE 875-125 MG PO TABS: 1.0000 | ORAL_TABLET | Freq: Two times a day (BID) | ORAL | 0 refills | Status: DC

## 2021-03-17 NOTE — Progress Notes (Signed)
Subjective:    Patient ID: Jacob Cline, male    DOB: [birth year], 60 y.o.   MRN: [id number]   HPI: Jacob Cline is a 60 y.o. male presenting for Patient presents with upper respiratory congestion. Rhinorrhea that is frequently purulent. There is moderate sore throat. Patient reports coughing frequently as well.  Yellow to brown  sputum noted. There is 100.8 degree fever, with chills, sweats. The patient denies being short of breath. O2 sat is 96-97. Onset was 5 days ago. Gradually worsening. Tried OTCs with minimal improvement. Two home tests for Covid were negative. Noted that he uses CPAP and nocturnal O2 @ 2 liters as well.  .   Depression screen [facility] 2/9 02/04/2021 01/25/2021 10/19/2020 07/13/2020 02/10/2020  Decreased Interest 0 0 0 0 0  Down, Depressed, Hopeless 0 0 0 0 0  PHQ - 2 Score 0 0 0 0 0  Altered sleeping 0 - - 0 0  Tired, decreased energy 1 - - 0 0  Change in appetite 0 - - 0 0  Feeling bad or failure about yourself  0 - - 0 0  Trouble concentrating 0 - - 0 0  Moving slowly or fidgety/restless 0 - - 0 0  Suicidal thoughts 0 - - 0 0  PHQ-9 Score 1 - - 0 0  Difficult doing work/chores Not difficult at all - - - -  Some recent data might be hidden     Relevant past medical, surgical, family and social history reviewed and updated as indicated.  Interim medical history since our last visit reviewed. Allergies and medications reviewed and updated.  ROS:  Review of Systems  Constitutional:  Negative for activity change, appetite change, chills and fever.  HENT:  Positive for congestion, postnasal drip, rhinorrhea and sinus pressure. Negative for ear discharge, ear pain, hearing loss, nosebleeds, sneezing and trouble swallowing.   Respiratory:  Negative for chest tightness and shortness of breath.   Cardiovascular:  Negative for chest pain and palpitations.  Skin:  Negative for rash.    Social History   Tobacco Use  Smoking Status Never  Smokeless Tobacco Never        Objective:     Wt Readings from Last 3 Encounters:  03/01/21 245 lb (111.1 kg)  02/04/21 247 lb (112 kg)  01/25/21 249 lb 12.8 oz (113.3 kg)     Exam deferred. Pt. Harboring due to COVID 19. Phone visit performed.   Assessment & Plan:  No diagnosis found.  Meds ordered this encounter  Medications   amoxicillin-clavulanate (AUGMENTIN) 875-125 MG tablet    Sig: Take 1 tablet by mouth 2 (two) times daily. Take all of this medication    Dispense:  20 tablet    Refill:  0    No orders of the defined types were placed in this encounter.     There are no diagnoses linked to this encounter.  Virtual Visit via telephone Note  I discussed the limitations, risks, security and privacy concerns of performing an evaluation and management service by telephone and the availability of in person appointments. The patient was identified with two identifiers. Pt.expressed understanding and agreed to proceed. Pt. Is at home. Dr. Darlyn Read is in his office.  Follow Up Instructions:   I discussed the assessment and treatment plan with the patient. The patient was provided an opportunity to ask questions and all were answered. The patient agreed with the plan and demonstrated an understanding of the instructions.  The patient was advised to call back or seek an in-person evaluation if the symptoms worsen or if the condition fails to improve as anticipated.   Total minutes including chart review and phone contact time: 13   Follow up plan: No follow-ups on file.  Mechele Claude, MD [facility]

## 2021-03-22 ENCOUNTER — Ambulatory Visit (INDEPENDENT_AMBULATORY_CARE_PROVIDER_SITE_OTHER): Payer: 59 | Admitting: Bariatrics

## 2021-03-22 ENCOUNTER — Encounter (INDEPENDENT_AMBULATORY_CARE_PROVIDER_SITE_OTHER): Payer: Self-pay | Admitting: Bariatrics

## 2021-03-22 ENCOUNTER — Other Ambulatory Visit: Payer: Self-pay

## 2021-03-22 VITALS — BP 101/65 | HR 66 | Temp 97.8°F | Ht 68.0 in | Wt 243.0 lb

## 2021-03-22 DIAGNOSIS — Z6837 Body mass index (BMI) 37.0-37.9, adult: Secondary | ICD-10-CM | POA: Diagnosis not present

## 2021-03-22 DIAGNOSIS — E559 Vitamin D deficiency, unspecified: Secondary | ICD-10-CM

## 2021-03-22 DIAGNOSIS — R7303 Prediabetes: Secondary | ICD-10-CM | POA: Diagnosis not present

## 2021-03-22 MED ORDER — VITAMIN D (ERGOCALCIFEROL) 1.25 MG (50000 UNIT) PO CAPS: 50000.0000 [IU] | ORAL_CAPSULE | ORAL | 0 refills | Status: DC

## 2021-03-22 NOTE — Progress Notes (Signed)
Chief Complaint:   OBESITY Jacob Cline is here to discuss his progress with his obesity treatment plan along with follow-up of his obesity related diagnoses. Jacob Cline is on the Category 3 Plan and states he is following his eating plan approximately 90% of the time. Jacob Cline states he is doing 0 minutes 0 times per week.  Today's visit was #: 3 Starting weight: 247 lbs Starting date: 02/04/2021 Today's weight: 243 lbs Today's date: 03/22/2021 Total lbs lost to date: 4 lbs Total lbs lost since last in-office visit: 2 lbs  Interim History: Jacob Cline is down an additional 2 lbs since his last visit. He is getting in his water and protein. He keep his lunch packed.  Subjective:   1. Vitamin D insufficiency Jacob Cline is taking his Vitamin D as directed.  2. Pre-diabetes Jacob Cline in not on medications currently.  Assessment/Plan:   1. Vitamin D insufficiency Low Vitamin D level contributes to fatigue and are associated with obesity, breast, and colon cancer. We will refill prescription Vitamin D 50,000 IU every week for 1 month with no refills and Jacob Cline will follow-up for routine testing of Vitamin D, at least 2-3 times per year to avoid over-replacement.  - Vitamin D, Ergocalciferol, (DRISDOL) 1.25 MG (50000 UNIT) CAPS capsule; Take 1 capsule (50,000 Units total) by mouth every 7 (seven) days.  Dispense: 4 capsule; Refill: 0  2. Pre-diabetes [name] will continue to work on weight loss, exercise, and decreasing simple carbohydrates to help decrease the risk of diabetes. He will increase healthy fats and protein.   3. Class 2 severe obesity with serious comorbidity and body mass index (BMI) of 37.0 to 37.9 in adult, unspecified obesity type Jacob Cline) Jacob Cline is currently in the action stage of change. As such, his goal is to continue with weight loss efforts. He has agreed to the Category 3 Plan.   Jacob Cline will continue meal planning and he will continue intentional eating. Strategies for the holiday were  provided.  Exercise goals: No exercise has been prescribed at this time.  Behavioral modification strategies: increasing lean protein intake, decreasing simple carbohydrates, increasing vegetables, increasing water intake, decreasing eating out, no skipping meals, meal planning and cooking strategies, keeping healthy foods in the home, and planning for success.  Jacob Cline has agreed to follow-up with our clinic in 2 weeks. He was informed of the importance of frequent follow-up visits to maximize his success with intensive lifestyle modifications for his multiple health conditions.   Objective:   Blood pressure 101/65, pulse 66, temperature 97.8 F (36.6 C), height 5\' 8"  (1.727 m), weight 243 lb (110.2 kg), SpO2 94 %. Body mass index is 36.95 kg/m.  General: Cooperative, alert, well developed, in no acute distress. HEENT: Conjunctivae and lids unremarkable. Cardiovascular: Regular rhythm.  Lungs: Normal work of breathing. Neurologic: No focal deficits.   Lab Results  Component Value Date   CREATININE 0.93 02/04/2021   BUN 24 02/04/2021   NA 138 02/04/2021   K 4.7 02/04/2021   CL 97 02/04/2021   CO2 23 02/04/2021   Lab Results  Component Value Date   ALT 35 02/04/2021   AST 31 02/04/2021   ALKPHOS 93 02/04/2021   BILITOT 0.4 02/04/2021   Lab Results  Component Value Date   HGBA1C 6.0 (H) 02/04/2021   HGBA1C 5.9 02/10/2020   HGBA1C 6.0 06/10/2019   HGBA1C 5.7 12/18/2017   HGBA1C 5.7 10/25/2013   Lab Results  Component Value Date   INSULIN 22.0 02/04/2021   Lab  Results  Component Value Date   TSH 1.120 02/04/2021   Lab Results  Component Value Date   CHOL 157 02/04/2021   HDL 41 02/04/2021   LDLCALC 91 02/04/2021   TRIG 139 02/04/2021   CHOLHDL 2.8 10/19/2020   Lab Results  Component Value Date   VD25OH 37.8 02/04/2021   VD25OH 36.6 10/19/2020   VD25OH 31.6 02/11/2019   Lab Results  Component Value Date   WBC 7.5 10/19/2020   HGB 15.1 10/19/2020   HCT  44.8 10/19/2020   MCV 89 10/19/2020   PLT 191 10/19/2020   No results found for: IRON, TIBC, FERRITIN  Attestation Statements:   Reviewed by clinician on day of visit: allergies, medications, problem list, medical history, surgical history, family history, social history, and previous encounter notes.  I, Jackson Latino, RMA, am acting as Energy manager for [employer], DO.   I have reviewed the above documentation for accuracy and completeness, and I agree with the above. Corinna Capra, DO

## 2021-03-23 ENCOUNTER — Encounter (INDEPENDENT_AMBULATORY_CARE_PROVIDER_SITE_OTHER): Payer: Self-pay | Admitting: Bariatrics

## 2021-03-24 ENCOUNTER — Ambulatory Visit (INDEPENDENT_AMBULATORY_CARE_PROVIDER_SITE_OTHER): Payer: 59 | Admitting: Nurse Practitioner

## 2021-03-24 ENCOUNTER — Other Ambulatory Visit: Payer: Self-pay

## 2021-03-24 ENCOUNTER — Encounter: Payer: Self-pay | Admitting: Nurse Practitioner

## 2021-03-24 VITALS — BP 103/62 | HR 74 | Temp 96.5°F | Ht 68.0 in | Wt 245.0 lb

## 2021-03-24 DIAGNOSIS — L2389 Allergic contact dermatitis due to other agents: Secondary | ICD-10-CM

## 2021-03-24 MED ORDER — PREDNISONE 10 MG (21) PO TBPK: ORAL_TABLET | ORAL | 0 refills | Status: DC

## 2021-03-24 NOTE — Patient Instructions (Signed)
Contact Dermatitis Dermatitis is redness, soreness, and swelling (inflammation) of the skin. Contact dermatitis is a reaction to something that touches the skin. There are two types of contact dermatitis: Irritant contact dermatitis. This happens when something bothers (irritates) your skin, like soap. Allergic contact dermatitis. This is caused when you are exposed to something that you are allergic to, such as poison ivy. What are the causes? Common causes of irritant contact dermatitis include: Makeup. Soaps. Detergents. Bleaches. Acids. Metals, such as nickel. Common causes of allergic contact dermatitis include: Plants. Chemicals. Jewelry. Latex. Medicines. Preservatives in products, such as clothing. What increases the risk? Having a job that exposes you to things that bother your skin. Having asthma or eczema. What are the signs or symptoms? Symptoms may happen anywhere the irritant has touched your skin. Symptoms include: Dry or flaky skin. Redness. Cracks. Itching. Pain or a burning feeling. Blisters. Blood or clear fluid draining from skin cracks. With allergic contact dermatitis, swelling may occur. This may happen in places such as the eyelids, mouth, or genitals. How is this treated? This condition is treated by checking for the cause of the reaction and protecting your skin. Treatment may also include: Steroid creams, ointments, or medicines. Antibiotic medicines or other ointments, if you have a skin infection. Lotion or medicines to help with itching. A bandage (dressing). Follow these instructions at home: Skin care Moisturize your skin as needed. Put cool cloths on your skin. Put a baking soda paste on your skin. Stir water into baking soda until it looks like a paste. Do not scratch your skin. Avoid having things rub up against your skin. Avoid the use of soaps, perfumes, and dyes. Medicines Take or apply over-the-counter and prescription medicines  only as told by your doctor. If you were prescribed an antibiotic medicine, take or apply it as told by your doctor. Do not stop using it even if your condition starts to get better. Bathing Take a bath with: Epsom salts. Baking soda. Colloidal oatmeal. Bathe less often. Bathe in warm water. Avoid using hot water. Bandage care If you were given a bandage, change it as told by your health care provider. Wash your hands with soap and water before and after you change your bandage. If soap and water are not available, use hand sanitizer. General instructions Avoid the things that caused your reaction. If you do not know what caused it, keep a journal. Write down: What you eat. What skin products you use. What you drink. What you wear in the area that has symptoms. This includes jewelry. Check the affected areas every day for signs of infection. Check for: More redness, swelling, or pain. More fluid or blood. Warmth. Pus or a bad smell. Keep all follow-up visits as told by your doctor. This is important. Contact a doctor if: You do not get better with treatment. Your condition gets worse. You have signs of infection, such as: More swelling. Tenderness. More redness. Soreness. Warmth. You have a fever. You have new symptoms. Get help right away if: You have a very bad headache. You have neck pain. Your neck is stiff. You throw up (vomit). You feel very sleepy. You see red streaks coming from the area. Your bone or joint near the area hurts after the skin has healed. The area turns darker. You have trouble breathing. Summary Dermatitis is redness, soreness, and swelling of the skin. Symptoms may occur where the irritant has touched you. Treatment may include medicines and skin care. If you   do not know what caused your reaction, keep a journal. Contact a doctor if your condition gets worse or you have signs of infection. This information is not intended to replace advice  given to you by your health care provider. Make sure you discuss any questions you have with your health care provider. Document Revised: 08/15/2018 Document Reviewed: 11/08/2017 Elsevier Patient Education  2022 Elsevier Inc.   

## 2021-03-24 NOTE — Progress Notes (Signed)
Acute Office Visit  Subjective:    Patient ID: Jacob Cline, male    DOB: May 10, 1960, 60 y.o.   MRN: [id number]  No chief complaint on file.   Rash This is a new problem. The problem has been gradually worsening since onset. The affected locations include the left lower leg. The rash is characterized by dryness and redness. He was exposed to nothing. Pertinent negatives include no congestion, cough, fatigue, fever or shortness of breath. Past treatments include nothing. The treatment provided no relief.    Past Medical History:  Diagnosis Date   Aortic aneurysm (HCC)    Back pain    BPH (benign prostatic hypertrophy)    Cyst of left kidney    Edema of both lower extremities    GERD (gastroesophageal reflux disease)    Hemorrhoids    Hiatal hernia    Hyperlipidemia    Hypertension    Irregular heart beat    Joint pain    Obesity    Post-operative nausea and vomiting    Vitamin D deficiency     Past Surgical History:  Procedure Laterality Date   athroscopy of rt knee  11/94   henia - umbilical surgery  1/74/08   HERNIA REPAIR  05/15/2017   double hernia surgery   HIP SURGERY  11/2014   right hip   MOUTH SURGERY     VASECTOMY      Family History  Problem Relation Age of Onset   Diabetes Mother    Scleroderma Sister    Diabetes Brother    Diabetes Brother    Colon cancer Other        family history    Prostate cancer Other        family history     Social History   Socioeconomic History   Marital status: Married    Spouse name: Not on file   Number of children: Not on file   Years of education: Not on file   Highest education level: Not on file  Occupational History   Occupation: Full time   Occupation: Administrator  Tobacco Use   Smoking status: Never   Smokeless tobacco: Never  Vaping Use   Vaping Use: Never used  Substance and Sexual Activity   Alcohol use: No    Alcohol/week: 0.0 standard drinks   Drug use: No   Sexual activity: Not on  file  Other Topics Concern   Not on file  Social History Narrative   Not on file   Social Determinants of Health   Financial Resource Strain: Not on file  Food Insecurity: Not on file  Transportation Needs: Not on file  Physical Activity: Not on file  Stress: Not on file  Social Connections: Not on file  Intimate Partner Violence: Not on file    Outpatient Medications Prior to Visit  Medication Sig Dispense Refill   acetaminophen (TYLENOL) 500 MG tablet Take by mouth.     amoxicillin-clavulanate (AUGMENTIN) 875-125 MG tablet Take 1 tablet by mouth 2 (two) times daily. Take all of this medication 20 tablet 0   aspirin 81 MG chewable tablet Chew 81 mg by mouth daily.     cholecalciferol (VITAMIN D) 1000 UNITS tablet Take 1,000 Units by mouth daily. 3 tablets daily     Cinnamon 500 MG capsule Take 1,000 mg by mouth daily.      diclofenac (VOLTAREN) 75 MG EC tablet Take 1 tablet by mouth twice daily 60 tablet 0  gabapentin (NEURONTIN) 300 MG capsule Take 300 mg by mouth at bedtime.     magnesium oxide (MAG-OX) 400 MG tablet Take 400 mg by mouth daily.     Multiple Vitamin (MULTIVITAMIN) tablet Take 1 tablet by mouth daily.      Omega-3 Fatty Acids (FISH OIL) 1000 MG CAPS Take 1 capsule by mouth daily.      omeprazole (PRILOSEC) 20 MG capsule TAKE 1 CAPSULE BY MOUTH TWICE DAILY BEFORE MEAL(S) 180 capsule 3   PARoxetine (PAXIL-CR) 25 MG 24 hr tablet Take 1 tablet (25 mg total) by mouth daily. 90 tablet 3   pravastatin (PRAVACHOL) 40 MG tablet Take 1 tablet (40 mg total) by mouth at bedtime. 90 tablet 3   telmisartan (MICARDIS) 40 MG tablet Take 1 tablet (40 mg total) by mouth daily. 90 tablet 3   triamterene-hydrochlorothiazide (MAXZIDE-25) 37.5-25 MG tablet Take 1 tablet by mouth daily. 90 tablet 3   TURMERIC PO Take 1 capsule by mouth daily.      Vitamin D, Ergocalciferol, (DRISDOL) 1.25 MG (50000 UNIT) CAPS capsule Take 1 capsule (50,000 Units total) by mouth every 7 (seven) days. 4  capsule 0   No facility-administered medications prior to visit.    Allergies  Allergen Reactions   Other Anaphylaxis    Make me hurt alot   Codeine     Unknown    Lovastatin     Makes me hurt alot   Niaspan [Niacin Er]     Make me hurt alot    Review of Systems  Constitutional:  Negative for fatigue and fever.  HENT:  Negative for congestion.   Respiratory:  Negative for cough and shortness of breath.   Gastrointestinal: Negative.   Skin:  Positive for rash.  All other systems reviewed and are negative.     Objective:    Physical Exam Vitals and nursing note reviewed.  Constitutional:      Appearance: Normal appearance.  HENT:     Head: Normocephalic.     Right Ear: Ear canal and external ear normal.     Left Ear: Ear canal and external ear normal.     Nose: Nose normal.  Eyes:     Conjunctiva/sclera: Conjunctivae normal.  Cardiovascular:     Rate and Rhythm: Normal rate and regular rhythm.     Pulses: Normal pulses.     Heart sounds: Normal heart sounds.  Pulmonary:     Effort: Pulmonary effort is normal.     Breath sounds: Normal breath sounds.  Skin:    General: Skin is warm.     Findings: Erythema and rash present.  Neurological:     Mental Status: He is alert.  Psychiatric:        Behavior: Behavior normal.    BP 103/62   Pulse 74   Temp (!) 96.5 F (35.8 C) (Temporal)   Ht 5' 8"  (1.727 m)   Wt 245 lb (111.1 kg)   HC 20" (50.8 cm)   SpO2 95%   BMI 37.25 kg/m  Wt Readings from Last 3 Encounters:  03/24/21 245 lb (111.1 kg)  03/22/21 243 lb (110.2 kg)  03/01/21 245 lb (111.1 kg)    Health Maintenance Due  Topic Date Due   Pneumococcal Vaccine 16-100 Years old (1 - PCV) Never done   COVID-19 Vaccine (3 - Booster for Moderna series) 11/04/2019    There are no preventive care reminders to display for this patient.   Lab Results  Component Value Date  TSH 1.120 02/04/2021   Lab Results  Component Value Date   WBC 7.5 10/19/2020    HGB 15.1 10/19/2020   HCT 44.8 10/19/2020   MCV 89 10/19/2020   PLT 191 10/19/2020   Lab Results  Component Value Date   NA 138 02/04/2021   K 4.7 02/04/2021   CO2 23 02/04/2021   GLUCOSE 108 (H) 02/04/2021   BUN 24 02/04/2021   CREATININE 0.93 02/04/2021   BILITOT 0.4 02/04/2021   ALKPHOS 93 02/04/2021   AST 31 02/04/2021   ALT 35 02/04/2021   PROT 7.3 02/04/2021   ALBUMIN 4.5 02/04/2021   CALCIUM 9.3 02/04/2021   EGFR 93 02/04/2021   Lab Results  Component Value Date   CHOL 157 02/04/2021   Lab Results  Component Value Date   HDL 41 02/04/2021   Lab Results  Component Value Date   LDLCALC 91 02/04/2021   Lab Results  Component Value Date   TRIG 139 02/04/2021   Lab Results  Component Value Date   CHOLHDL 2.8 10/19/2020   Lab Results  Component Value Date   HGBA1C 6.0 (H) 02/04/2021       Assessment & Plan:   Problem List Items Addressed This Visit       Musculoskeletal and Integument   Allergic contact dermatitis due to other agents - Primary    On assessment patient's skin is red in 2 different spots. symptoms presents as contact dermatitis.  Educated patient to monitor skin for changes and redness, infection to rule out cellulitis.  I am treating patient with prednisone taper.  And advised to follow-up with worsening unresolved symptoms.      Relevant Medications   predniSONE (STERAPRED UNI-PAK 21 TAB) 10 MG (21) TBPK tablet     Meds ordered this encounter  Medications   predniSONE (STERAPRED UNI-PAK 21 TAB) 10 MG (21) TBPK tablet    Sig: 6 tablets day 1, 5 tablet day 2, 4 tablet day 3, 3 tablet day for, 2 tablet day 5, 1 tablet day 6    Dispense:  1 each    Refill:  0    Order Specific Question:   Supervising Provider    AnswerJeneen Rinks     Ivy Lynn, NP

## 2021-03-24 NOTE — Assessment & Plan Note (Signed)
On assessment patient's skin is red in 2 different spots. symptoms presents as contact dermatitis.  Educated patient to monitor skin for changes and redness, infection to rule out cellulitis.  I am treating patient with prednisone taper.  And advised to follow-up with worsening unresolved symptoms.

## 2021-04-05 ENCOUNTER — Other Ambulatory Visit: Payer: Self-pay | Admitting: Family Medicine

## 2021-04-05 DIAGNOSIS — M7742 Metatarsalgia, left foot: Secondary | ICD-10-CM

## 2021-04-09 ENCOUNTER — Other Ambulatory Visit: Payer: Self-pay | Admitting: Family Medicine

## 2021-04-09 DIAGNOSIS — I10 Essential (primary) hypertension: Secondary | ICD-10-CM

## 2021-04-13 ENCOUNTER — Encounter: Payer: Self-pay | Admitting: Internal Medicine

## 2021-04-23 ENCOUNTER — Telehealth: Payer: Self-pay | Admitting: Family Medicine

## 2021-04-23 NOTE — Telephone Encounter (Signed)
Pt aware.

## 2021-04-23 NOTE — Telephone Encounter (Signed)
Ok to use.  If not already taking acid reflux medication, recommend OTC prilosec while taking the ibuprofen to reduce risk of stomach bleed/ ulcer with the Paxil in combo with the Ibuprofen.

## 2021-04-26 ENCOUNTER — Other Ambulatory Visit: Payer: Self-pay

## 2021-04-26 ENCOUNTER — Encounter (INDEPENDENT_AMBULATORY_CARE_PROVIDER_SITE_OTHER): Payer: Self-pay | Admitting: Bariatrics

## 2021-04-26 ENCOUNTER — Ambulatory Visit (INDEPENDENT_AMBULATORY_CARE_PROVIDER_SITE_OTHER): Payer: 59 | Admitting: Bariatrics

## 2021-04-26 VITALS — BP 117/68 | HR 63 | Ht 68.0 in | Wt 246.0 lb

## 2021-04-26 DIAGNOSIS — I10 Essential (primary) hypertension: Secondary | ICD-10-CM | POA: Diagnosis not present

## 2021-04-26 DIAGNOSIS — E559 Vitamin D deficiency, unspecified: Secondary | ICD-10-CM | POA: Diagnosis not present

## 2021-04-26 DIAGNOSIS — Z6837 Body mass index (BMI) 37.0-37.9, adult: Secondary | ICD-10-CM

## 2021-04-26 MED ORDER — VITAMIN D (ERGOCALCIFEROL) 1.25 MG (50000 UNIT) PO CAPS: 50000.0000 [IU] | ORAL_CAPSULE | ORAL | 0 refills | Status: DC

## 2021-04-26 NOTE — Progress Notes (Signed)
Chief Complaint:   OBESITY Jacob Cline is here to discuss his progress with his obesity treatment plan along with follow-up of his obesity related diagnoses. Jacob Cline is on the Category 3 Plan and states he is following his eating plan approximately 80% of the time. Jacob Cline states he is doing 0 minutes 0 times per week.  Today's visit was #: 4 Starting weight: 247 lbs Starting date: 02/04/2021 Today's weight: 246 lbs Today's date: 04/26/2021 Total lbs lost to date: 1 lb Total lbs lost since last in-office visit: 0  Interim History: Jacob Cline is up 3 lbs since his last visit. 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   Subjective:   1. Vitamin D deficiency Jacob Cline is taking Vitamin D currently.  2. Essential hypertension Jacob Cline blood pressure is controlled.   Assessment/Plan:   1. Vitamin D deficiency Low Vitamin D level contributes to fatigue and are associated with obesity, breast, and colon cancer. We will refill prescription Vitamin D 50,000 IU every week for 1 month with no refills and Jacob Cline will follow-up for routine testing of Vitamin D, at least 2-3 times per year to avoid over-replacement.  - Vitamin D, Ergocalciferol, (DRISDOL) 1.25 MG (50000 UNIT) CAPS capsule; Take 1 capsule (50,000 Units total) by mouth every 7 (seven) days.  Dispense: 4 capsule; Refill: 0  2. Essential hypertension Jacob Cline will continue his medications. He is working on healthy weight loss and exercise to improve blood pressure control. We will watch for signs of hypotension as he continues his lifestyle modifications.  3. Obesity, current BMI 37.5 Jacob Cline is currently in the action stage of change. As such, his goal is to continue with weight loss efforts. He has agreed to the Category 3 Plan.   Jacob Cline will continue meal planning. He will adhere to the plan. He will keep his carbohydrates low.   Exercise goals:  Jacob Cline will start walking.  Behavioral modification strategies: increasing lean protein intake,  decreasing simple carbohydrates, increasing vegetables, increasing water intake, decreasing eating out, no skipping meals, meal planning and cooking strategies, keeping healthy foods in the home, and planning for success.  Jacob Cline has agreed to follow-up with our clinic in 2 weeks. He was informed of the importance of frequent follow-up visits to maximize his success with intensive lifestyle modifications for his multiple health conditions.   Objective:   Blood pressure 117/68, pulse 63, height 5\' 8"  (1.727 m), weight 246 lb (111.6 kg), SpO2 96 %. Body mass index is 37.4 kg/m.  General: Cooperative, alert, well developed, in no acute distress. HEENT: Conjunctivae and lids unremarkable. Cardiovascular: Regular rhythm.  Lungs: Normal work of breathing. Neurologic: No focal deficits.   Lab Results  Component Value Date   CREATININE 0.93 02/04/2021   BUN 24 02/04/2021   NA 138 02/04/2021   K 4.7 02/04/2021   CL 97 02/04/2021   CO2 23 02/04/2021   Lab Results  Component Value Date   ALT 35 02/04/2021   AST 31 02/04/2021   ALKPHOS 93 02/04/2021   BILITOT 0.4 02/04/2021   Lab Results  Component Value Date   HGBA1C 6.0 (H) 02/04/2021   HGBA1C 5.9 02/10/2020   HGBA1C 6.0 06/10/2019   HGBA1C 5.7 12/18/2017   HGBA1C 5.7 10/25/2013   Lab Results  Component Value Date   INSULIN 22.0 02/04/2021   Lab Results  Component Value Date   TSH 1.120 02/04/2021   Lab Results  Component Value Date   CHOL 157 02/04/2021   HDL 41  02/04/2021   LDLCALC 91 02/04/2021   TRIG 139 02/04/2021   CHOLHDL 2.8 10/19/2020   Lab Results  Component Value Date   VD25OH 37.8 02/04/2021   VD25OH 36.6 10/19/2020   VD25OH 31.6 02/11/2019   Lab Results  Component Value Date   WBC 7.5 10/19/2020   HGB 15.1 10/19/2020   HCT 44.8 10/19/2020   MCV 89 10/19/2020   PLT 191 10/19/2020   No results found for: IRON, TIBC, FERRITIN  Attestation Statements:   Reviewed by clinician on day of visit:  allergies, medications, problem list, medical history, surgical history, family history, social history, and previous encounter notes.  I, Jackson Latino, RMA, am acting as Energy manager for [employer], DO.  I have reviewed the above documentation for accuracy and completeness, and I agree with the above. Corinna Capra, DO

## 2021-04-27 ENCOUNTER — Encounter (INDEPENDENT_AMBULATORY_CARE_PROVIDER_SITE_OTHER): Payer: Self-pay | Admitting: Bariatrics

## 2021-05-09 HISTORY — PX: WRIST FRACTURE SURGERY: SHX121

## 2021-05-24 ENCOUNTER — Ambulatory Visit (INDEPENDENT_AMBULATORY_CARE_PROVIDER_SITE_OTHER): Payer: 59 | Admitting: Family Medicine

## 2021-05-24 ENCOUNTER — Encounter: Payer: Self-pay | Admitting: Family Medicine

## 2021-05-24 VITALS — BP 116/73 | HR 66 | Temp 98.3°F | Ht 68.0 in | Wt 249.5 lb

## 2021-05-24 DIAGNOSIS — L52 Erythema nodosum: Secondary | ICD-10-CM | POA: Diagnosis not present

## 2021-05-24 MED ORDER — PREDNISONE 10 MG PO TABS: ORAL_TABLET | ORAL | 0 refills | Status: DC

## 2021-05-24 NOTE — Patient Instructions (Addendum)
If you decide you want medication for weight loss, check with insurance to see if they will cover wegovy or saxenda.  These are injections.  If you are still having symptoms of the erythema nodosum after the prednisone, see the dermatologist to talk about Plaquenil as a treatment.  Erythema Nodosum Erythema nodosum is a skin condition in which patches of fat under the skin of the lower legs become inflamed. This causes painful bumps (nodules) to form. What are the causes? This condition may be caused by: Infections. Strep throat (pharyngitis) is a common cause. Certain medicines, especially birth control pills, penicillin, and sulfa medicines. Sarcoidosis. Pregnancy. Certain inflammatory conditions, such as Crohn's disease. Certain cancers. In some cases, the cause may not be known. What increases the risk? This condition is more likely to develop in young adult women. This may be related to birth control pills. What are the signs or symptoms? The main symptom of this condition is the development of nodules that: Look like raised bruises and are tender to the touch. Usually appear on the front of the lower legs (shins) and may also appear on the arms or the abdomen. Gradually change in color from pink to brown. Leave a dark mark that fades away after several months. Other symptoms besides nodules may include: Fever. Tiredness(fatigue). Joint pain. How is this diagnosed? This condition may be diagnosed based on: Your symptoms and your medical history. A physical exam. Tests, such as: Blood tests. X-rays. A skin sample may be removed (skin biopsy) to be examined by a specialist (pathologist). How is this treated? Treatment for this condition depends on the cause. The nodules usually go away after the underlying cause is treated, such as after medicine is used to fight infection. Treatment may also include: NSAIDs, such as ibuprofen, for pain and inflammation. Potassium iodide  supplements. Steroid medicines. Resting and raising (elevating) the affected legs. Follow these instructions at home: Medicines Take over-the-counter and prescription medicines only as told by your health care provider. If you were prescribed an antibiotic medicine, take or apply it as told by your health care provider. Do not stop using the antibiotic even if you start to feel better. Managing pain, stiffness, and swelling  If directed, put ice on the affected area. To do this: Put ice in a plastic bag. Place a towel between your skin and the bag. Leave the ice on for 20 minutes, 2-3 times a day. Remove the ice if your skin turns bright red. This is very important. If you cannot feel pain, heat, or cold, you have a greater risk of damage to the area. Elevate the affected area above the level of your heart while you are sitting or lying down. Wear a compression bandage as told by your health care provider. Activity Rest and return to your normal activities as told by your health care provider. Ask your health care provider what activities are safe for you. Avoid very intense (vigorous) exercise until your symptoms go away. General instructions Avoid scratching your skin. To relieve itchiness, make a paste with dry oatmeal and warm water, then put the paste on itchy areas. Let the paste dry, remove it, and then apply moisturizer. You may do this 2-3 times a day, or as needed. Keep all follow-up visits. This is important. Contact a health care provider if you: Have symptoms that do not get better with treatment and home care. Have a fever that does not go away. Vomit more than one time. Have pain that  gets worse. Have a sore throat. Summary Erythema nodosum is a skin condition that causes painful bumps (nodules) to form. The bumps usually appear on the front of the lower legs (shins). Avoid very intense (vigorous) exercise until your symptoms go away. Contact a health care provider if  you have a fever or if your symptoms do not improve. This information is not intended to replace advice given to you by your health care provider. Make sure you discuss any questions you have with your health care provider. Document Revised: 07/08/2020 Document Reviewed: 07/08/2020 Elsevier Patient Education  [place].

## 2021-05-24 NOTE — Progress Notes (Signed)
Subjective: CC: Follow-up erythema nodosum PCP: [name], DO [id number] Jacob Cline is a 61 y.o. male presenting to clinic today for:  1.  Erythema nodosum This was diagnosed on biopsy by dermatology.  He is status post treatment with 3 weeks of Motrin, which she apparently was taking between cases of Voltaren.  He has also had a 6-day prednisone Dosepak prior to starting the medication by dermatology.  He notes that there is 1 lesion on the lower left leg that is still erythematous and slightly tender but overall symptoms seem to be better than they had been.   ROS: Per HPI  Allergies  Allergen Reactions   Other Anaphylaxis    Make me hurt alot   Codeine     Unknown    Lovastatin     Makes me hurt alot   Niaspan [Niacin Er]     Make me hurt alot   Past Medical History:  Diagnosis Date   Aortic aneurysm (HCC)    Back pain    BPH (benign prostatic hypertrophy)    Cyst of left kidney    Edema of both lower extremities    GERD (gastroesophageal reflux disease)    Hemorrhoids    Hiatal hernia    Hyperlipidemia    Hypertension    Irregular heart beat    Joint pain    Obesity    Post-operative nausea and vomiting    Vitamin D deficiency     Current Outpatient Medications:    acetaminophen (TYLENOL) 500 MG tablet, Take by mouth., Disp: , Rfl:    amoxicillin-clavulanate (AUGMENTIN) 875-125 MG tablet, Take 1 tablet by mouth 2 (two) times daily. Take all of this medication, Disp: 20 tablet, Rfl: 0   aspirin 81 MG chewable tablet, Chew 81 mg by mouth daily., Disp: , Rfl:    cholecalciferol (VITAMIN D) 1000 UNITS tablet, Take 1,000 Units by mouth daily. 3 tablets daily, Disp: , Rfl:    Cinnamon 500 MG capsule, Take 1,000 mg by mouth daily. , Disp: , Rfl:    diclofenac (VOLTAREN) 75 MG EC tablet, Take 1 tablet (75 mg total) by mouth 2 (two) times daily as needed., Disp: 60 tablet, Rfl: 2   gabapentin (NEURONTIN) 300 MG capsule, Take 300 mg by mouth at bedtime., Disp: ,  Rfl:    magnesium oxide (MAG-OX) 400 MG tablet, Take 400 mg by mouth daily., Disp: , Rfl:    Multiple Vitamin (MULTIVITAMIN) tablet, Take 1 tablet by mouth daily. , Disp: , Rfl:    Omega-3 Fatty Acids (FISH OIL) 1000 MG CAPS, Take 1 capsule by mouth daily. , Disp: , Rfl:    omeprazole (PRILOSEC) 20 MG capsule, TAKE 1 CAPSULE BY MOUTH TWICE DAILY BEFORE MEAL(S), Disp: 180 capsule, Rfl: 3   PARoxetine (PAXIL-CR) 25 MG 24 hr tablet, Take 1 tablet (25 mg total) by mouth daily., Disp: 90 tablet, Rfl: 3   pravastatin (PRAVACHOL) 40 MG tablet, Take 1 tablet (40 mg total) by mouth at bedtime., Disp: 90 tablet, Rfl: 3   predniSONE (STERAPRED UNI-PAK 21 TAB) 10 MG (21) TBPK tablet, 6 tablets day 1, 5 tablet day 2, 4 tablet day 3, 3 tablet day for, 2 tablet day 5, 1 tablet day 6, Disp: 1 each, Rfl: 0   telmisartan (MICARDIS) 40 MG tablet, Take 1 tablet by mouth once daily, Disp: 30 tablet, Rfl: 1   triamterene-hydrochlorothiazide (MAXZIDE-25) 37.5-25 MG tablet, Take 1 tablet by mouth daily., Disp: 90 tablet, Rfl: 3  TURMERIC PO, Take 1 capsule by mouth daily. , Disp: , Rfl:    Vitamin D, Ergocalciferol, (DRISDOL) 1.25 MG (50000 UNIT) CAPS capsule, Take 1 capsule (50,000 Units total) by mouth every 7 (seven) days., Disp: 4 capsule, Rfl: 0 Social History   Socioeconomic History   Marital status: Married    Spouse name: Not on file   Number of children: Not on file   Years of education: Not on file   Highest education level: Not on file  Occupational History   Occupation: Full time   Occupation: Naval architect  Tobacco Use   Smoking status: Never   Smokeless tobacco: Never  Vaping Use   Vaping Use: Never used  Substance and Sexual Activity   Alcohol use: No    Alcohol/week: 0.0 standard drinks   Drug use: No   Sexual activity: Not on file  Other Topics Concern   Not on file  Social History Narrative   Not on file   Social Determinants of Health   Financial Resource Strain: Not on file  Food  Insecurity: Not on file  Transportation Needs: Not on file  Physical Activity: Not on file  Stress: Not on file  Social Connections: Not on file  Intimate Partner Violence: Not on file   Family History  Problem Relation Age of Onset   Diabetes Mother    Scleroderma Sister    Diabetes Brother    Diabetes Brother    Colon cancer Other        family history    Prostate cancer Other        family history     Objective: Office vital signs reviewed. BP 116/73    Pulse 66    Temp 98.3 F (36.8 C)    Ht 5\' 8"  (1.727 m)    Wt 249 lb 8 oz (113.2 kg)    BMI 37.94 kg/m   Physical Examination:  General: Awake, alert, morbidly obese, No acute distress Skin: Erythematous, nodular lesion noted along the distal left anterior leg with central skin breakdown.  He has a healing biopsy site on the left upper leg  Assessment/ Plan: 61 y.o. male   Erythema nodosum - Plan: predniSONE (DELTASONE) 10 MG tablet  Morbid obesity (HCC)  Since his symptoms are refractory to NSAIDs, I am going to place him on a longer taper of prednisone to see if this may resolve his symptoms.  If not, I highly recommended that he follow-up with dermatology for consideration of immunomodulators like Plaquenil.  For his morbid obesity he is actively working on weight loss but will not be going back to healthy weight and wellness due to cost.  I offered initiation of pharmacologic's and he will look into it with his insurance should he decide to proceed with that  No orders of the defined types were placed in this encounter.  No orders of the defined types were placed in this encounter.    66, DO [facility] [phone]

## 2021-05-26 ENCOUNTER — Other Ambulatory Visit: Payer: Self-pay | Admitting: Family Medicine

## 2021-05-26 DIAGNOSIS — N2889 Other specified disorders of kidney and ureter: Secondary | ICD-10-CM

## 2021-07-01 ENCOUNTER — Other Ambulatory Visit: Payer: Self-pay | Admitting: Family Medicine

## 2021-07-01 DIAGNOSIS — M7741 Metatarsalgia, right foot: Secondary | ICD-10-CM

## 2021-07-22 ENCOUNTER — Encounter: Payer: Self-pay | Admitting: Family Medicine

## 2021-07-27 ENCOUNTER — Ambulatory Visit (HOSPITAL_COMMUNITY): Payer: 59

## 2021-07-28 ENCOUNTER — Telehealth: Payer: Self-pay | Admitting: Family Medicine

## 2021-07-28 NOTE — Telephone Encounter (Signed)
Pt called stating that he is scheduled to have an MRI done on 08/09/21 and wants to know if while he is there, if order can be placed to have his left side/rib area looked at as well. ? ?Says he is having on/off sharp pains in that area and says the last time he felt that pain, he had an xray done, which didn't show anything, but says its back again and wants something more than an xray done to see if anything can be found.  ? ? ?

## 2021-07-28 NOTE — Telephone Encounter (Signed)
PT AWARE  

## 2021-07-28 NOTE — Telephone Encounter (Signed)
Pt says he works 3rd shift so the best time to call him back about this would be around 3:00 if possible ?

## 2021-07-28 NOTE — Telephone Encounter (Signed)
The MRI abd/pelvis will get the bottoms of the lung fields, which would incorporate the lower ribs.  However, since his MRI will be with contrast, we likely cannot arrange a true MRI to look at bony structures at the same time.  There is no MRI of ribs specifically to my knowledge.  Usually, it would be imaging dedicated to the thoracic spine.  Recommend follow up with his orthopedist if he is having rib pain/ musculoskeletal issues to determine appropriateness of MRI spine ?

## 2021-08-04 ENCOUNTER — Ambulatory Visit (INDEPENDENT_AMBULATORY_CARE_PROVIDER_SITE_OTHER): Payer: 59

## 2021-08-04 ENCOUNTER — Ambulatory Visit (INDEPENDENT_AMBULATORY_CARE_PROVIDER_SITE_OTHER): Payer: 59 | Admitting: Family Medicine

## 2021-08-04 ENCOUNTER — Encounter: Payer: Self-pay | Admitting: Family Medicine

## 2021-08-04 VITALS — BP 117/76 | HR 78 | Temp 98.0°F | Ht 68.0 in | Wt 252.2 lb

## 2021-08-04 DIAGNOSIS — L52 Erythema nodosum: Secondary | ICD-10-CM | POA: Diagnosis not present

## 2021-08-04 DIAGNOSIS — R0609 Other forms of dyspnea: Secondary | ICD-10-CM | POA: Diagnosis not present

## 2021-08-04 DIAGNOSIS — R0781 Pleurodynia: Secondary | ICD-10-CM

## 2021-08-04 NOTE — Progress Notes (Signed)
? ?Subjective: ?CC: Rib pain ?PCP: Jacob Norlander, DO ?[name] is a 61 y.o. male presenting to clinic today for: ? ?1.  Rib pain ?Patient reports that he has been having bilateral rib pain for several weeks now but the left has been worse than right.  Sometimes if he bends a certain way or lies on that side it seems to be exacerbated.  He mentioned this to his orthopedist but was told that they "do not address ribs".  He has subsequently presented today in efforts to figure out why he is having the symptoms.  Sneezing worsens the rib pain.  He points to the rib angle of the left ninth and 10th ribs as the primary area of discomfort.  Has been utilizing Tylenol 3 to 4/day and continues on the Voltaren.  Would like renal function and liver enzymes checked today. ? ? ?2.  Dyspnea on exertion ?Patient reports over the last several months he has been experiencing worsening dyspnea with exertion.  Sometimes he has wheezing.  He was evaluated by pulmonology many years ago was told he might have asthma but he did not really respond to the albuterol so he did not believe that was a true diagnosis.  He is willing to undergo reevaluation since symptoms seem to be more prevalent now.  No unplanned weight loss.  No hemoptysis.  No reports of edema or orthopnea.   ? ? ?ROS: Per HPI ? ?Allergies  ?Allergen Reactions  ? Other Anaphylaxis  ?  Make me hurt alot  ? Codeine   ?  Unknown   ? Lovastatin   ?  Makes me hurt alot  ? Niaspan [Niacin Er]   ?  Make me hurt alot  ? ?Past Medical History:  ?Diagnosis Date  ? Aortic aneurysm ([place])   ? Back pain   ? BPH (benign prostatic hypertrophy)   ? Cyst of left kidney   ? Edema of both lower extremities   ? GERD (gastroesophageal reflux disease)   ? Hemorrhoids   ? Hiatal hernia   ? Hyperlipidemia   ? Hypertension   ? Irregular heart beat   ? Joint pain   ? Obesity   ? Post-operative nausea and vomiting   ? Vitamin D deficiency   ? ? ?Current Outpatient Medications:  ?   acetaminophen (TYLENOL) 500 MG tablet, Take by mouth., Disp: , Rfl:  ?  aspirin 81 MG chewable tablet, Chew 81 mg by mouth daily., Disp: , Rfl:  ?  cholecalciferol (VITAMIN D) 1000 UNITS tablet, Take 1,000 Units by mouth daily. 3 tablets daily, Disp: , Rfl:  ?  Cinnamon 500 MG capsule, Take 1,000 mg by mouth daily. , Disp: , Rfl:  ?  diclofenac (VOLTAREN) 75 MG EC tablet, Take 1 tablet by mouth twice daily as needed, Disp: 60 tablet, Rfl: 5 ?  gabapentin (NEURONTIN) 300 MG capsule, Take 300 mg by mouth at bedtime., Disp: , Rfl:  ?  magnesium oxide (MAG-OX) 400 MG tablet, Take 400 mg by mouth daily., Disp: , Rfl:  ?  Multiple Vitamin (MULTIVITAMIN) tablet, Take 1 tablet by mouth daily. , Disp: , Rfl:  ?  Omega-3 Fatty Acids (FISH OIL) 1000 MG CAPS, Take 1 capsule by mouth daily. , Disp: , Rfl:  ?  omeprazole (PRILOSEC) 20 MG capsule, TAKE 1 CAPSULE BY MOUTH TWICE DAILY BEFORE MEAL(S), Disp: 180 capsule, Rfl: 3 ?  PARoxetine (PAXIL-CR) 25 MG 24 hr tablet, Take 1 tablet (25 mg total) by  mouth daily., Disp: 90 tablet, Rfl: 3 ?  pravastatin (PRAVACHOL) 40 MG tablet, Take 1 tablet (40 mg total) by mouth at bedtime., Disp: 90 tablet, Rfl: 3 ?  predniSONE (DELTASONE) 10 MG tablet, Take 44m by mouth day 1-2, 517mday 3-4, 4019may 5-6, 69m23my 7-8, 20mg43m 9-10, 10mg 23m11-12.  Then stop., Disp: 42 tablet, Rfl: 0 ?  telmisartan (MICARDIS) 40 MG tablet, Take 1 tablet by mouth once daily, Disp: 30 tablet, Rfl: 1 ?  triamterene-hydrochlorothiazide (MAXZIDE-25) 37.5-25 MG tablet, Take 1 tablet by mouth daily., Disp: 90 tablet, Rfl: 3 ?  TURMERIC PO, Take 1 capsule by mouth daily. , Disp: , Rfl:  ?  Vitamin D, Ergocalciferol, (DRISDOL) 1.25 MG (50000 UNIT) CAPS capsule, Take 1 capsule (50,000 Units total) by mouth every 7 (seven) days., Disp: 4 capsule, Rfl: 0 ?Social History  ? ?Socioeconomic History  ? Marital status: Married  ?  Spouse name: Not on file  ? Number of children: Not on file  ? Years of education: Not on file   ? Highest education level: Not on file  ?Occupational History  ? Occupation: Full time  ? Occupation: Truck Administratoracco Use  ? Smoking status: Never  ? Smokeless tobacco: Never  ?Vaping Use  ? Vaping Use: Never used  ?Substance and Sexual Activity  ? Alcohol use: No  ?  Alcohol/week: 0.0 standard drinks  ? Drug use: No  ? Sexual activity: Not on file  ?Other Topics Concern  ? Not on file  ?Social History Narrative  ? Not on file  ? ?Social Determinants of Health  ? ?Financial Resource Strain: Not on file  ?Food Insecurity: Not on file  ?Transportation Needs: Not on file  ?Physical Activity: Not on file  ?Stress: Not on file  ?Social Connections: Not on file  ?Intimate Partner Violence: Not on file  ? ?Family History  ?Problem Relation Age of Onset  ? Diabetes Mother   ? Scleroderma Sister   ? Diabetes Brother   ? Diabetes Brother   ? Colon cancer Other   ?     family history   ? Prostate cancer Other   ?     family history   ? ? ?Objective: ?Office vital signs reviewed. ?BP 117/76   Pulse 78   Temp 98 ?F (36.7 ?C)   Ht _0  (1.727 m)   Wt 252 lb 3.2 oz (114.4 kg)   SpO2 96%   BMI 38.35 kg/m?  ? ?Physical Examination:  ?General: Awake, alert, morbidly obese, No acute distress ?Pulm: Normal work of breathing on room air.  No wheezes ?MSK: Tenderness palpation along the rib angle on the left at approximately rib 9 and 10.  There were no palpable bony abnormalities ? ?No results found. ? ? ?Assessment/ Plan: ?61 y.o64male  ? ?Rib pain - Plan: DG Ribs Bilateral, CMP14+EGFR ? ?Erythema nodosum - Plan: CMP14+EGFR ? ?Morbid obesity (HCC) ?[place] - [place]: Ambulatory referral to Pulmonology ? ?Likely costochondritis but given persistent nature will evaluate with x-ray as I want to look at his lungs anyways. ? ?For the erythema nodosum, he is no longer experiencing pain but still has some lesions which have not totally healed.  I have given him Xeroform bandages to apply to the affected areas ? ?We  discussed at length today that morbid obesity has a large impact on his overall musculoskeletal function and likely exacerbates pain and dyspnea on exertion.  I do  think that there is a level of physical deconditioning as patient has a primarily seated job and has very little physical exercise.  We discussed reduction of caloric intake given very low physical activity.  We discussed ways to replace unhealthy snacking with healthy snacking on the road.  Referral to pulmonology placed for pulmonary function testing.  Discussed the possibility of need of daily inhaler but certainly think that increase physical activity would improve his ability to tolerate exertion ? ?No orders of the defined types were placed in this encounter. ? ?No orders of the defined types were placed in this encounter. ? ? ? ?Jacob Norlander, DO ?[place] ?[phone] ? ?

## 2021-08-05 LAB — CMP14+EGFR
ALT: 40 IU/L (ref 0–44)
AST: 27 IU/L (ref 0–40)
Albumin/Globulin Ratio: 2 (ref 1.2–2.2)
Albumin: 4.9 g/dL — ABNORMAL HIGH (ref 3.8–4.8)
Alkaline Phosphatase: 105 IU/L (ref 44–121)
BUN/Creatinine Ratio: 28 — ABNORMAL HIGH (ref 10–24)
BUN: 28 mg/dL — ABNORMAL HIGH (ref 8–27)
Bilirubin Total: 0.5 mg/dL (ref 0.0–1.2)
CO2: 26 mmol/L (ref 20–29)
Calcium: 9.7 mg/dL (ref 8.6–10.2)
Chloride: 98 mmol/L (ref 96–106)
Creatinine, Ser: 1 mg/dL (ref 0.76–1.27)
Globulin, Total: 2.4 g/dL (ref 1.5–4.5)
Glucose: 90 mg/dL (ref 70–99)
Potassium: 4.4 mmol/L (ref 3.5–5.2)
Sodium: 139 mmol/L (ref 134–144)
Total Protein: 7.3 g/dL (ref 6.0–8.5)
eGFR: 85 mL/min/{1.73_m2} (ref >59)

## 2021-08-09 ENCOUNTER — Ambulatory Visit (HOSPITAL_COMMUNITY)
Admission: RE | Admit: 2021-08-09 | Discharge: 2021-08-09 | Disposition: A | Discharge status: Home / Self Care | Payer: 59 | Source: Ambulatory Visit | Attending: Family Medicine | Admitting: Family Medicine

## 2021-08-09 DIAGNOSIS — N2889 Other specified disorders of kidney and ureter: Secondary | ICD-10-CM | POA: Diagnosis present

## 2021-08-09 MED ORDER — GADOBUTROL 1 MMOL/ML IV SOLN
10.0000 mL | Freq: Once | INTRAVENOUS | Status: AC | PRN
  Administered 2021-08-09: 10 mL via INTRAVENOUS

## 2021-09-06 ENCOUNTER — Encounter: Payer: Self-pay | Admitting: Internal Medicine

## 2021-09-06 ENCOUNTER — Ambulatory Visit (INDEPENDENT_AMBULATORY_CARE_PROVIDER_SITE_OTHER): Payer: 59 | Admitting: Internal Medicine

## 2021-09-06 VITALS — BP 128/74 | HR 82 | Temp 98.1°F | Ht 70.0 in | Wt 256.2 lb

## 2021-09-06 DIAGNOSIS — R0602 Shortness of breath: Secondary | ICD-10-CM

## 2021-09-06 DIAGNOSIS — G4733 Obstructive sleep apnea (adult) (pediatric): Secondary | ICD-10-CM | POA: Diagnosis not present

## 2021-09-06 DIAGNOSIS — Z9989 Dependence on other enabling machines and devices: Secondary | ICD-10-CM | POA: Diagnosis not present

## 2021-09-06 MED ORDER — ALBUTEROL SULFATE HFA 108 (90 BASE) MCG/ACT IN AERS
2.0000 | INHALATION_SPRAY | Freq: Four times a day (QID) | RESPIRATORY_TRACT | 5 refills | Status: DC | PRN
Start: 2021-09-06 — End: 2023-10-16

## 2021-09-06 NOTE — Patient Instructions (Signed)
Please schedule follow up scheduled with myself in 1 months.  If my schedule is not open yet, we will contact you with a reminder closer to that time. Please call [phone] if you haven't heard from [place] a month before.  ? ?Before your next visit I would like you to have: ? ?Full set of PFTs - 40 minutes ? ?Take the albuterol rescue inhaler every 4 to 6 hours as needed for wheezing or shortness of breath. ?You can also take it 15 minutes before exercise or exertional activity. ?Side effects include heart racing or pounding, jitters or anxiety. If you have a history of an irregular heart rhythm, it can make this worse. Can also give some patients a hard time sleeping. ? ?To inhale the aerosol using an inhaler, follow these steps: ? ?Remove the protective dust cap from the end of the mouthpiece. If the dust cap was not placed on the mouthpiece, check the mouthpiece for dirt or other objects. Be sure that the canister is fully and firmly inserted in the mouthpiece. ?2. If you are using the inhaler for the first time or if you have not used the inhaler in more than 14 days, you will need to prime it. You may also need to prime the inhaler if it has been dropped. Ask your pharmacist or check the manufacturer's information if this happens. To prime the inhaler, shake it well and then press down on the canister 4 times to release 4 sprays into the air, away from your face. Be careful not to get albuterol in your eyes. ?3. Shake the inhaler well. ?4. Breathe out as completely as possible through your mouth. ?4. Hold the canister with the mouthpiece on the bottom, facing you and the canister pointing upward. Place the open end of the mouthpiece into your mouth. Close your lips tightly around the mouthpiece. ?6. Breathe in slowly and deeply through the mouthpiece.At the same time, press down once on the container to spray the medication into your mouth. ?7. Try to hold your breath for 10 seconds. remove the inhaler, and  breathe out slowly. ?8. If you were told to use 2 puffs, wait 1 minute and then repeat steps 3-7. ?9. Replace the protective cap on the inhaler. ?10. Clean your inhaler regularly. Follow the manufacturer's directions carefully and ask your doctor or pharmacist if you have any questions about cleaning your inhaler. ? ?Check the back of the inhaler to keep track of the total number of doses left on the inhaler.   ? ? ?

## 2021-09-06 NOTE — Progress Notes (Signed)
The patient has been prescribed the inhaler albuterol. Inhaler technique was demonstrated to patient. The patient subsequently demonstrated correct technique.  

## 2021-09-06 NOTE — Progress Notes (Signed)
? ?      ?Jacob Cline    [id number]    12/25/1960 ? ?Primary Care Physician:Jacob Cline ? ?Referring Physician: [name], Cline ?[address] [place],  [place] [address] ?Reason for Consultation: shortness of breath ?Date of Consultation: 09/06/2021 ? ?Chief complaint:   ?Chief Complaint  ?Patient presents with  ? Consult  ?  Chronic SOB/ wheezing   ?  ? ?HPI: ?Jacob Cline is a 61 y.o. man here for new patient evaluation of shortness of breath for the past 1-2 years. He was hospitalized with covid infection in Nov 2020. Was discharged on room air. It felt like it took him a long time to get better. He does have OSA and wears a CPAP with 2L bleed in. Follows with Dr. Vickey Cline neurology. He wears nightly but still feels tired. Might due to working third shift.  ? ?Gets short of breath with exertion doing work around the yard.  ?He describes himself as previously very active and now needs to take more breaks to get something done.  ?He does have a wheeze but denies coughing.  ?He had a DOT physical and had to blow into a breathalyzer and had difficulty exhaling on this.  ? ?He had childhood bronchitis but no respiratory symptoms as an adult.  ? ?Has been prescribed albuterol once before but doesn't know if it helped, this was several years ago  ? ?Feels that things got noticeably worse after covid in 2020.  ? ?Social history: ? 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  ?Exposures: lives at home with wife and dog.  ?Smoking history: never smoker. He does smoke cigars less than once/month. Passive smoke exposure with wife and in childhood ? ?Social History  ? ?Occupational History  ? Occupation: Full time  ? Occupation: Naval architect  ?Tobacco Use  ? Smoking status: Never  ? Smokeless tobacco: Never  ?Vaping Use  ? Vaping Use: Never used  ?Substance and Sexual Activity  ? Alcohol use: No  ?  Alcohol/week: 0.0 standard drinks  ? Drug use: No  ? Sexual activity: Not on file   ? ? ?Relevant family history: ? ?Family History  ?Problem Relation Age of Onset  ? Diabetes Mother   ? Scleroderma Sister   ? Diabetes Brother   ? Diabetes Brother   ? Colon cancer Other   ?     family history   ? Prostate cancer Other   ?     family history   ? Lung disease Neg Hx   ? ? ?Past Medical History:  ?Diagnosis Date  ? Aortic aneurysm (HCC)   ? Back pain   ? BPH (benign prostatic hypertrophy)   ? Cyst of left kidney   ? Edema of both lower extremities   ? GERD (gastroesophageal reflux disease)   ? Hemorrhoids   ? Hiatal hernia   ? Hyperlipidemia   ? Hypertension   ? Irregular heart beat   ? Joint pain   ? Obesity   ? Post-operative nausea and vomiting   ? Vitamin D deficiency   ? ? ?Past Surgical History:  ?Procedure Laterality Date  ? athroscopy of rt knee  02/02  ? henia - umbilical surgery  07/17/07  ? HERNIA REPAIR  05/15/2017  ? double hernia surgery  ? HIP SURGERY  11/2014  ? right hip  ? MOUTH SURGERY    ? VASECTOMY    ? ? ? ?  Physical Exam: ?Blood pressure 128/74, pulse 82, temperature 98.1 ?F (36.7 ?C), temperature source Oral, height 5\' 10"  (1.778 m), weight 256 lb 3.2 oz (116.2 kg), SpO2 97 %. ?Gen:      No acute distress ?ENT:  no nasal polyps, mucus membranes moist ?Lungs:    No increased respiratory effort, symmetric chest wall excursion, clear to auscultation bilaterally, no wheezes or crackles ?CV:         Regular rate and rhythm; no murmurs, rubs, or gallops.  No pedal edema ?Abd:      + bowel sounds; soft, non-tender; truncal obesity ?MSK: no acute synovitis of DIP or PIP joints, no mechanics hands.  ?Skin:      Warm and dry; no rashes ?Neuro: normal speech, no focal facial asymmetry ?Psych: alert and oriented x3, normal mood and affect ? ? ?Data Reviewed/Medical Decision Making: ? ?Independent interpretation of tests: ?Imaging: ? Review of patient's CT angio June 2021 images revealed no acute pulmonary process - had some mild ground glass opcaities in the upper lungs and a small 0.7 cm  RLL nodule which is unchanged when compared to previous. The patient's images have been independently reviewed by me.   ? ?PFTs: ?I have personally reviewed the patient's PFTs and none on file ?   ? View : No data to display.  ?  ?  ?  ? ? ?Labs:  ?Lab Results  ?Component Value Date  ? WBC 7.5 10/19/2020  ? HGB 15.1 10/19/2020  ? HCT 44.8 10/19/2020  ? MCV 89 10/19/2020  ? PLT 191 10/19/2020  ? ?Lab Results  ?Component Value Date  ? NA 139 08/04/2021  ? K 4.4 08/04/2021  ? CL 98 08/04/2021  ? CO2 26 08/04/2021  ? ? ? ?Immunization status:  ?Immunization History  ?Administered Date(s) Administered  ? Influenza, Seasonal, Injecte, Preservative Fre 01/25/2021  ? Influenza,inj,Quad PF,6+ Mos 02/11/2019, 02/10/2020, 01/25/2021  ? Influenza-Unspecified 02/11/2019, 02/10/2020, 01/25/2021  ? Moderna Sars-Covid-2 Vaccination 08/12/2019, 09/09/2019  ? Td 05/10/2003, 01/20/2017  ? Zoster Recombinat (Shingrix) 01/12/2018, 08/13/2018  ? ? ? I reviewed prior external note(s) from pcp ? I reviewed the result(s) of the labs and imaging as noted above.  ? I have ordered PFTs ? ? ?Assessment:  ?Shortness of breath ?OSA on CPAP ?Possible shift workers syndrome. ?Obesity Body mass index is 36.76 kg/m?10/13/2018 ?Passive smoke exposure ? ?Plan/Recommendations: ?Will obtain PFTs ?Trial of albuterol prn. ?Follow up with sleep medicine to see how cpap is working and consideration of simtulant for shift worker syndrome.  ? ?We discussed disease management and progression at length today.  ? ?Return to Care: ?Return in about 4 weeks (around 10/04/2021). ? ?10/06/2021, MD ?Pulmonary and Critical Care Medicine ?[facility] ?[phone] ? ?CC: Durel Salts, Cline ? ? ? ?

## 2021-10-07 ENCOUNTER — Telehealth: Payer: Self-pay | Admitting: Neurology

## 2021-10-07 DIAGNOSIS — G4733 Obstructive sleep apnea (adult) (pediatric): Secondary | ICD-10-CM

## 2021-10-07 DIAGNOSIS — R0902 Hypoxemia: Secondary | ICD-10-CM

## 2021-10-07 DIAGNOSIS — G4734 Idiopathic sleep related nonobstructive alveolar hypoventilation: Secondary | ICD-10-CM

## 2021-10-07 DIAGNOSIS — R400 Somnolence: Secondary | ICD-10-CM

## 2021-10-07 DIAGNOSIS — Z9981 Dependence on supplemental oxygen: Secondary | ICD-10-CM

## 2021-10-07 NOTE — Telephone Encounter (Signed)
At this juncture, I recommend, he come in for an in lab titration study to optimize treatment settings and monitor oxygen levels more accurately.  I would be happy to review a recent compliance download as well.  Please advise patient of this and order in lab titration study on behalf of Dr. Brett Fairy

## 2021-10-07 NOTE — Telephone Encounter (Signed)
Called the patient back. Advised that based off the ono results that were completed he should really continue to use the oxygen with his cpap. Informed the pt that I had the work in MD review this information to see her thoughts. I reviewed his CPAP DL which looks great. Apnea is well treated. I informed the work in would recommend that we bring him in for a titration study where we could better assess his cpap and oxygen levels and make sure he is tolerated well. Pt states that he sleeps well with the cpap and oxygen its just that he notices he wakes up feeling groggy or head fog the morning after when using the oxygen in cpap vs not.  Pt would like to wait and address this at [facility] upcoming apt with Dr Brett Fairy. Offered sooner and he preferred to keep apt as is but was appreciative for the call back.

## 2021-10-07 NOTE — Telephone Encounter (Signed)
Pt states that during the time he used the Oxygen and CPAP he would be very drowsy. Pt states a few days ago he stopped using the oxygen with the CPAP and has not felt drowsy, pt asking if he can come off of the oxygen.  Pt aware Dr Brett Fairy is out of office until 06-13, he still asked the message be sent.

## 2021-10-29 ENCOUNTER — Ambulatory Visit (INDEPENDENT_AMBULATORY_CARE_PROVIDER_SITE_OTHER): Payer: 59 | Admitting: Internal Medicine

## 2021-10-29 ENCOUNTER — Encounter: Payer: Self-pay | Admitting: Internal Medicine

## 2021-10-29 VITALS — BP 98/62 | HR 63 | Ht 70.0 in | Wt 254.0 lb

## 2021-10-29 DIAGNOSIS — G4733 Obstructive sleep apnea (adult) (pediatric): Secondary | ICD-10-CM

## 2021-10-29 DIAGNOSIS — Z9989 Dependence on other enabling machines and devices: Secondary | ICD-10-CM | POA: Diagnosis not present

## 2021-10-29 DIAGNOSIS — R0602 Shortness of breath: Secondary | ICD-10-CM

## 2021-10-29 LAB — PULMONARY FUNCTION TEST
DL/VA % pred: 120 %
DL/VA: 5.02 ml/min/mmHg/L
DLCO cor % pred: 83 %
DLCO cor: 22.71 ml/min/mmHg
DLCO unc % pred: 83 %
DLCO unc: 22.71 ml/min/mmHg
FEF 25-75 Post: 4.03 L/sec
FEF 25-75 Pre: 3.56 L/sec
FEF2575-%Change-Post: 13 %
FEF2575-%Pred-Post: 142 %
FEF2575-%Pred-Pre: 126 %
FEV1-%Change-Post: 2 %
FEV1-%Pred-Post: 75 %
FEV1-%Pred-Pre: 74 %
FEV1-Post: 2.66 L
FEV1-Pre: 2.61 L
FEV1FVC-%Change-Post: 0 %
FEV1FVC-%Pred-Pre: 115 %
FEV6-%Change-Post: 0 %
FEV6-%Pred-Post: 68 %
FEV6-%Pred-Pre: 68 %
FEV6-Post: 3.03 L
FEV6-Pre: 3.02 L
FEV6FVC-%Change-Post: -1 %
FEV6FVC-%Pred-Post: 103 %
FEV6FVC-%Pred-Pre: 105 %
FVC-%Change-Post: 1 %
FVC-%Pred-Post: 65 %
FVC-%Pred-Pre: 64 %
FVC-Post: 3.06 L
FVC-Pre: 3.02 L
Post FEV1/FVC ratio: 87 %
Post FEV6/FVC ratio: 99 %
Pre FEV1/FVC ratio: 86 %
Pre FEV6/FVC Ratio: 100 %
RV % pred: 72 %
RV: 1.67 L
TLC % pred: 71 %
TLC: 5.04 L

## 2021-10-29 MED ORDER — FLUTICASONE FUROATE-VILANTEROL 200-25 MCG/ACT IN AEPB: 1.0000 | INHALATION_SPRAY | Freq: Every day | RESPIRATORY_TRACT | 2 refills | Status: DC

## 2021-10-29 NOTE — Progress Notes (Signed)
Full PFT performed today. °

## 2021-10-31 ENCOUNTER — Other Ambulatory Visit: Payer: Self-pay | Admitting: Family Medicine

## 2021-10-31 DIAGNOSIS — I10 Essential (primary) hypertension: Secondary | ICD-10-CM

## 2021-11-01 ENCOUNTER — Ambulatory Visit (INDEPENDENT_AMBULATORY_CARE_PROVIDER_SITE_OTHER)
Admission: RE | Admit: 2021-11-01 | Discharge: 2021-11-01 | Disposition: A | Discharge status: Home / Self Care | Payer: 59 | Source: Ambulatory Visit | Attending: Cardiovascular Disease | Admitting: Cardiovascular Disease

## 2021-11-01 DIAGNOSIS — I7121 Aneurysm of the ascending aorta, without rupture: Secondary | ICD-10-CM | POA: Diagnosis not present

## 2021-11-01 DIAGNOSIS — I712 Thoracic aortic aneurysm, without rupture, unspecified: Secondary | ICD-10-CM | POA: Diagnosis not present

## 2021-11-01 MED ORDER — IOHEXOL 350 MG/ML SOLN
100.0000 mL | Freq: Once | INTRAVENOUS | Status: AC | PRN
Start: 2021-11-01 — End: 2021-11-01
  Administered 2021-11-01: 100 mL via INTRAVENOUS

## 2021-11-05 ENCOUNTER — Telehealth: Payer: Self-pay

## 2021-11-05 DIAGNOSIS — I77819 Aortic ectasia, unspecified site: Secondary | ICD-10-CM

## 2021-11-05 NOTE — Telephone Encounter (Signed)
-----   Message from Vesta Mixer, MD sent at 11/02/2021  5:00 PM EDT ----- Asc. Aorta dilatation  Basically unchanged from previous CT  Repeat CTA of the aorta in 1 year .   Continue to follow .  Stable aortic dilatation

## 2021-11-05 NOTE — Telephone Encounter (Signed)
Spoke with patient who understands results and agrees to repeat study in 1 year. Order placed at this time.

## 2021-11-08 ENCOUNTER — Other Ambulatory Visit: Payer: Self-pay | Admitting: Family Medicine

## 2021-11-08 DIAGNOSIS — K219 Gastro-esophageal reflux disease without esophagitis: Secondary | ICD-10-CM

## 2021-11-14 ENCOUNTER — Encounter: Payer: Self-pay | Admitting: Cardiovascular Disease

## 2021-11-14 NOTE — Progress Notes (Unsigned)
Jacob Cline Date of Birth  [birth year]       [facility]    [place] [address]  [address] [place], [place]  [address]   [place], [place]  [address] [phone]     [phone]   Fax  [phone]    Fax [phone]  Problem List: 1. Chest pain:  seen in the emergency room 2. Hypertension 3. Hyperlipidemia      I have seen [name] in the past for CP and abnormal ECG.  We have performed a cath in the past which was normal.   He also has had a stress myoview which was normal.    [name] had an episode of chest heaviness about a month ago.  Also associated with a head ache.  Was seen in the ER, work up was negative.   He has had some occasional recurent chest pain,.  Dull , shooting pain, comes and goes.   Also seems to have chest heaviness that comes on at random times ( not associated with any specific activity) which Seems to be relived with antiacids.  Has not seen his medical doctor.    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    He does not get any regular exercise.   He has taken Lovastatin in the past but stopped taking it due to  muscle aches.    Sep 10, 2013:  Stpehen was last seen in January, 2015. He had a stress Myoview study for recurrent episodes of chest discomfort.  The Myoview showed no evidence of ischemia. His left ventricular systolic function was normal with an EF of 57%.  Still having some CP.   He works 14 hour days ( 2 AM to 4 PM)   so has limited time to exercise.  Tries not to eat any fried foods by sometimes does not have a good option.   November 03, 2014:  [name] is doing well.  No CP.  Able to do all of his normal activities without any limitation .   Was seen by Dr. Christell Constant for pre-op visit and his ECG was found to be abnormal .    he's had T-wave inversions in the anterolateral leads for the past several years. He had a Myoview study last year which was normal. He had no evidence of  ischemia. He's done very well and has not had any cardiac symptoms.  April 23, 2018: Ms. seen back today for follow-up visit.  He was last seen 3 years ago. He has baseline T wave inversions in his lateral leads on his EKG. Is here for a check up  Has not had any particular issues.   No CP , no dyspnea. Works - drives a Multimedia programmer  Has developed leg swelling and chronic stasis changs  Still eats fast  Food  No regular exercise   November 19, 2018:  [name] is seen today for follow up for his CP and HTN Echocardiogram April 30, 2018 reveals normal left ventricular systolic function.  He has grade 2 diastolic dysfunction.  He has mild dilatation of the ascending aorta.  Doing well .   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 He has normal LV systolic function.  Grade 2 DD Mild dilatation of his asc. Aorta   Dealing with a pulled back currently  Some exercise  Avoids salt .   Breathing is good  Had been doing well with his diet before his back injury   November 15, 2021 [name] is seen for follow up of his CP and HTN Has grade II DD Mild dilatation of his asc. Aorta   CTA of the aorta June, 2023 .  She has an ascending aortic dimension of 41 mm at its greatest diameter  ( Was 40 mm in June 2021)   Still driving tractor trailer,  some activity with that  Not much cardio     Current Outpatient Medications on File Prior to Visit  Medication Sig Dispense Refill   acetaminophen (TYLENOL) 500 MG tablet Take by mouth.     albuterol (VENTOLIN HFA) 108 (90 Base) MCG/ACT inhaler Inhale 2 puffs into the lungs every 6 (six) hours as needed. 18 g 5   aspirin 81 MG chewable tablet Chew 81 mg by mouth daily.     cholecalciferol (VITAMIN D) 1000 UNITS tablet Take 1,000 Units by  mouth daily. 3 tablets daily     Cinnamon 500 MG capsule Take 1,000 mg by mouth daily.      diclofenac (VOLTAREN) 75 MG EC tablet Take 1 tablet by mouth twice daily as needed 60 tablet 5   magnesium oxide (MAG-OX) 400 MG tablet Take 400 mg by mouth daily.     Multiple Vitamin (MULTIVITAMIN) tablet Take 1 tablet by mouth daily.      Omega-3 Fatty Acids (FISH OIL) 1000 MG CAPS Take 1 capsule by mouth daily.      omeprazole (PRILOSEC) 20 MG capsule TAKE 1 CAPSULE BY MOUTH TWICE DAILY BEFORE MEAL(S) 60 capsule 2   PARoxetine (PAXIL-CR) 25 MG 24 hr tablet Take 1 tablet (25 mg total) by mouth daily. 90 tablet 3   pravastatin (PRAVACHOL) 40 MG tablet Take 1 tablet (40 mg total) by mouth at bedtime. 90 tablet 3   telmisartan (MICARDIS) 40 MG tablet Take 1 tablet by mouth once daily 30 tablet 0   triamterene-hydrochlorothiazide (MAXZIDE-25) 37.5-25 MG tablet Take 1 tablet by mouth once daily 30 tablet 0   TURMERIC PO Take 1 capsule by mouth daily.      Vitamin D, Ergocalciferol, (DRISDOL) 1.25 MG (50000 UNIT) CAPS capsule Take 1 capsule (50,000 Units total) by mouth every 7 (seven) days. 4 capsule 0   gabapentin (NEURONTIN) 300 MG capsule Take 300 mg by mouth at bedtime. (Patient not taking: Reported on 10/29/2021)     No current facility-administered medications on file prior to visit.    Allergies  Allergen Reactions   Other Anaphylaxis    Make me hurt alot   Codeine     Unknown    Lovastatin     Makes me hurt alot   Niaspan [Niacin Er]     Make me hurt alot    Past Medical History:  Diagnosis Date   Aortic aneurysm (HCC)    Back pain    BPH (benign prostatic hypertrophy)    Cyst of left kidney    Edema of both lower extremities    GERD (gastroesophageal reflux disease)    Hemorrhoids    Hiatal hernia    Hyperlipidemia    Hypertension    Irregular heart beat    Joint pain    Obesity    Post-operative  nausea and vomiting    Vitamin D deficiency     Past Surgical History:   Procedure Laterality Date   athroscopy of rt knee  02/02   henia - umbilical surgery  07/17/07   HERNIA REPAIR  05/15/2017   double hernia surgery   HIP SURGERY  11/2014   right hip   MOUTH SURGERY     VASECTOMY      Social History   Tobacco Use  Smoking Status Never  Smokeless Tobacco Never    Social History   Substance and Sexual Activity  Alcohol Use No   Alcohol/week: 0.0 standard drinks of alcohol    Family History  Problem Relation Age of Onset   Diabetes Mother    Scleroderma Sister    Diabetes Brother    Diabetes Brother    Colon cancer Other        family history    Prostate cancer Other        family history    Lung disease Neg Hx     Reviw of Systems:  Reviewed in the HPI.  All other systems are negative.  Physical Exam: Blood pressure 124/80, pulse (!) 59, height 5\' 10"  (1.778 m), weight 254 lb 9.6 oz (115.5 kg), SpO2 96 %.  GEN:  Well nourished, well developed in no acute distress HEENT: Normal NECK: No JVD; No carotid bruits LYMPHATICS: No lymphadenopathy CARDIAC: RRR , no murmurs, rubs, gallops RESPIRATORY:  Clear to auscultation without rales, wheezing or rhonchi  ABDOMEN: Soft, non-tender, non-distended MUSCULOSKELETAL:  No edema; No deformity  SKIN: Warm and dry NEUROLOGIC:  Alert and oriented x 3     ECG:  November 15, 2021 Sinus brady. 59 .  RBBB      Assessment / Plan:    1. Chest pain:       He denies having any episodes of chest pain.   2. Hypertension -blood pressure is well controlled.  I have advised him to work on weight loss.  Continue current medications.   3.   Mild asc. Aortic aneurism:   He has a very small ascending aortic aneurysm.  His ascending aorta now measures 41 mm.  It was 40 mm back in 2021.  We will continue to get yearly coronary CT angiograms.    4.  Obesity:     Advised him to work on weight loss.  4. Hyperlipidemia Managed by his primary    2022, MD  11/15/2021 9:57 AM    [facility] [address] [place],  [address] [place], [place]  [place] Pager [phone] Phone: [phone]; Fax: [phone]

## 2021-11-15 ENCOUNTER — Other Ambulatory Visit: Payer: Self-pay | Admitting: Family Medicine

## 2021-11-15 ENCOUNTER — Encounter: Payer: Self-pay | Admitting: Cardiovascular Disease

## 2021-11-15 ENCOUNTER — Ambulatory Visit (INDEPENDENT_AMBULATORY_CARE_PROVIDER_SITE_OTHER): Payer: 59 | Admitting: Cardiovascular Disease

## 2021-11-15 ENCOUNTER — Telehealth: Payer: Self-pay

## 2021-11-15 VITALS — BP 124/80 | HR 59 | Ht 70.0 in | Wt 254.6 lb

## 2021-11-15 DIAGNOSIS — I10 Essential (primary) hypertension: Secondary | ICD-10-CM | POA: Diagnosis not present

## 2021-11-15 DIAGNOSIS — I7121 Aneurysm of the ascending aorta, without rupture: Secondary | ICD-10-CM

## 2021-11-15 DIAGNOSIS — Z01810 Encounter for preprocedural cardiovascular examination: Secondary | ICD-10-CM

## 2021-11-15 DIAGNOSIS — E7800 Pure hypercholesterolemia, unspecified: Secondary | ICD-10-CM

## 2021-11-15 NOTE — Patient Instructions (Signed)
Medication Instructions:  Your physician recommends that you continue on your current medications as directed. Please refer to the Current Medication list given to you today.  *If you need a refill on your cardiac medications before your next appointment, please call your pharmacy*   Lab Work: NONE If you have labs (blood work) drawn today and your tests are completely normal, you will receive your results only by: MyChart Message (if you have MyChart) OR A paper copy in the mail If you have any lab test that is abnormal or we need to change your treatment, we will call you to review the results.  Testing/Procedures: CTA Aorta (in 1 year) Your physician has requested that you have cardiac CT. Cardiac computed tomography (CT) is a painless test that uses an x-ray machine to take clear, detailed pictures of your heart. For further information please visit [email or web address] Please follow instruction sheet as given.  Follow-Up: At [name], you and your health needs are our priority.  As part of our continuing mission to provide you with exceptional heart care, we have created designated Provider Care Teams.  These Care Teams include your primary Cardiologist (physician) and Advanced Practice Providers (APPs -  Physician Assistants and Nurse Practitioners) who all work together to provide you with the care you need, when you need it.  Your next appointment:   1 year(s)  The format for your next appointment:   In Person  Provider:   Suzzanne Cloud, or Nahser {    Important Information About Sugar

## 2021-11-15 NOTE — Telephone Encounter (Signed)
Pt needed lab orders placed for pre-CTA in one year

## 2021-11-22 ENCOUNTER — Ambulatory Visit (INDEPENDENT_AMBULATORY_CARE_PROVIDER_SITE_OTHER): Payer: 59 | Admitting: Family Medicine

## 2021-11-22 ENCOUNTER — Encounter: Payer: Self-pay | Admitting: Family Medicine

## 2021-11-22 VITALS — BP 106/64 | HR 64 | Temp 97.5°F | Ht 70.0 in | Wt 252.6 lb

## 2021-11-22 DIAGNOSIS — R7303 Prediabetes: Secondary | ICD-10-CM | POA: Diagnosis not present

## 2021-11-22 DIAGNOSIS — I10 Essential (primary) hypertension: Secondary | ICD-10-CM | POA: Diagnosis not present

## 2021-11-22 DIAGNOSIS — N281 Cyst of kidney, acquired: Secondary | ICD-10-CM

## 2021-11-22 DIAGNOSIS — M85852 Other specified disorders of bone density and structure, left thigh: Secondary | ICD-10-CM

## 2021-11-22 DIAGNOSIS — F411 Generalized anxiety disorder: Secondary | ICD-10-CM

## 2021-11-22 DIAGNOSIS — R0609 Other forms of dyspnea: Secondary | ICD-10-CM

## 2021-11-22 DIAGNOSIS — E7800 Pure hypercholesterolemia, unspecified: Secondary | ICD-10-CM | POA: Diagnosis not present

## 2021-11-22 DIAGNOSIS — F339 Major depressive disorder, recurrent, unspecified: Secondary | ICD-10-CM

## 2021-11-22 LAB — BAYER DCA HB A1C WAIVED: HB A1C (BAYER DCA - WAIVED): 5.6 % (ref 4.8–5.6)

## 2021-11-22 MED ORDER — PRAVASTATIN SODIUM 40 MG PO TABS: 40.0000 mg | ORAL_TABLET | Freq: Every day | ORAL | 3 refills | Status: DC

## 2021-11-22 MED ORDER — PAROXETINE HCL ER 25 MG PO TB24: 25.0000 mg | ORAL_TABLET | Freq: Every day | ORAL | 3 refills | Status: DC

## 2021-11-22 MED ORDER — TELMISARTAN 40 MG PO TABS: 40.0000 mg | ORAL_TABLET | Freq: Every day | ORAL | 3 refills | Status: DC

## 2021-11-22 MED ORDER — TRIAMTERENE-HCTZ 37.5-25 MG PO TABS: 1.0000 | ORAL_TABLET | Freq: Every day | ORAL | 3 refills | Status: DC

## 2021-11-22 NOTE — Patient Instructions (Signed)
You had labs performed today.  You will be contacted with the results of the labs once they are available, usually in the next 3 business days for routine lab work.  If you have an active my chart account, they will be released to your MyChart.  If you prefer to have these labs released to you via telephone, please let us know.     

## 2021-11-22 NOTE — Progress Notes (Signed)
Subjective: CC: Chronic follow-up PCP: Janora Norlander, DO UXL:[id number] H Corkum is a 61 y.o. male presenting to clinic today for:  1.  Dyspnea on exertion Patient saw his pulmonologist, had pulmonary function testing done and was told that he may have a component of mild asthma.  Breo was prescribed but he notes he never proceeded with this because he thinks there was a misunderstanding with regards to his albuterol use.  He was not utilizing it every 6 hours because he needed it but rather because he thought he was supposed to do that every 6 hours.  He really has not used that all since that visit and only reports 1 episode of feeling a little dyspneic and this was after he did some work outside.  He wonders if he should proceed with the [facility] today or not.  He does not want to take something he does not really need  2.  Osteopenia Patient found to have osteopenia by his orthopedist.  He had DEXA scan recently which showed -1.2 T score of the left femur.  FRAX score for major osteoporotic fracture in 10 years was 8.6% and for hip fracture was 0.9%.  Follow-up and treatment of this DEXA was deferred to me.  He is not treated for osteoporosis or osteopenia.  3.  Prediabetes/hyperlipidemia/hypertension Patient reports that he really has been trying to clean up his diet and is interested to see what his blood sugar is doing.  His last 1 was 90 and he was quite happy about the reduction of the sugar back into normal range.  He is hopeful that this will be reflected today as well.  He is compliant with his pravastatin, Micardis 40 mg daily, Maxide 37.5-25 mg daily.  No chest pain, edema, falls or dizziness reported.  4.  Renal cyst Patient rings in copies of his renal cyst imaging.  He just wanted to clarify if there was any further work-up needed of this.   ROS: Per HPI  Allergies  Allergen Reactions   Other Anaphylaxis    Make me hurt alot   Codeine     Unknown    Lovastatin     Makes  me hurt alot   Niaspan [Niacin Er]     Make me hurt alot   Past Medical History:  Diagnosis Date   Aortic aneurysm (HCC)    Back pain    BPH (benign prostatic hypertrophy)    Cyst of left kidney    Edema of both lower extremities    GERD (gastroesophageal reflux disease)    Hemorrhoids    Hiatal hernia    Hyperlipidemia    Hypertension    Irregular heart beat    Joint pain    Obesity    Post-operative nausea and vomiting    Vitamin D deficiency     Current Outpatient Medications:    acetaminophen (TYLENOL) 500 MG tablet, Take by mouth., Disp: , Rfl:    albuterol (VENTOLIN HFA) 108 (90 Base) MCG/ACT inhaler, Inhale 2 puffs into the lungs every 6 (six) hours as needed., Disp: 18 g, Rfl: 5   aspirin 81 MG chewable tablet, Chew 81 mg by mouth daily., Disp: , Rfl:    Cinnamon 500 MG capsule, Take 1,000 mg by mouth daily. , Disp: , Rfl:    diclofenac (VOLTAREN) 75 MG EC tablet, Take 1 tablet by mouth twice daily as needed, Disp: 60 tablet, Rfl: 5   gabapentin (NEURONTIN) 300 MG capsule, Take 300 mg by  mouth at bedtime., Disp: , Rfl:    magnesium oxide (MAG-OX) 400 MG tablet, Take 400 mg by mouth daily., Disp: , Rfl:    Multiple Vitamin (MULTIVITAMIN) tablet, Take 1 tablet by mouth daily. , Disp: , Rfl:    Omega-3 Fatty Acids (FISH OIL) 1000 MG CAPS, Take 1 capsule by mouth daily. , Disp: , Rfl:    omeprazole (PRILOSEC) 20 MG capsule, TAKE 1 CAPSULE BY MOUTH TWICE DAILY BEFORE MEAL(S), Disp: 60 capsule, Rfl: 2   PARoxetine (PAXIL-CR) 25 MG 24 hr tablet, Take 1 tablet (25 mg total) by mouth daily., Disp: 90 tablet, Rfl: 3   pravastatin (PRAVACHOL) 40 MG tablet, TAKE 1 TABLET BY MOUTH AT BEDTIME, Disp: 30 tablet, Rfl: 0   telmisartan (MICARDIS) 40 MG tablet, Take 1 tablet by mouth once daily, Disp: 30 tablet, Rfl: 0   triamterene-hydrochlorothiazide (MAXZIDE-25) 37.5-25 MG tablet, Take 1 tablet by mouth once daily, Disp: 30 tablet, Rfl: 0   TURMERIC PO, Take 1 capsule by mouth daily. ,  Disp: , Rfl:    Vitamin D, Ergocalciferol, (DRISDOL) 1.25 MG (50000 UNIT) CAPS capsule, Take 1 capsule (50,000 Units total) by mouth every 7 (seven) days., Disp: 4 capsule, Rfl: 0 Social History   Socioeconomic History   Marital status: Married    Spouse name: Not on file   Number of children: Not on file   Years of education: Not on file   Highest education level: Not on file  Occupational History   Occupation: Full time   Occupation: Administrator  Tobacco Use   Smoking status: Never   Smokeless tobacco: Never  Vaping Use   Vaping Use: Never used  Substance and Sexual Activity   Alcohol use: No    Alcohol/week: 0.0 standard drinks of alcohol   Drug use: No   Sexual activity: Not on file  Other Topics Concern   Not on file  Social History Narrative   Not on file   Social Determinants of Health   Financial Resource Strain: Not on file  Food Insecurity: Not on file  Transportation Needs: Not on file  Physical Activity: Not on file  Stress: Not on file  Social Connections: Not on file  Intimate Partner Violence: Not on file   Family History  Problem Relation Age of Onset   Diabetes Mother    Scleroderma Sister    Diabetes Brother    Diabetes Brother    Colon cancer Other        family history    Prostate cancer Other        family history    Lung disease Neg Hx     Objective: Office vital signs reviewed. BP 106/64   Pulse 64   Temp (!) 97.5 F (36.4 C)   Ht 5' 10"  (1.778 m)   Wt 252 lb 9.6 oz (114.6 kg)   SpO2 95%   BMI 36.24 kg/m   Physical Examination:  General: Awake, alert, well nourished, morbidly obese.  No acute distress HEENT: Sclera white.  Moist mucous membranes.  No carotid bruits Cardio: regular rate and rhythm, S1S2 heard, no murmurs appreciated Pulm: clear to auscultation bilaterally, no wheezes, rhonchi or rales; normal work of breathing on room air Extremities: warm, well perfused, No edema, cyanosis or clubbing; +2 pulses  bilaterally MSK: normal gait and station Neuro: No focal neurologic deficits  Assessment/ Plan: 61 y.o. male   Essential hypertension - Plan: CMP14+EGFR, telmisartan (MICARDIS) 40 MG tablet, triamterene-hydrochlorothiazide (MAXZIDE-25) 37.5-25 MG tablet  Pure hypercholesterolemia - Plan: pravastatin (PRAVACHOL) 40 MG tablet  Pre-diabetes - Plan: Bayer DCA Hb A1c Waived  Morbid obesity ([place]) - Plan: CMP14+EGFR, Lipid Panel  Benign cyst of left kidney  Dyspnea on exertion  Osteopenia of left femoral neck - Plan: CMP14+EGFR  Depression, recurrent (HCC) - Plan: PARoxetine (PAXIL-CR) 25 MG 24 hr tablet  GAD (generalized anxiety disorder) - Plan: PARoxetine (PAXIL-CR) 25 MG 24 hr tablet  Blood pressure is controlled and in fact we may consider reducing his Micardis even further as he is on the low end of normal.  Check fasting labs.  Continue lifestyle modification to reduce weight and reduce progression of any type of cardiovascular disease.  We reviewed all of his labs and imaging studies today.  I will scan and his DEXA results.  No further work-up with proteinaceous cyst noted on the left kidney needed.  Encouraged him to continue weight loss to improve breathing.  I agree that Memory Dance is not needed if he in fact is not requiring the albuterol more than twice per week.  We discussed indications for proceeding with that inhaler.  Keep follow-up with pulmonology as directed  Vitamin D, calcium and weightbearing exercises are recommended for the osteopenia noted in the left femoral neck.  We will plan for DEXA scan in 2 years.  We did not discuss depression or anxiety today but refills have been sent    No orders of the defined types were placed in this encounter.  No orders of the defined types were placed in this encounter.    Janora Norlander, DO [place] [phone]

## 2021-11-23 LAB — LIPID PANEL
Chol/HDL Ratio: 3.1 ratio (ref 0.0–5.0)
Cholesterol, Total: 138 mg/dL (ref 100–199)
HDL: 44 mg/dL (ref >39)
LDL Chol Calc (NIH): 80 mg/dL (ref 0–99)
Triglycerides: 71 mg/dL (ref 0–149)
VLDL Cholesterol Cal: 14 mg/dL (ref 5–40)

## 2021-11-23 LAB — CMP14+EGFR
ALT: 30 IU/L (ref 0–44)
AST: 22 IU/L (ref 0–40)
Albumin/Globulin Ratio: 2 (ref 1.2–2.2)
Albumin: 4.5 g/dL (ref 3.9–4.9)
Alkaline Phosphatase: 97 IU/L (ref 44–121)
BUN/Creatinine Ratio: 28 — ABNORMAL HIGH (ref 10–24)
BUN: 27 mg/dL (ref 8–27)
Bilirubin Total: 0.4 mg/dL (ref 0.0–1.2)
CO2: 26 mmol/L (ref 20–29)
Calcium: 9.4 mg/dL (ref 8.6–10.2)
Chloride: 103 mmol/L (ref 96–106)
Creatinine, Ser: 0.97 mg/dL (ref 0.76–1.27)
Globulin, Total: 2.2 g/dL (ref 1.5–4.5)
Glucose: 109 mg/dL — ABNORMAL HIGH (ref 70–99)
Potassium: 4.9 mmol/L (ref 3.5–5.2)
Sodium: 143 mmol/L (ref 134–144)
Total Protein: 6.7 g/dL (ref 6.0–8.5)
eGFR: 88 mL/min/{1.73_m2} (ref >59)

## 2021-12-13 ENCOUNTER — Encounter: Payer: Self-pay | Admitting: Neurology

## 2021-12-13 ENCOUNTER — Ambulatory Visit (INDEPENDENT_AMBULATORY_CARE_PROVIDER_SITE_OTHER): Payer: 59 | Admitting: Neurology

## 2021-12-13 VITALS — BP 115/75 | HR 75 | Ht 70.0 in | Wt 254.5 lb

## 2021-12-13 DIAGNOSIS — I5032 Chronic diastolic (congestive) heart failure: Secondary | ICD-10-CM | POA: Diagnosis not present

## 2021-12-13 DIAGNOSIS — G4734 Idiopathic sleep related nonobstructive alveolar hypoventilation: Secondary | ICD-10-CM | POA: Diagnosis not present

## 2021-12-13 DIAGNOSIS — G4733 Obstructive sleep apnea (adult) (pediatric): Secondary | ICD-10-CM

## 2021-12-13 DIAGNOSIS — Z9989 Dependence on other enabling machines and devices: Secondary | ICD-10-CM | POA: Diagnosis not present

## 2021-12-13 NOTE — Patient Instructions (Signed)
Hypoxia Hypoxia is a condition that happens when there is a lack of oxygen in the body's tissues and organs. When there is not enough oxygen, organs cannot work as they should. This causes serious problems throughout the body and in the brain. What are the causes? This condition may be caused by: Exposure to high altitude. A collapsed lung (pneumothorax). Lung infection (pneumonia). Lung injury. Long-term (chronic) lung disease, such as chronic obstructive pulmonary disease (COPD) or emphysema. Fluid collecting in the chest cavity (congestive heart failure), or blood collecting in the chest cavity (hemothorax). Food, saliva, or vomit getting into the airway (aspiration). Reduced blood flow (ischemia). Severe blood loss. Slow or shallow breathing (hypoventilation). Blood disorders, such as anemia. Carbon monoxide or cyanide poisoning. The heart suddenly stopping (cardiac arrest). Medicines or recreational drugs with severe sedating effects. Drowning. Choking. What are the signs or symptoms? Symptoms of this condition include: Headache. Feeling tired (fatigue). Forgetfulness. Nausea. Confusion. Shortness of breath. Dizziness. Bluish color of the skin, lips, or nail beds (cyanosis). Change in consciousness or awareness. If hypoxia is not treated, it can lead to convulsions, loss of consciousness (coma), or brain damage, which can be life-threatening. How is this diagnosed? This condition may be diagnosed based on: A physical exam. Blood tests. A test that measures how much oxygen is in your blood (pulse oximetry). This is done with a sensor that is placed on your finger, toe, or earlobe. Imaging, such as a chest X-ray or CT scan. Tests to check your lung function (pulmonary function tests). A test to check the electrical activity of your heart (electrocardiogram, ECG). You may have other tests to determine the cause of your hypoxia. How is this treated?  Treatment for this  condition depends on what is causing the hypoxia. You will likely be treated with oxygen therapy. This may be done by giving you oxygen through a face mask or through tubes in your nose. Your health care provider may also recommend other therapies to treat the underlying cause of your hypoxia. Follow these instructions at home: Take over-the-counter and prescription medicines only as told by your health care provider. Do not use any products that contain nicotine or tobacco. These products include cigarettes, chewing tobacco, and vaping devices, such as e-cigarettes. If you need help quitting, ask your health care provider. Avoid secondhand smoke. Work with your health care provider to manage any chronic conditions you have that may be causing hypoxia, such as COPD. Keep all follow-up visits. This is important. Contact a health care provider if: You have a fever. You become extremely short of breath when you exercise. Get help right away if: Your shortness of breath gets worse, especially with normal or very little activity. You have trouble breathing, even after treatment. Your skin, lips, or nail beds have a bluish color. You become confused or you cannot think properly. You have chest pain. These symptoms may be an emergency. Get help right away. Call 911. Do not wait to see if the symptoms will go away. Do not drive yourself to the hospital. Summary Hypoxia is a condition that happens when there is a lack of oxygen in the body's tissues and organs. If hypoxia is not treated, it can lead to convulsions, loss of consciousness (coma), or brain damage. Symptoms of hypoxia can include a headache, shortness of breath, confusion, nausea, and a bluish skin color. Hypoxia has many possible causes, including exposure to high altitude, carbon monoxide poisoning, or other health issues, such as blood disorders or   cardiac arrest. Hypoxia is usually treated with oxygen therapy. This information is  not intended to replace advice given to you by your health care provider. Make sure you discuss any questions you have with your health care provider. Document Revised: 11/24/2020 Document Reviewed: 11/24/2020 [employer] Patient Education  2023 [employer]  

## 2021-12-13 NOTE — Progress Notes (Signed)
SLEEP MEDICINE CLINIC    Provider:  Melvyn Novas, MD  Primary Care Physician:  [name], DO [address] [place] [address]     Referring Provider: Jeronimo Greaves [address] 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 HTN and hypercholesterolemia       HISTORY OF PRESENT ILLNESS:  Jacob Cline is a 4. year -old  Caucasian male patient seen here in a RV on 12/13/2021. 12-13-2021: Mr. Belisle underwent an overnight pulse oximetry in late 2022, which showed that he had 2 hours and 19 minutes of low oxygen saturation over the course of 7-1/2 hours at night.  This study was done while he was on CPAP.  For this reason he was asked to continue using CPAP at home and he is bleeding 2 L/min into his CPAP mask while using CPAP.  His compliance for CPAP has been excellent the compliance report from May and June is 100% compliance for days and hours with a residual AHI of only 0.7/h.  The 95th percentile pressure was 12.5 cm water.  As of July into August he had a 97% compliance his AHI was also only 0.7/h residual.  His 95th percentile pressure was 13.2/h.  There were no central apneas emerging.  Unfortunately he feels actually not more rested and may be even more tired while using oxygen the fatigue severity score was not obtained today but his Epworth Sleepiness Scale speaks for a low degree of daytime sleepiness 5 out of 24 points and he is not depressed according to his geriatric depression score.  I reviewed his medications and except for Neurontin I see no medications that his sleep inducing or sedating. He sys now he stopped the medication and felt better.       rom DOT/ Dr Nadine Counts. Chief concern according to patient :  see above.  I have the pleasure to see our  mutual patient again today, 12-07-2020, Mr. Dacoda Finlay underwent a home sleep test on August 31, 2020 in which he collected 6 hours and 34 minutes of total sleep time but almost no REM sleep was identified.  He had a very severe form of apnea with an AHI of 53.7 and his pulse range was between 47 and 103 bpm.  62 minutes were under 90% oxygen saturation with a nadir of oxygenation at 57%.  Based on these constellation was only a CPAP or BiPAP therapy was recommended and the patient was finally able to obtain an auto titration CPAP with a setting between 6 and 18 cmH2O, 2 cm EPR, and a mask of choice with heated humidification.  [facility] was [facility] to [facility] and his 30-day download shows that he is 100% compliant by hours and days.  He uses on average CPAP for 7 hours and 2 minutes at night.  His minimum pressure of 6 maximum pressure is 18 and EPR level was in the get set to 2 cmH2O his AHI is 1.3/h.  This is the 95th percent resolution of his pressure at the 95th percentile is 13.2/h this means he has been served under the current settings.  Leak is moderate.  His sleepiness score by Epworth score  is 3 cm 3 points.  He is not depressed and he has no new medications.  Based on his compliance he is to be seen again in 1 year.  There has to be no changes in settings.  I will offer an overnight pulse oximetry if not already obtained.  Plan; ONO while on auto pap. He is 100%  compliant. He failed a nasal pillow, and switched to FFM , considers himself a mouth breather.   Jacqulyn CaneJames H Etienne  has a past medical history of Aortic aneurysm (HCC), Back pain, BPH (benign prostatic hypertrophy), Cyst of left kidney, Edema of both lower extremities, GERD (gastroesophageal reflux disease), Hemorrhoids, Hiatal hernia, Hyperlipidemia, Hypertension, Irregular heart beat, Joint pain, Obesity, Post-operative nausea and vomiting, and Vitamin D deficiency.     Sleep relevant medical history: Nocturia 2-3, cervical DDD. Family medical  /sleep history: NO other family member on CPAP with OSA, insomnia, sleep walkers. brother has AAA. 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  Tobacco use  Never-.  ETOH use: 3-4 week max. Caffeine intake in form of Coffee( 2 ) Soda( 2 at night )  Regular exercise ; none.  Hobbies : few.    Sleep habits are as follows: The patient's dinner time is packed - 2-3 AM  - The patient [facility] and continues to sleep for 6 hours, wakes for 2-3 bathroom breaks.  Sleeps a longer time on weekends.  The preferred sleep position is sideways - and snores loudly- , with the support of 1 pillow.. Dreams are reportedly infrequent.  10 PM is the usual rise time. The patient wakes up with an alarm.  He reports not feeling fully refreshed or restored in AM, with symptoms such as dry mouth, morning headaches, and residual fatigue.  Naps are taken frequently.     Review of Systems: Out of a complete 14 system review, the patient complains of only the following symptoms, and all other reviewed systems are negative.:  Fatigue, sleepiness , loud snoring, fragmented sleep, nocturia- night shift worker.   No morning headaches.    How likely are you to doze in the following situations: 0 = not likely, 1 = slight chance, 2 = moderate chance, 3 = high chance   Sitting and Reading? Watching Television? Sitting inactive in a public place (theater or meeting)? As a passenger in a car for an hour without a break? Lying down in the afternoon when circumstances permit? Sitting and talking to someone? Sitting quietly after lunch without alcohol? In a car, while stopped for a few minutes in traffic?   Total = 5/ 24 points   FSS endorsed at 31/ 63 points.   Social History   Socioeconomic History    Marital status: Married    Spouse name: Not on file   Number of children: Not on file   Years of education: Not on file   Highest education level: Not on file  Occupational History   Occupation: Full time   Occupation: Truck Hospital doctorDriver  Tobacco Use   Smoking status: Never   Smokeless tobacco: Never  Vaping Use   Vaping Use: Never used  Substance and Sexual Activity   Alcohol use: No    Alcohol/week: 0.0 standard drinks of alcohol  Drug use: No   Sexual activity: Not on file  Other Topics Concern   Not on file  Social History Narrative   Not on file   Social Determinants of Health   Financial Resource Strain: Not on file  Food Insecurity: Not on file  Transportation Needs: Not on file  Physical Activity: Not on file  Stress: Not on file  Social Connections: Not on file    Family History  Problem Relation Age of Onset   Diabetes Mother    Scleroderma Sister    Diabetes Brother    Diabetes Brother    Colon cancer Other        family history    Prostate cancer Other        family history    Lung disease Neg Hx     Past Medical History:  Diagnosis Date   Aortic aneurysm (HCC)    Back pain    BPH (benign prostatic hypertrophy)    Cyst of left kidney    Edema of both lower extremities    GERD (gastroesophageal reflux disease)    Hemorrhoids    Hiatal hernia    Hyperlipidemia    Hypertension    Irregular heart beat    Joint pain    Obesity    Post-operative nausea and vomiting    Vitamin D deficiency     Past Surgical History:  Procedure Laterality Date   athroscopy of rt knee  02/02   henia - umbilical surgery  07/17/07   HERNIA REPAIR  05/15/2017   double hernia surgery   HIP SURGERY  11/2014   right hip   MOUTH SURGERY     VASECTOMY       Current Outpatient Medications on File Prior to Visit  Medication Sig Dispense Refill   acetaminophen (TYLENOL) 500 MG tablet Take by mouth.     albuterol (VENTOLIN HFA) 108 (90 Base) MCG/ACT inhaler Inhale 2  puffs into the lungs every 6 (six) hours as needed. 18 g 5   aspirin 81 MG chewable tablet Chew 81 mg by mouth daily.     Cinnamon 500 MG capsule Take 1,000 mg by mouth daily.      diclofenac (VOLTAREN) 75 MG EC tablet Take 1 tablet by mouth twice daily as needed 60 tablet 5   gabapentin (NEURONTIN) 300 MG capsule Take 300 mg by mouth at bedtime.     magnesium oxide (MAG-OX) 400 MG tablet Take 400 mg by mouth daily.     Multiple Vitamin (MULTIVITAMIN) tablet Take 1 tablet by mouth daily.      Omega-3 Fatty Acids (FISH OIL) 1000 MG CAPS Take 1 capsule by mouth daily.      omeprazole (PRILOSEC) 20 MG capsule TAKE 1 CAPSULE BY MOUTH TWICE DAILY BEFORE MEAL(S) 60 capsule 2   PARoxetine (PAXIL-CR) 25 MG 24 hr tablet Take 1 tablet (25 mg total) by mouth daily. 90 tablet 3   pravastatin (PRAVACHOL) 40 MG tablet Take 1 tablet (40 mg total) by mouth at bedtime. 90 tablet 3   telmisartan (MICARDIS) 40 MG tablet Take 1 tablet (40 mg total) by mouth daily. 90 tablet 3   triamterene-hydrochlorothiazide (MAXZIDE-25) 37.5-25 MG tablet Take 1 tablet by mouth daily. 90 tablet 3   TURMERIC PO Take 1 capsule by mouth daily.      Vitamin D, Ergocalciferol, (DRISDOL) 1.25 MG (50000 UNIT) CAPS capsule Take 1 capsule (50,000 Units total) by mouth every 7 (seven) days. 4 capsule 0   No  current facility-administered medications on file prior to visit.    Allergies  Allergen Reactions   Other Anaphylaxis    Make me hurt alot   Codeine     Unknown    Lovastatin     Makes me hurt alot   Niaspan [Niacin Er]     Make me hurt alot    Physical exam:  Today's Vitals   12/13/21 0814  BP: 115/75  Pulse: 75  Weight: 254 lb 8 oz (115.4 kg)  Height: 5\' 10"  (1.778 m)   Body mass index is 36.52 kg/m.   Wt Readings from Last 3 Encounters:  12/13/21 254 lb 8 oz (115.4 kg)  11/22/21 252 lb 9.6 oz (114.6 kg)  11/15/21 254 lb 9.6 oz (115.5 kg)     Ht Readings from Last 3 Encounters:  12/13/21 5\' 10"  (1.778 m)   11/22/21 5\' 10"  (1.778 m)  11/15/21 5\' 10"  (1.778 m)      General: The patient is awake, alert and appears not in acute distress. The patient is  groomed. Head: Normocephalic, atraumatic. Neck is supple.  Mallampati 2  neck circumference:18 inches .  Nasal airflow patent.   Retrognathia is not  seen.  Facial hair.  Dental status:  Cardiovascular:  Regular rate and cardiac rhythm by pulse,  without distended neck veins. Respiratory: Lungs are clear to auscultation.  Skin:  With evidence of ankle edema, no rash. Trunk: The patient's posture is erect.   Neurologic exam : The patient is awake and alert, oriented to place and time.   Memory subjective described as intact.  Attention span & concentration ability appears normal.  Speech is fluent,  without  dysarthria, dysphonia or aphasia.  Mood and affect are appropriate.   Cranial nerves: no loss of smell or taste reported  Pupils are equal and briskly reactive to light.  Funduscopic exam deferred. No cataracts.   Extraocular movements in vertical and horizontal planes were intact and without nystagmus. No Diplopia. Visual fields by finger perimetry are intact. Hearing was intact to soft voice and finger rubbing. Facial sensation intact to fine touch.  Facial motor strength is symmetric and tongue and uvula move midline.  Neck ROM : rotation, tilt and flexion extension were normal for age and shoulder shrug was symmetrical.    Motor exam:  Symmetric bulk, tone and ROM.   Normal tone without cog wheeling, symmetric grip strength .   Sensory:  Fine touch, pinprick and vibration were normal.  Proprioception tested in the upper extremities was normal.   Coordination: Rapid alternating movements in the fingers/hands were of normal speed.  The Finger-to-nose maneuver was intact without evidence of ataxia, dysmetria or tremor.   Gait and station: Patient could rise unassisted from a seated position, walked without assistive device.   Stance is of normal width/ base and the patient turned with 3 steps.  Toe and heel walk were deferred.  Deep tendon reflexes: in the  upper and lower extremities are symmetric and intact.  Babinski response was deferred.        After spending a total time of  45 minutes face to face and additional time for physical and neurologic examination, review of laboratory studies,  personal review of imaging studies, reports and results of other testing and review of referral information / records as far as provided in visit, I have established the following assessments: His BMI is elevated at 37, he does have a moderate upper airway obstruction, dental status is intact, no  crowding he does have a bridge on the left lower side no dentures.  He reports some swelling in the ankles and this is most likely attributable to Norvasc-amlodipine.  He is also on a cholesterol-lowering statin.  His markers are such that he is at a higher risk of having obstructive sleep apnea and his wife has told him that he snores and rather loudly.  He is treated for GERD so I do not think that acid reflux creates any apnea in him.  I will order a home sleep test for him and will [place] as urgent I will ask the technologist also to max the study as urgent for DOT.  We will follow up after the study either by recommending treatment first or via phone conference discussing which treatment the patient would prefer if several options are available.   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  On the days he does not drive he makes up with extra nap time, usually he will get about 6 hours of sleep and daytime.  Sleep can be fragmented by nocturia 2-3 times he has changed his fluid intake and has shifted away from TV.  He may drink 2-4 carbonated or caffeinated beverages a day.  Assessment - severe OSA and severe hypoxia.   1) I will order another at home ONO for him  on 02 and on CPAP- and he is 100% compliant with  CPAP -   I would like to thank [name], DO and [name], Do [address] [place],  [place] [address] for allowing me to meet with and to take care of this pleasant patient.     I plan to follow up on ONO either personally or through our NP within 60 s days, by phone - I will call the patient with results, RV with NP in 3-4 months,    CC: I will share my notes with PCP/ DOT.  Electronically signed by: Melvyn Novas, MD 12/13/2021 9:13 AM  [facility] Board certified by The [employer] of Sleep Medicine and Diplomate of the [employer] of Sleep Medicine. Board certified In Neurology through the ABPN, Fellow of the [employer] of Neurology. Medical Director of [facility].

## 2021-12-13 NOTE — Progress Notes (Signed)
CM sent to [facility] for new order ?

## 2021-12-15 ENCOUNTER — Encounter (INDEPENDENT_AMBULATORY_CARE_PROVIDER_SITE_OTHER): Payer: Self-pay

## 2021-12-16 ENCOUNTER — Other Ambulatory Visit: Payer: Self-pay | Admitting: Family Medicine

## 2021-12-16 DIAGNOSIS — M7741 Metatarsalgia, right foot: Secondary | ICD-10-CM

## 2022-01-24 ENCOUNTER — Telehealth: Payer: Self-pay | Admitting: Neurology

## 2022-01-24 NOTE — Telephone Encounter (Signed)
Pt said, question about next appt if Dr. Brett Fairy was suppose order breathing test before next appt. Would like a call from the nurse.

## 2022-01-24 NOTE — Telephone Encounter (Signed)
Called pt back. He has not been contacted to schedule ONO on CPAP and 2L O2. Order placed 12/13/21. He will call [facility] at [phone] to schedule. I also sent community message to [facility] letting them know order in and he should be reaching out.

## 2022-02-09 ENCOUNTER — Other Ambulatory Visit: Payer: Self-pay | Admitting: Family Medicine

## 2022-02-09 ENCOUNTER — Telehealth: Payer: Self-pay | Admitting: Neurology

## 2022-02-09 DIAGNOSIS — K219 Gastro-esophageal reflux disease without esophagitis: Secondary | ICD-10-CM

## 2022-02-09 NOTE — Telephone Encounter (Signed)
ONO on CPAP with 2 L oxygen bled into the machine was completed on 01/25/2022. He had 5 hours 25 min and 59 sec of recorded time and there was a total of 17 min and 43 sec where the oxygen was spent below 89%.  I will send the report to Dr Brett Fairy to review. The patient's oxygen seems to be ordered by PCP.

## 2022-02-10 NOTE — Telephone Encounter (Signed)
No additional need for oxygen above current level is seen.  The total time in hypoxia on 2 liters of 02 and on CPAP was 18 minutes and seen in the first half of the ONO, it changed after 6.30 PM to normal oxygenation.  The O2 nadir was likely an artefact at 80%.

## 2022-02-14 NOTE — Telephone Encounter (Signed)
LVM for pt to call office

## 2022-02-21 NOTE — Telephone Encounter (Signed)
Pt is returning a call from nurse. Pt said call him in the morning please.

## 2022-02-22 NOTE — Telephone Encounter (Signed)
Called pt back. Relayed results. He verbalized understanding. Cx November appt since he is stable, doing well. Compliant w/ CPAP. Rescheduled for him to have yearly f/u 12/25/21 at 9:30am with Dr. Brett Fairy.

## 2022-02-25 ENCOUNTER — Telehealth: Payer: Self-pay | Admitting: Family Medicine

## 2022-02-25 ENCOUNTER — Telehealth: Payer: Self-pay | Admitting: Neurology

## 2022-02-25 ENCOUNTER — Ambulatory Visit (INDEPENDENT_AMBULATORY_CARE_PROVIDER_SITE_OTHER): Payer: 59 | Admitting: *Deleted

## 2022-02-25 DIAGNOSIS — G4733 Obstructive sleep apnea (adult) (pediatric): Secondary | ICD-10-CM

## 2022-02-25 DIAGNOSIS — Z23 Encounter for immunization: Secondary | ICD-10-CM

## 2022-02-25 DIAGNOSIS — I5032 Chronic diastolic (congestive) heart failure: Secondary | ICD-10-CM

## 2022-02-25 NOTE — Telephone Encounter (Signed)
Pt aware ok to get- scheduled appt

## 2022-02-25 NOTE — Telephone Encounter (Signed)
Pt said Dr. Marjean Donna nurse told me Dr. Brett Fairy had in my chart I had congestive heart condition. Would like a call from the nurse to clarify, because was not aware.

## 2022-02-25 NOTE — Telephone Encounter (Signed)
Patient calling to see if he is due for a pneumonia shot. Would like to make appt to get it if he is. Please call back.

## 2022-02-25 NOTE — Telephone Encounter (Signed)
Not 61 yo but has h/o OSA/ CHF so ok to schedule.

## 2022-02-25 NOTE — Progress Notes (Signed)
Pt given Prevnar 20 injection IM right deltoid per Dr Darnell Level due to pt's hx of OSA and CHF and also received flu shot and tolerated well.

## 2022-02-28 ENCOUNTER — Encounter: Payer: Self-pay | Admitting: Internal Medicine

## 2022-02-28 ENCOUNTER — Ambulatory Visit (INDEPENDENT_AMBULATORY_CARE_PROVIDER_SITE_OTHER): Payer: 59 | Admitting: Internal Medicine

## 2022-02-28 VITALS — BP 114/64 | HR 60 | Temp 98.2°F | Ht 70.0 in | Wt 251.6 lb

## 2022-02-28 DIAGNOSIS — G4733 Obstructive sleep apnea (adult) (pediatric): Secondary | ICD-10-CM | POA: Diagnosis not present

## 2022-02-28 DIAGNOSIS — R0602 Shortness of breath: Secondary | ICD-10-CM

## 2022-02-28 DIAGNOSIS — Z7722 Contact with and (suspected) exposure to environmental tobacco smoke (acute) (chronic): Secondary | ICD-10-CM | POA: Diagnosis not present

## 2022-02-28 NOTE — Progress Notes (Signed)
Jacob Cline    [id number]    1960-05-22  Primary Care Physician:Gottschalk, Rozell Searing, DO Date of Appointment: 02/28/2022 Established Patient Visit  Chief complaint:   Chief Complaint  Patient presents with   Follow-up    No sx noted      HPI: Jacob Cline is a 61 y.o. man with history of OSA on CPAP with ongoing shortness of breath.  Interval Updates: Here for follow up after trial of breo inhaler for possible asthma. Never took breo due to reading package insert and didn't start due to side effects.   He is using albuterol just as needed - which is basically none.  His dyspnea overall has improved.   Following up on cpap with Dr. Golden Hurter. Doing well. Now on [facility] nocturnally.   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  Has been to weight loss clinic and trying to make better choices.   I have reviewed the patient's family social and past medical history and updated as appropriate.   Past Medical History:  Diagnosis Date   Aortic aneurysm (HCC)    Back pain    BPH (benign prostatic hypertrophy)    Cyst of left kidney    Edema of both lower extremities    GERD (gastroesophageal reflux disease)    Hemorrhoids    Hiatal hernia    Hyperlipidemia    Hypertension    Irregular heart beat    Joint pain    Obesity    Post-operative nausea and vomiting    Vitamin D deficiency     Past Surgical History:  Procedure Laterality Date   athroscopy of rt knee  02/02   henia - umbilical surgery  07/17/07   HERNIA REPAIR  05/15/2017   double hernia surgery   HIP SURGERY  11/2014   right hip   MOUTH SURGERY     VASECTOMY      Family History  Problem Relation Age of Onset   Diabetes Mother    Scleroderma Sister    Diabetes Brother    Diabetes Brother    Colon cancer Other        family history    Prostate cancer Other        family history    Lung disease Neg Hx     Social History   Occupational History    Occupation: Full time   Occupation: Naval architect  Tobacco Use   Smoking status: Never   Smokeless tobacco: Never  Building services engineer Use: Never used  Substance and Sexual Activity   Alcohol use: No    Alcohol/week: 0.0 standard drinks of alcohol   Drug use: No   Sexual activity: Not on file     Physical Exam: Blood pressure 114/64, pulse 60, temperature 98.2 F (36.8 C), temperature source Oral, height 5\' 10"  (1.778 m), weight 251 lb 9.6 oz (114.1 kg), SpO2 99 %.  Gen:      No acute distress Lungs:    ctab no wheezes or crackles, no increased wob CV:         RRR no mrg Abd: obese  Data Reviewed: Imaging: I have personally reviewed the CT Angio chest with small pulmonary nodule  PFTs:     Latest Ref Rng & Units 10/29/2021    1:35 PM  PFT Results  FVC-Pre L 3.02   FVC-Predicted Pre % 64   FVC-Post L 3.06  FVC-Predicted Post % 65   Pre FEV1/FVC % % 86   Post FEV1/FCV % % 87   FEV1-Pre L 2.61   FEV1-Predicted Pre % 74   FEV1-Post L 2.66   DLCO uncorrected ml/min/mmHg 22.71   DLCO UNC% % 83   DLCO corrected ml/min/mmHg 22.71   DLCO COR %Predicted % 83   DLVA Predicted % 120   TLC L 5.04   TLC % Predicted % 71   RV % Predicted % 72    I have personally reviewed the patient's PFTs and mild restriction to ventilation secondary to body habitus   Labs:  Immunization status: Immunization History  Administered Date(s) Administered   Influenza, Seasonal, Injecte, Preservative Fre 01/25/2021   Influenza,inj,Quad PF,6+ Mos 02/11/2019, 02/10/2020, 01/25/2021, 02/25/2022   Influenza-Unspecified 02/11/2019, 02/10/2020, 01/25/2021   Moderna Sars-Covid-2 Vaccination 08/12/2019, 09/09/2019   PNEUMOCOCCAL CONJUGATE-20 02/25/2022   Td 05/10/2003, 01/20/2017   Td (Adult), 2 Lf Tetanus Toxid, Preservative Free 05/10/2003, 01/20/2017   Zoster Recombinat (Shingrix) 01/12/2018, 08/13/2018    External Records Personally Reviewed: pulmonary, family medicine,  neurology  Assessment:  Shortness of breath, likely deconditioning related Passive smoke exposure OSA on CPAP and 2LNC Obesity Body mass index is 36.1 kg/m.   Plan/Recommendations: Keep up the good work with your dietary changes!  Try to be active with intentional walking/exercise when you can. Keep using CPAP.  He wants to follow up in a year with me - I am happy to see him back sooner if any issues Continue albuterol as needed.   Return to Care: Return in 1 year (on 03/01/2023), or if symptoms worsen or fail to improve.   Lenice Llamas, MD Pulmonary and [place]

## 2022-02-28 NOTE — Patient Instructions (Addendum)
Please schedule follow up scheduled with myself in 1year.  If my schedule is not open yet, we will contact you with a reminder closer to that time. Please call [phone] if you haven't heard from [place] a month before.    Keep up the good work with your dietary changes!  Try to be active with intentional walking/exercise when you can. Keep using CPAP.  Continue albuterol as needed.   Come back and see me sooner if any issues with your breathing that worsen.

## 2022-02-28 NOTE — Telephone Encounter (Signed)
Called pt. Advised it looks like Dr. Brett Fairy was using dx to r/o dx. Went on actual dx list by mistake it appears.  He follows w/ cardiologist and never told he has CHF. Aware we will submit IT ticket to have this removed. I submitted ticket.

## 2022-03-21 ENCOUNTER — Ambulatory Visit: Payer: 59 | Admitting: Adult Health

## 2022-04-03 ENCOUNTER — Other Ambulatory Visit: Payer: Self-pay | Admitting: Family Medicine

## 2022-04-03 DIAGNOSIS — M7741 Metatarsalgia, right foot: Secondary | ICD-10-CM

## 2022-05-04 ENCOUNTER — Encounter: Payer: Self-pay | Admitting: Neurology

## 2022-05-23 ENCOUNTER — Encounter: Payer: 59 | Admitting: Family Medicine

## 2022-05-28 ENCOUNTER — Other Ambulatory Visit: Payer: Self-pay | Admitting: Family Medicine

## 2022-05-28 DIAGNOSIS — K219 Gastro-esophageal reflux disease without esophagitis: Secondary | ICD-10-CM

## 2022-07-25 ENCOUNTER — Other Ambulatory Visit: Payer: Self-pay | Admitting: Family Medicine

## 2022-07-25 DIAGNOSIS — M7742 Metatarsalgia, left foot: Secondary | ICD-10-CM

## 2022-07-28 ENCOUNTER — Other Ambulatory Visit: Payer: Self-pay | Admitting: Family Medicine

## 2022-07-28 DIAGNOSIS — K449 Diaphragmatic hernia without obstruction or gangrene: Secondary | ICD-10-CM

## 2022-08-01 ENCOUNTER — Other Ambulatory Visit: Payer: Self-pay | Admitting: Family Medicine

## 2022-08-01 DIAGNOSIS — K219 Gastro-esophageal reflux disease without esophagitis: Secondary | ICD-10-CM

## 2022-08-23 ENCOUNTER — Other Ambulatory Visit: Payer: Self-pay | Admitting: Family Medicine

## 2022-08-23 DIAGNOSIS — M7741 Metatarsalgia, right foot: Secondary | ICD-10-CM

## 2022-08-28 ENCOUNTER — Other Ambulatory Visit: Payer: Self-pay | Admitting: Family Medicine

## 2022-08-28 DIAGNOSIS — K219 Gastro-esophageal reflux disease without esophagitis: Secondary | ICD-10-CM

## 2022-09-01 ENCOUNTER — Encounter: Payer: Self-pay | Admitting: Family

## 2022-09-01 ENCOUNTER — Ambulatory Visit (INDEPENDENT_AMBULATORY_CARE_PROVIDER_SITE_OTHER): Payer: 59 | Admitting: Family

## 2022-09-01 VITALS — BP 118/71 | HR 81 | Temp 97.0°F | Ht 70.0 in | Wt 246.0 lb

## 2022-09-01 DIAGNOSIS — R233 Spontaneous ecchymoses: Secondary | ICD-10-CM | POA: Diagnosis not present

## 2022-09-01 NOTE — Progress Notes (Signed)
Subjective:    Patient ID: Jacob Cline, male    DOB: August 23, 1960, 62 y.o.   MRN: [id number]  Chief Complaint  Patient presents with   Rash    LEFT LOWER LEG FRONT SINCE TUESDAY    Pt presents to the office today with rash on left lower leg that he noticed two days ago. Reports it is unchanged. Reports mild itching and has now improved with steroid cream.  Rash This is a new problem. The current episode started in the past 7 days. The problem is unchanged. The affected locations include the left lower leg. The rash is characterized by redness and itchiness. He was exposed to nothing. Pertinent negatives include no congestion, cough, diarrhea, facial edema, fatigue, fever, rhinorrhea, shortness of breath, sore throat or vomiting. Past treatments include anti-itch cream. The treatment provided mild relief.      Review of Systems  Constitutional:  Negative for fatigue and fever.  HENT:  Negative for congestion, rhinorrhea and sore throat.   Respiratory:  Negative for cough and shortness of breath.   Gastrointestinal:  Negative for diarrhea and vomiting.  Skin:  Positive for rash.  All other systems reviewed and are negative.      Objective:   Physical Exam Vitals reviewed.  Constitutional:      General: He is not in acute distress.    Appearance: He is well-developed.  HENT:     Head: Normocephalic.  Eyes:     General:        Right eye: No discharge.        Left eye: No discharge.     Pupils: Pupils are equal, round, and reactive to light.  Neck:     Thyroid: No thyromegaly.  Cardiovascular:     Rate and Rhythm: Normal rate and regular rhythm.     Heart sounds: Normal heart sounds. No murmur heard. Pulmonary:     Effort: Pulmonary effort is normal. No respiratory distress.     Breath sounds: Normal breath sounds. No wheezing.  Abdominal:     General: Bowel sounds are normal. There is no distension.     Palpations: Abdomen is soft.     Tenderness: There is no  abdominal tenderness.  Musculoskeletal:        General: No tenderness. Normal range of motion.     Cervical back: Normal range of motion and neck supple.  Skin:    General: Skin is warm and dry.     Findings: Petechiae present. No erythema or rash.          Comments: Petechiae rash on left lower leg and right wrist, nonblanchable   Neurological:     Mental Status: He is alert and oriented to person, place, and time.     Cranial Nerves: No cranial nerve deficit.     Deep Tendon Reflexes: Reflexes are normal and symmetric.  Psychiatric:        Behavior: Behavior normal.        Thought Content: Thought content normal.        Judgment: Judgment normal.     BP 118/71   Pulse 81   Temp (!) 97 F (36.1 C) (Temporal)   Ht  (1.778 m)   Wt 246 lb (111.6 kg)   SpO2 96%   BMI 35.30 kg/m        Assessment & Plan:  [name] comes in today with chief complaint of Rash (LEFT LOWER LEG FRONT SINCE TUESDAY )  Diagnosis and orders addressed:  1. Petechiae Pt is on daily aspirin Reports his leg was itching and then noticed rash, I believe he caused some of this from scratching.  Avoid scratching CBC pending  Continue aspirin at this time  - CBC with Differential/Platelet   Jannifer Rodney, FNP

## 2022-09-01 NOTE — Patient Instructions (Signed)
Rash, Adult A rash is a change in the color of your skin. A rash can also change the way your skin feels. There are many different conditions and factors that can cause a rash. Some rashes may disappear after a few days, but some may last for a few weeks. Common causes of rashes include: Viral infections, such as: Colds. Measles. Hand, foot, and mouth disease. Bacterial infections, such as: Scarlet fever. Impetigo. Fungal infections, such as Candida. Allergic reactions to food, medicines, or skin care products. Follow these instructions at home: The goal of treatment is to stop the itching and keep the rash from spreading. Pay attention to any changes in your symptoms. Follow these instructions to help with your condition: Medicine Take or apply over-the-counter and prescription medicines only as told by your health care provider. These may include: Corticosteroid creams to treat red or swollen skin. Anti-itch lotions. Oral allergy medicines (antihistamines). Oral corticosteroids for severe symptoms.  Skin care Apply cool compresses to the affected areas. Do not scratch or rub your skin. Avoid covering the rash. Make sure the rash is exposed to air as much as possible. Managing itching and discomfort Avoid hot showers or baths, which can make itching worse. A cold shower may help. Try taking a bath with: Epsom salts. Follow manufacturer instructions on the packaging. You can get these at your local pharmacy or grocery store. Baking soda. Pour a small amount into the bath as told by your health care provider. Colloidal oatmeal. Follow manufacturer instructions on the packaging. You can get this at your local pharmacy or grocery store. Try applying baking soda paste to your skin. Stir water into baking soda until it reaches a paste-like consistency. Try applying calamine lotion. This is an over-the-counter lotion that helps to relieve itchiness. Keep cool and out of the sun. Sweating  and being hot can make itching worse. General instructions  Rest as needed. Drink enough fluid to keep your urine pale yellow. Wear loose-fitting clothing. Avoid scented soaps, detergents, and perfumes. Use gentle soaps, detergents, perfumes, and other cosmetic products. Avoid any substance that causes your rash. Keep a journal to help track what causes your rash. Write down: What you eat. What cosmetic products you use. What you drink. What you wear. This includes jewelry. Keep all follow-up visits as told by your health care provider. This is important. Contact a health care provider if: You sweat at night. You lose weight. You urinate more than normal. You urinate less than normal, or you notice that your urine is a darker color than usual. You feel weak. You vomit. Your skin or the whites of your eyes look yellow (jaundice). Your skin: Tingles. Is numb. Your rash: Does not go away after several days. Gets worse. You are: Unusually thirsty. More tired than normal. You have: New symptoms. Pain in your abdomen. A fever. Diarrhea. Get help right away if you: Have a fever and your symptoms suddenly get worse. Develop confusion. Have a severe headache or a stiff neck. Have severe joint pains or stiffness. Have a seizure. Develop a rash that covers all or most of your body. The rash may or may not be painful. Develop blisters that: Are on top of the rash. Grow larger or grow together. Are painful. Are inside your nose or mouth. Develop a rash that: Looks like purple pinprick-sized spots all over your body. Has a "bull's eye" or looks like a target. Is not related to sun exposure, is red and painful, and causes   your skin to peel. Summary A rash is a change in the color of your skin. Some rashes disappear after a few days, but some may last for a few weeks. The goal of treatment is to stop the itching and keep the rash from spreading. Take or apply over-the-counter  and prescription medicines only as told by your health care provider. Contact a health care provider if you have new or worsening symptoms. Keep all follow-up visits as told by your health care provider. This is important. This information is not intended to replace advice given to you by your health care provider. Make sure you discuss any questions you have with your health care provider. Document Revised: 10/26/2021 Document Reviewed: 02/04/2021 Elsevier Patient Education  2023 [employer]  

## 2022-09-02 LAB — CBC WITH DIFFERENTIAL/PLATELET
Basophils Absolute: 0 10*3/uL (ref 0.0–0.2)
Basos: 0 %
EOS (ABSOLUTE): 0.1 10*3/uL (ref 0.0–0.4)
Eos: 1 %
Hematocrit: 40.3 % (ref 37.5–51.0)
Hemoglobin: 13.6 g/dL (ref 13.0–17.7)
Immature Grans (Abs): 0 10*3/uL (ref 0.0–0.1)
Immature Granulocytes: 0 %
Lymphocytes Absolute: 1.9 10*3/uL (ref 0.7–3.1)
Lymphs: 14 %
MCH: 30.7 pg (ref 26.6–33.0)
MCHC: 33.7 g/dL (ref 31.5–35.7)
MCV: 91 fL (ref 79–97)
Monocytes Absolute: 1.1 10*3/uL — ABNORMAL HIGH (ref 0.1–0.9)
Monocytes: 8 %
Neutrophils Absolute: 10.6 10*3/uL — ABNORMAL HIGH (ref 1.4–7.0)
Neutrophils: 77 %
Platelets: 217 10*3/uL (ref 150–450)
RBC: 4.43 x10E6/uL (ref 4.14–5.80)
RDW: 13.7 % (ref 11.6–15.4)
WBC: 13.7 10*3/uL — ABNORMAL HIGH (ref 3.4–10.8)

## 2022-09-20 ENCOUNTER — Other Ambulatory Visit: Payer: Self-pay | Admitting: Family Medicine

## 2022-09-20 DIAGNOSIS — M7741 Metatarsalgia, right foot: Secondary | ICD-10-CM

## 2022-09-21 IMAGING — MR MR ABDOMEN WO/W CM
20 series · 48 of 48 positions shown · IV contrast (gadavist)
Comparison: 07/27/2020

CLINICAL DATA: Renal mass

EXAM:
MRI ABDOMEN WITHOUT AND WITH CONTRAST
TECHNIQUE: Multiplanar multisequence MR imaging of the abdomen was performed
both before and after the administration of intravenous contrast.
CONTRAST:  10mL GADAVIST GADOBUTROL 1 MMOL/ML IV SOLN

[Series 4: cor haste · coronal · 6.0mm · 1.41mm/px · 2 of 30 slices shown]
[im 1/30]
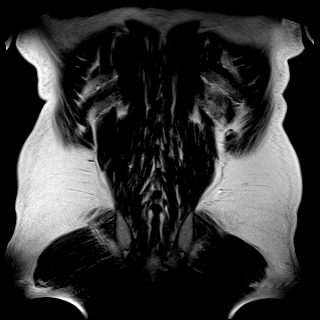
[im 30/30]
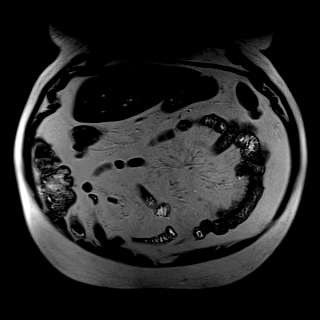

[Series 5: ax haste · axial · 6.0mm · 1.41mm/px · z∈[-221,-13]mm · 2 of 30 slices shown]
[im 1/30]
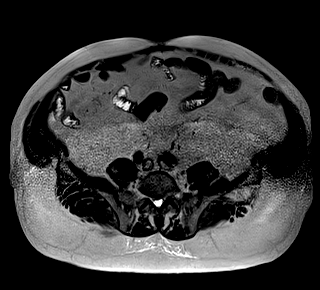
[im 30/30]
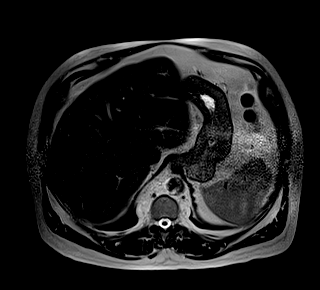

[Series 6: T2 fat-sat · axial · 6.0mm · 1.41mm/px · z∈[-245,-37]mm · 2 of 30 slices shown]
[im 1/30]
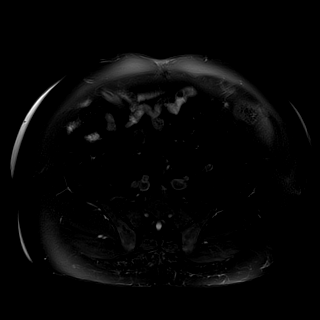
[im 30/30]
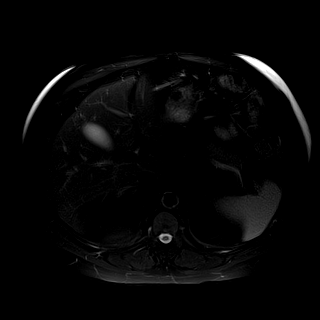

[Series 7: ax in and · axial · 3.5mm · 1.41mm/px · z∈[-265,-17]mm · 4 of 72 slices shown (1 of 2)]
[im 1/72]
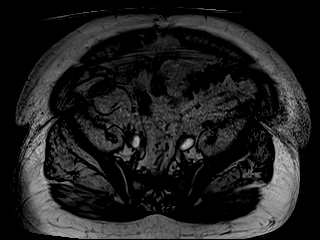
[im 24/72]
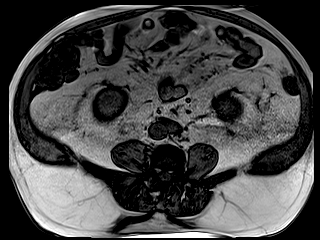
[im 48/72]
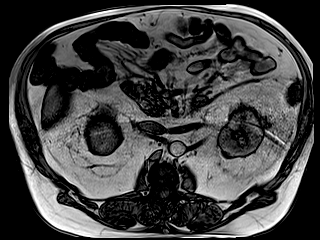
[im 72/72]
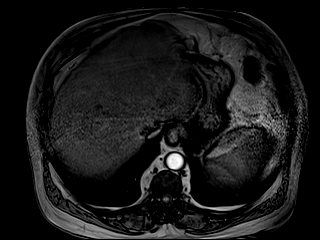

[Series 8: ax in and · axial · 3.5mm · 1.41mm/px · z∈[-265,-17]mm · 3 of 72 slices shown (2 of 2)]
[im 1/72]
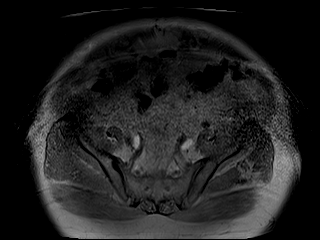
[im 36/72]
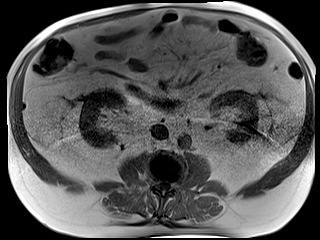
[im 72/72]
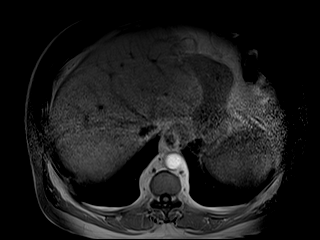

[Series 9: DWI · axial · 6.0mm · 1.68mm/px · 1 of 30 slices shown (1 of 4)]
[im 1/30]
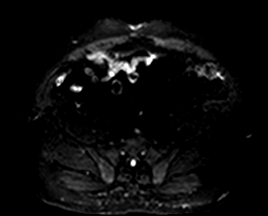

[Series 9: DWI · axial · 6.0mm · 1.68mm/px · 1 of 30 slices shown (2 of 4)]
[im 1/30]
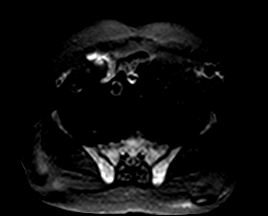

[Series 9: DWI · axial · 6.0mm · 1.68mm/px · 1 of 30 slices shown (3 of 4)]
[im 1/30]
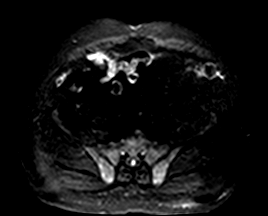

[Series 10: DWI · axial · 6.0mm · 1.68mm/px · 1 of 30 slices shown (4 of 4)]
[im 1/30]
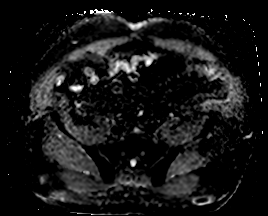

[Series 11: bSSFP · axial · 6.0mm · 0.88mm/px · 1 of 30 slices shown]
[im 1/30]
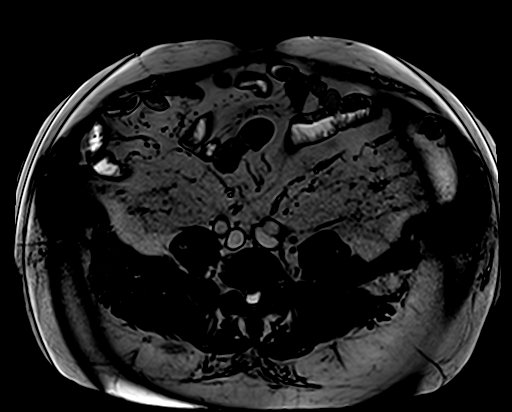

[Series 12: t1_vibe_fs_tra_p4_bh_pre · axial · 3.0mm · 1.41mm/px · z∈[-244,-31]mm · 3 of 72 slices shown]
[im 1/72]
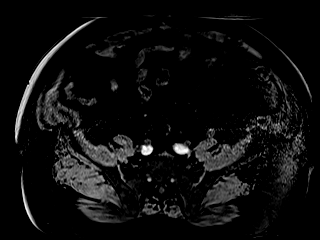
[im 36/72]
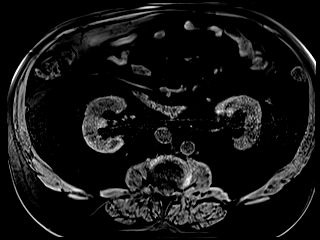
[im 72/72]
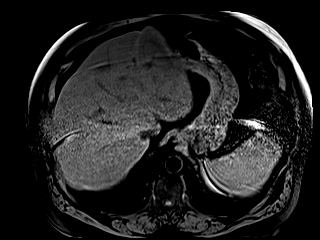

[Series 14: t1_vibe_fs_tra_p4_bh_post · axial · 3.0mm · 1.41mm/px · z∈[-244,-31]mm · 3 of 72 slices shown (1 of 4)]
[im 1/72]
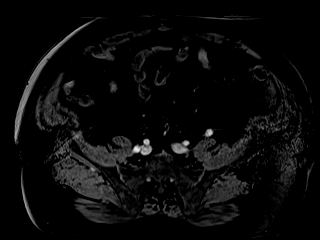
[im 36/72]
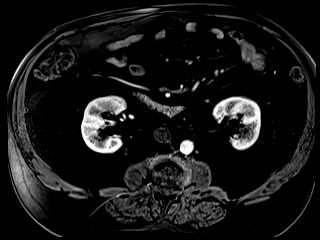
[im 72/72]
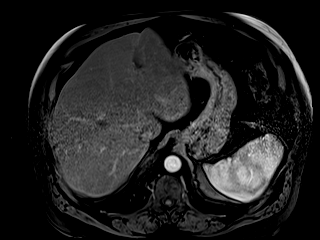

[Series 15: t1_vibe_fs_tra_p4_bh_post_sub · axial · 3.0mm · 1.41mm/px · z∈[-244,-31]mm · 3 of 72 slices shown (1 of 4)]
[im 1/72]
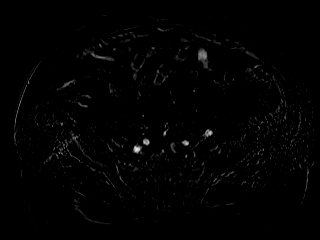
[im 36/72]
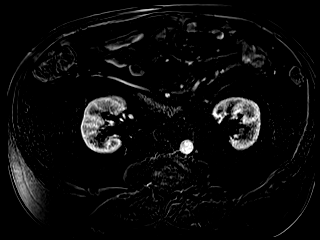
[im 72/72]
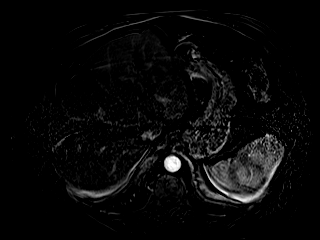

[Series 16: t1_vibe_fs_tra_p4_bh_post · axial · 3.0mm · 1.41mm/px · z∈[-244,-31]mm · 3 of 72 slices shown (2 of 4)]
[im 1/72]
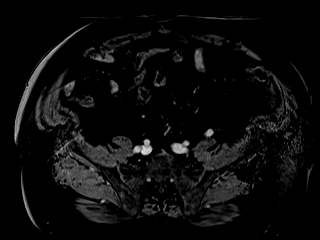
[im 36/72]
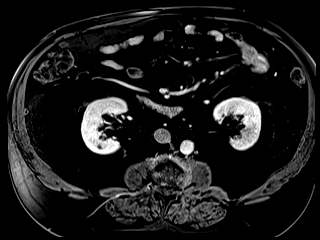
[im 72/72]
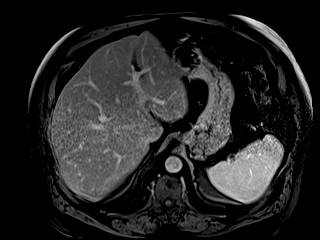

[Series 17: t1_vibe_fs_tra_p4_bh_post_sub · axial · 3.0mm · 1.41mm/px · z∈[-244,-31]mm · 3 of 72 slices shown (2 of 4)]
[im 1/72]
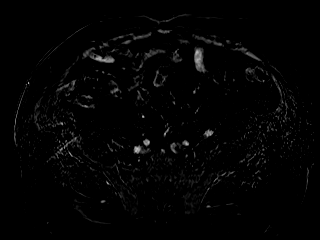
[im 36/72]
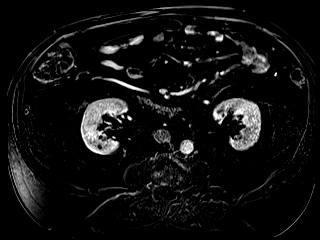
[im 72/72]
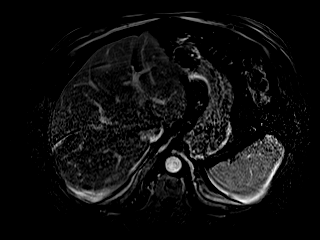

[Series 18: t1_vibe_fs_tra_p4_bh_post · axial · 3.0mm · 1.41mm/px · z∈[-244,-31]mm · 3 of 72 slices shown (3 of 4)]
[im 1/72]
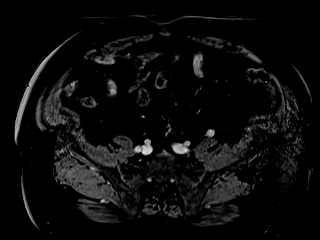
[im 36/72]
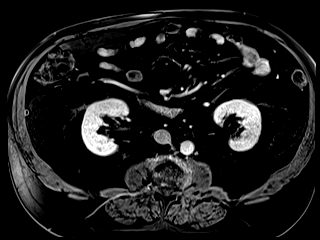
[im 72/72]
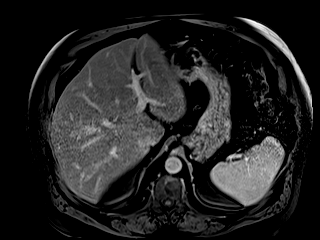

[Series 19: t1_vibe_fs_tra_p4_bh_post_sub · axial · 3.0mm · 1.41mm/px · z∈[-244,-31]mm · 3 of 72 slices shown (3 of 4)]
[im 1/72]
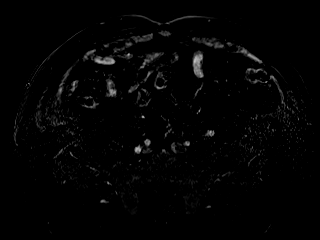
[im 36/72]
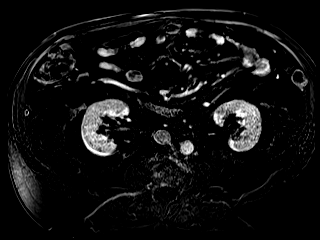
[im 72/72]
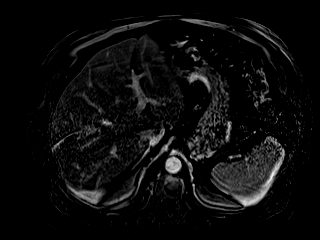

[Series 20: t1_vibe_fs_tra_p4_bh_post · axial · 3.0mm · 1.41mm/px · z∈[-244,-31]mm · 3 of 72 slices shown (4 of 4)]
[im 1/72]
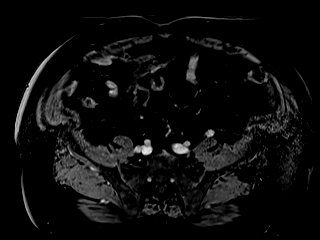
[im 36/72]
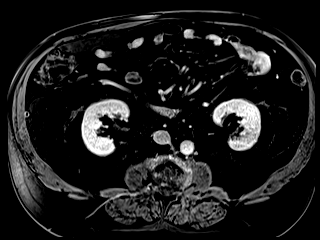
[im 72/72]
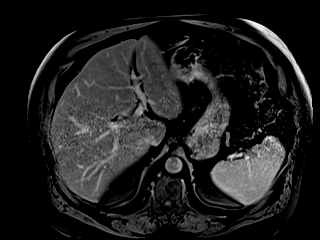

[Series 21: t1_vibe_fs_tra_p4_bh_post_sub · axial · 3.0mm · 1.41mm/px · z∈[-244,-31]mm · 3 of 72 slices shown (4 of 4)]
[im 1/72]
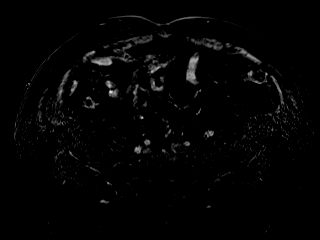
[im 36/72]
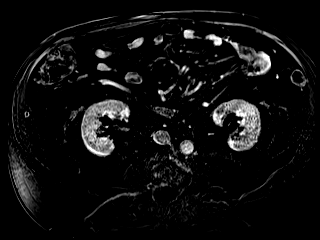
[im 72/72]
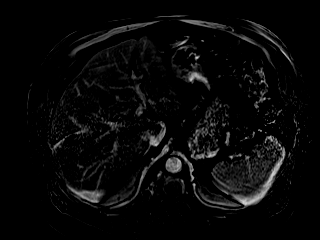

[Series 22: T1 dynamic post-contrast · coronal · 3.0mm · 1.41mm/px · 3 of 80 slices shown]
[im 1/80]
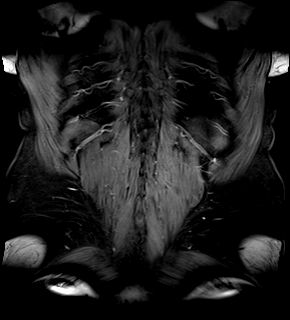
[im 40/80]
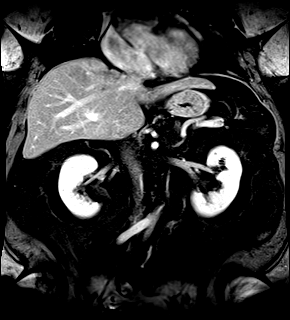
[im 80/80]
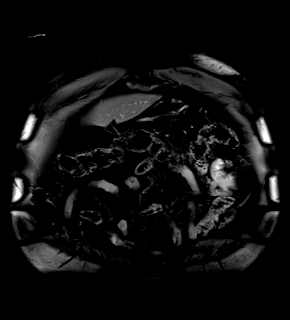

[48 of 48 positions shown; findings below may reference images not displayed]

FINDINGS: Lower chest: No acute findings.

Hepatobiliary: Hepatic steatosis. No mass or other parenchymal
abnormality identified. No gallstones. No biliary ductal dilatation.

Pancreas: No mass, inflammatory changes, or other parenchymal
abnormality identified.No pancreatic ductal dilatation.

Spleen:  Within normal limits in size and appearance.

Adrenals/Urinary Tract: Normal adrenal glands. Unchanged, T1 and T2
intermediate, nonenhancing hemorrhagic or proteinaceous cystic
lesion of the medial superior pole of the left kidney measuring
x 2.2 cm (series 17, image 21). Additional small bilateral fluid
signal simple benign renal cysts are likewise unchanged, no further
follow-up required for these lesions. No suspicious renal masses or
suspicious contrast enhancement identified. No evidence of
hydronephrosis.

Stomach/Bowel: Visualized portions within the abdomen are
unremarkable.

Vascular/Lymphatic: No pathologically enlarged lymph nodes
identified. No abdominal aortic aneurysm demonstrated.

Other:  None.

Musculoskeletal: No suspicious osseous lesions identified.
IMPRESSION: 1. Stable hemorrhagic or proteinaceous cystic lesion of the medial
superior pole of the left kidney without associated contrast
enhancement measuring 2.5 x 2.2 cm. Given lack of suspicious
features on current examination and well established stability, this
is consistent with a benign Bosniak category II lesion for which no
further follow-up or characterization is required.
2. Hepatic steatosis.

## 2022-09-26 ENCOUNTER — Ambulatory Visit (INDEPENDENT_AMBULATORY_CARE_PROVIDER_SITE_OTHER): Payer: 59 | Admitting: Family Medicine

## 2022-09-26 ENCOUNTER — Encounter: Payer: Self-pay | Admitting: Family Medicine

## 2022-09-26 VITALS — BP 109/68 | HR 64 | Temp 98.5°F | Ht 70.0 in | Wt 243.0 lb

## 2022-09-26 DIAGNOSIS — I2510 Atherosclerotic heart disease of native coronary artery without angina pectoris: Secondary | ICD-10-CM

## 2022-09-26 DIAGNOSIS — N400 Enlarged prostate without lower urinary tract symptoms: Secondary | ICD-10-CM

## 2022-09-26 DIAGNOSIS — G4733 Obstructive sleep apnea (adult) (pediatric): Secondary | ICD-10-CM

## 2022-09-26 DIAGNOSIS — Z0000 Encounter for general adult medical examination without abnormal findings: Secondary | ICD-10-CM

## 2022-09-26 DIAGNOSIS — R7303 Prediabetes: Secondary | ICD-10-CM

## 2022-09-26 DIAGNOSIS — Z0001 Encounter for general adult medical examination with abnormal findings: Secondary | ICD-10-CM | POA: Diagnosis not present

## 2022-09-26 DIAGNOSIS — I700 Atherosclerosis of aorta: Secondary | ICD-10-CM

## 2022-09-26 DIAGNOSIS — I5032 Chronic diastolic (congestive) heart failure: Secondary | ICD-10-CM

## 2022-09-26 DIAGNOSIS — I10 Essential (primary) hypertension: Secondary | ICD-10-CM

## 2022-09-26 DIAGNOSIS — E559 Vitamin D deficiency, unspecified: Secondary | ICD-10-CM

## 2022-09-26 DIAGNOSIS — E7800 Pure hypercholesterolemia, unspecified: Secondary | ICD-10-CM

## 2022-09-26 LAB — BAYER DCA HB A1C WAIVED: HB A1C (BAYER DCA - WAIVED): 6.2 % — ABNORMAL HIGH (ref 4.8–5.6)

## 2022-09-26 NOTE — Patient Instructions (Signed)

## 2022-09-26 NOTE — Progress Notes (Signed)
Jacob Cline is a 62 y.o. male presents to office today for annual physical exam examination.    Concerns today include: 1.  Renal cyst He just wants to ensure that he does not need ongoing follow-up of his renal cyst.  He had imaging last year and wanted to review that today.  He did have a corticosteroid injection to his left hip recently.  Plans for left hip surgery after he gets back from his trip.  He reports that he is doing very well and he is going on his very first trip to [place] with his wife.  He has family who is stationed in [employer] there and he is really looking forward to it.  Diet: Typical American, Exercise: No structured Last eye exam: Up-to-date Last dental exam: Up-to-date Last colonoscopy: Up-to-date Refills needed today: None Immunizations needed: Immunization History  Administered Date(s) Administered   Influenza, Seasonal, Injecte, Preservative Fre 01/25/2021   Influenza,inj,Quad PF,6+ Mos 02/11/2019, 02/10/2020, 01/25/2021, 02/25/2022   Influenza-Unspecified 02/11/2019, 02/10/2020, 01/25/2021   Moderna Sars-Covid-2 Vaccination 08/12/2019, 09/09/2019   PNEUMOCOCCAL CONJUGATE-20 02/25/2022   Td 05/10/2003, 01/20/2017   Td (Adult), 2 Lf Tetanus Toxid, Preservative Free 05/10/2003, 01/20/2017   Zoster Recombinat (Shingrix) 01/12/2018, 08/13/2018     Past Medical History:  Diagnosis Date   Aortic aneurysm (HCC)    Back pain    BPH (benign prostatic hypertrophy)    Cyst of left kidney    Edema of both lower extremities    GERD (gastroesophageal reflux disease)    Hemorrhoids    Hiatal hernia    Hyperlipidemia    Hypertension    Irregular heart beat    Joint pain    Obesity    Post-operative nausea and vomiting    Vitamin D deficiency    Social History   Socioeconomic History   Marital status: Married    Spouse name: Not on file   Number of children: Not on file   Years of education: Not on file   Highest education level: Not on file   Occupational History   Occupation: Full time   Occupation: Naval architect  Tobacco Use   Smoking status: Never   Smokeless tobacco: Never  Vaping Use   Vaping Use: Never used  Substance and Sexual Activity   Alcohol use: No    Alcohol/week: 0.0 standard drinks of alcohol   Drug use: No   Sexual activity: Not on file  Other Topics Concern   Not on file  Social History Narrative   Not on file   Social Determinants of Health   Financial Resource Strain: Not on file  Food Insecurity: Not on file  Transportation Needs: Not on file  Physical Activity: Not on file  Stress: Not on file  Social Connections: Not on file  Intimate Partner Violence: Not on file   Past Surgical History:  Procedure Laterality Date   athroscopy of rt knee  02/02   henia - umbilical surgery  07/17/07   HERNIA REPAIR  05/15/2017   double hernia surgery   HIP SURGERY  11/2014   right hip   MOUTH SURGERY     VASECTOMY     Family History  Problem Relation Age of Onset   Diabetes Mother    Scleroderma Sister    Diabetes Brother    Diabetes Brother    Colon cancer Other        family history    Prostate cancer Other  family history    Lung disease Neg Hx     Current Outpatient Medications:    acetaminophen (TYLENOL) 500 MG tablet, Take by mouth., Disp: , Rfl:    albuterol (VENTOLIN HFA) 108 (90 Base) MCG/ACT inhaler, Inhale 2 puffs into the lungs every 6 (six) hours as needed. (Patient not taking: Reported on 09/01/2022), Disp: 18 g, Rfl: 5   aspirin 81 MG chewable tablet, Chew 81 mg by mouth daily., Disp: , Rfl:    calcium-vitamin D (OSCAL WITH D) 500-5 MG-MCG tablet, Take 1 tablet by mouth., Disp: , Rfl:    Cinnamon 500 MG capsule, Take 1,000 mg by mouth daily. , Disp: , Rfl:    diclofenac (VOLTAREN) 75 MG EC tablet, Take 1 tablet by mouth twice daily as needed, Disp: 60 tablet, Rfl: 0   magnesium oxide (MAG-OX) 400 MG tablet, Take 400 mg by mouth daily., Disp: , Rfl:    Multiple Vitamin  (MULTIVITAMIN) tablet, Take 1 tablet by mouth daily.  (Patient not taking: Reported on 09/01/2022), Disp: , Rfl:    Omega-3 Fatty Acids (FISH OIL) 1000 MG CAPS, Take 1 capsule by mouth daily. , Disp: , Rfl:    omeprazole (PRILOSEC) 20 MG capsule, TAKE 1 CAPSULE BY MOUTH TWICE DAILY BEFORE MEAL(S) ., Disp: 60 capsule, Rfl: 0   PARoxetine (PAXIL-CR) 25 MG 24 hr tablet, Take 1 tablet (25 mg total) by mouth daily., Disp: 90 tablet, Rfl: 3   Potassium 99 MG TABS, Take 1 tablet by mouth daily., Disp: , Rfl:    pravastatin (PRAVACHOL) 40 MG tablet, Take 1 tablet (40 mg total) by mouth at bedtime., Disp: 90 tablet, Rfl: 3   telmisartan (MICARDIS) 40 MG tablet, Take 1 tablet (40 mg total) by mouth daily., Disp: 90 tablet, Rfl: 3   triamterene-hydrochlorothiazide (MAXZIDE-25) 37.5-25 MG tablet, Take 1 tablet by mouth daily., Disp: 90 tablet, Rfl: 3   TURMERIC PO, Take 1 capsule by mouth daily. , Disp: , Rfl:    Vitamin D, Ergocalciferol, (DRISDOL) 1.25 MG (50000 UNIT) CAPS capsule, Take 1 capsule (50,000 Units total) by mouth every 7 (seven) days., Disp: 4 capsule, Rfl: 0  Allergies  Allergen Reactions   Other Anaphylaxis    Make me hurt alot   Codeine     Unknown    Lovastatin     Makes me hurt alot   Niaspan [Niacin Er]     Make me hurt alot     ROS: Review of Systems Pertinent items noted in HPI and remainder of comprehensive ROS otherwise negative.    Physical exam BP 109/68   Pulse 64   Temp 98.5 F (36.9 C)   Ht 5\' 10"  (1.778 m)   Wt 243 lb (110.2 kg)   SpO2 96%   BMI 34.87 kg/m  General appearance: alert, cooperative, appears stated age, and no distress Head: Normocephalic, without obvious abnormality, atraumatic Eyes: conjunctivae/corneas clear. PERRL, EOM's intact. Fundi benign. Ears: normal TM's and external ear canals both ears Nose: Nares normal. Septum midline. Mucosa normal. No drainage or sinus tenderness. Throat: lips, mucosa, and tongue normal; teeth and gums  normal Neck: no adenopathy, no carotid bruit, supple, symmetrical, trachea midline, and thyroid not enlarged, symmetric, no tenderness/mass/nodules Back: symmetric, no curvature. ROM normal. No CVA tenderness. Lungs: clear to auscultation bilaterally Chest wall: no tenderness Heart: regular rate and rhythm, S1, S2 normal, no murmur, click, rub or gallop Abdomen:  Protuberant, soft, nontender.  Organomegaly not palpable Extremities: extremities normal, atraumatic, no cyanosis or  edema Pulses: 2+ and symmetric Skin: Skin color, texture, turgor normal. No rashes or lesions Lymph nodes: Cervical, supraclavicular, and axillary nodes normal. Neurologic: Grossly normal Psych: Mood stable, speech normal, affect appropriate.  Very pleasant, interactive MSK: Gait is slightly antalgic  Assessment/ Plan: [name] here for annual physical exam.   Annual physical exam  Chronic diastolic heart failure (HCC) - Plan: CMP14+EGFR  Morbid obesity (HCC) - Plan: CMP14+EGFR, Lipid Panel, TSH, Bayer DCA Hb A1c Waived  Severe obstructive sleep apnea - Plan: CBC  Essential hypertension - Plan: CMP14+EGFR  Coronary artery disease involving native coronary artery of native heart without angina pectoris  Aortic atherosclerosis (HCC)  Pure hypercholesterolemia - Plan: CMP14+EGFR, Lipid Panel, TSH  Pre-diabetes - Plan: Bayer DCA Hb A1c Waived  Vitamin D deficiency - Plan: VITAMIN D 25 Hydroxy (Vit-D Deficiency, Fractures)  Benign prostatic hyperplasia, unspecified whether lower urinary tract symptoms present - Plan: PSA  Check fasting labs.  Reiterated lifestyle modification to reduce obesity.  I wonder if he might be a good candidate for Ozempic.  His last CT angiogram from June 2023 demonstrated coronary artery disease as well as aortic atherosclerosis.  Hopefully the Ozempic will have an indication soon for these patients at which point I will revisit discussing this with him as he is a known  prediabetic with morbid obesity, OSA, hyperlipidemia and chronic diastolic heart failure.  I discussed with him no further follow-up on renal cyst needed as it appeared to be stable and benign on last imaging study.  A copy of this imaging study was provided to the patient today  Counseled on healthy lifestyle choices, including diet (rich in fruits, vegetables and lean meats and low in salt and simple carbohydrates) and exercise (at least 30 minutes of moderate physical activity daily).  Patient to follow up in 1 year for annual exam or sooner if needed.  Vaida Kerchner M. Nadine Counts, DO

## 2022-09-27 ENCOUNTER — Other Ambulatory Visit: Payer: Self-pay | Admitting: Family Medicine

## 2022-09-27 DIAGNOSIS — D72829 Elevated white blood cell count, unspecified: Secondary | ICD-10-CM

## 2022-09-27 DIAGNOSIS — R7989 Other specified abnormal findings of blood chemistry: Secondary | ICD-10-CM

## 2022-09-27 LAB — CMP14+EGFR
ALT: 57 IU/L — ABNORMAL HIGH (ref 0–44)
AST: 30 IU/L (ref 0–40)
Albumin/Globulin Ratio: 1.9 (ref 1.2–2.2)
Albumin: 4.3 g/dL (ref 3.9–4.9)
Alkaline Phosphatase: 115 IU/L (ref 44–121)
BUN/Creatinine Ratio: 43 — ABNORMAL HIGH (ref 10–24)
BUN: 32 mg/dL — ABNORMAL HIGH (ref 8–27)
Bilirubin Total: 0.4 mg/dL (ref 0.0–1.2)
CO2: 24 mmol/L (ref 20–29)
Calcium: 9.3 mg/dL (ref 8.6–10.2)
Chloride: 98 mmol/L (ref 96–106)
Creatinine, Ser: 0.75 mg/dL — ABNORMAL LOW (ref 0.76–1.27)
Globulin, Total: 2.3 g/dL (ref 1.5–4.5)
Glucose: 112 mg/dL — ABNORMAL HIGH (ref 70–99)
Potassium: 4.5 mmol/L (ref 3.5–5.2)
Sodium: 136 mmol/L (ref 134–144)
Total Protein: 6.6 g/dL (ref 6.0–8.5)
eGFR: 101 mL/min/{1.73_m2} (ref >59)

## 2022-09-27 LAB — LIPID PANEL
Chol/HDL Ratio: 2.6 ratio (ref 0.0–5.0)
Cholesterol, Total: 130 mg/dL (ref 100–199)
HDL: 50 mg/dL (ref >39)
LDL Chol Calc (NIH): 62 mg/dL (ref 0–99)
Triglycerides: 98 mg/dL (ref 0–149)
VLDL Cholesterol Cal: 18 mg/dL (ref 5–40)

## 2022-09-27 LAB — CBC
Hematocrit: 40.4 % (ref 37.5–51.0)
Hemoglobin: 13.6 g/dL (ref 13.0–17.7)
MCH: 30.8 pg (ref 26.6–33.0)
MCHC: 33.7 g/dL (ref 31.5–35.7)
MCV: 92 fL (ref 79–97)
Platelets: 220 10*3/uL (ref 150–450)
RBC: 4.41 x10E6/uL (ref 4.14–5.80)
RDW: 13.2 % (ref 11.6–15.4)
WBC: 11.1 10*3/uL — ABNORMAL HIGH (ref 3.4–10.8)

## 2022-09-27 LAB — VITAMIN D 25 HYDROXY (VIT D DEFICIENCY, FRACTURES): Vit D, 25-Hydroxy: 32.5 ng/mL (ref 30.0–100.0)

## 2022-09-27 LAB — PSA: Prostate Specific Ag, Serum: 0.4 ng/mL (ref 0.0–4.0)

## 2022-09-27 LAB — TSH: TSH: 1.45 u[IU]/mL (ref 0.450–4.500)

## 2022-09-29 ENCOUNTER — Other Ambulatory Visit: Payer: Self-pay | Admitting: Family Medicine

## 2022-09-29 DIAGNOSIS — K449 Diaphragmatic hernia without obstruction or gangrene: Secondary | ICD-10-CM

## 2022-10-17 ENCOUNTER — Ambulatory Visit (INDEPENDENT_AMBULATORY_CARE_PROVIDER_SITE_OTHER): Payer: 59 | Admitting: Family Medicine

## 2022-10-17 ENCOUNTER — Encounter: Payer: Self-pay | Admitting: Family Medicine

## 2022-10-17 VITALS — BP 106/62 | HR 81 | Ht 70.0 in | Wt 245.0 lb

## 2022-10-17 DIAGNOSIS — M1612 Unilateral primary osteoarthritis, left hip: Secondary | ICD-10-CM | POA: Diagnosis not present

## 2022-10-17 NOTE — Progress Notes (Signed)
BP 106/62   Pulse 81   Ht 5\' 10"  (1.778 m)   Wt 245 lb (111.1 kg)   SpO2 96%   BMI 35.15 kg/m    Subjective:   Patient ID: Jacob Cline, male    DOB: March 17, 1961, 62 y.o.   MRN: [id number]  HPI: Jacob Cline is a 62 y.o. male presenting on 10/17/2022 for Hip Pain (left)   HPI Left hip pain Patient is coming in with left hip pain.  He has been having osteoarthritis in the left hip and has had injections there and this last injection just did not do as well for him.  The injection was a couple months ago and he was hoping to get a few months out of it but he is just not gotten that out of it.  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  He does have surgery scheduled for August 16 but he feels like he cannot go back to work until after the surgery.  Relevant past medical, surgical, family and social history reviewed and updated as indicated. Interim medical history since our last visit reviewed. Allergies and medications reviewed and updated.  Review of Systems  Constitutional:  Negative for chills and fever.  Respiratory:  Negative for shortness of breath and wheezing.   Cardiovascular:  Negative for chest pain and leg swelling.  Musculoskeletal:  Positive for arthralgias, gait problem and myalgias. Negative for back pain.  Skin:  Negative for rash.  All other systems reviewed and are negative.   Per HPI unless specifically indicated above   Allergies as of 10/17/2022       Reactions   Other Anaphylaxis   Make me hurt alot   Codeine    Unknown    Lovastatin    Makes me hurt alot   Niaspan [niacin Er]    Make me hurt alot        Medication List        Accurate as of October 17, 2022  9:00 AM. If you have any questions, ask your nurse or doctor.           acetaminophen 500 MG tablet Commonly known as: TYLENOL Take by mouth.   albuterol 108 (90 Base) MCG/ACT inhaler Commonly known as: VENTOLIN HFA Inhale 2 puffs into the lungs every 6 (six) hours as needed.   aspirin 81 MG chewable tablet Chew 81 mg by mouth daily.   calcium-vitamin D 500-5 MG-MCG tablet Commonly known as: OSCAL WITH D Take 1 tablet by mouth.   celecoxib 100 MG capsule Commonly known as: CELEBREX Take by mouth.   Cinnamon 500 MG capsule Take 1,000 mg by mouth daily.   diclofenac 75 MG EC tablet Commonly known as: VOLTAREN Take 1 tablet by mouth twice daily as needed   Fish Oil 1000 MG Caps Take 1 capsule by mouth daily.   magnesium oxide 400 MG tablet Commonly known as: MAG-OX Take 400 mg by mouth daily.   multivitamin tablet Take 1 tablet by mouth daily.   omeprazole 20 MG capsule Commonly known as: PRILOSEC TAKE 1 CAPSULE BY MOUTH TWICE DAILY BEFORE MEAL(S)   PARoxetine 25 MG 24 hr tablet Commonly known as: PAXIL-CR Take 1 tablet (  25 mg total) by mouth daily.   Potassium 99 MG Tabs Take 1 tablet by mouth daily.   pravastatin 40 MG tablet Commonly known as: PRAVACHOL Take 1 tablet (40 mg total) by mouth at bedtime.   Super Calcium 1500 (600 Ca) MG Tabs tablet Generic drug: calcium carbonate Take 1 tablet by mouth daily.   telmisartan 40 MG tablet Commonly known as: MICARDIS Take 1 tablet (40 mg total) by mouth daily.   triamcinolone cream 0.1 % Commonly known as: KENALOG Apply topically 2 (two) times daily.   triamterene-hydrochlorothiazide 37.5-25 MG tablet Commonly known as: MAXZIDE-25 Take 1 tablet by mouth daily.   TURMERIC PO Take 1 capsule by mouth daily.   Vitamin D (Ergocalciferol) 1.25 MG (50000 UNIT) Caps capsule Commonly known as: DRISDOL Take 1 capsule (50,000 Units total) by mouth every 7 (seven) days.         Objective:   BP 106/62   Pulse 81   Ht 5\' 10"  (1.778 m)   Wt 245 lb (111.1 kg)   SpO2 96%    BMI 35.15 kg/m   Wt Readings from Last 3 Encounters:  10/17/22 245 lb (111.1 kg)  09/26/22 243 lb (110.2 kg)  09/01/22 246 lb (111.6 kg)    Physical Exam Vitals and nursing note reviewed.  Musculoskeletal:     Left hip: Tenderness and crepitus present. No deformity or bony tenderness.  Neurological:     Mental Status: He is alert.       Assessment & Plan:   Problem List Items Addressed This Visit   None Visit Diagnoses     Primary osteoarthritis of left hip    -  Primary       Having trouble weightbearing and having trouble walking and cannot get up and down out of his 18 wheeler to work.  Planning to use FMLA or short-term disability and will get the paperwork back for office.  Surgery August 16, and his current state I do feel like he cannot go back to work until after the surgery. Has Celebrex and diclofenac, he does not know if the Celebrex is helping as much, instructed him that he can go ahead and go back and try the diclofenac tomorrow stopping the Celebrex today and see if it makes a difference.  Would not do any more steroids because do not want to delay his surgery any further. Follow up plan: Return if symptoms worsen or fail to improve.  Counseling provided for all of the vaccine components No orders of the defined types were placed in this encounter.   Arville Care, MD [facility] 10/17/2022, 9:00 AM

## 2022-10-18 ENCOUNTER — Telehealth: Payer: Self-pay | Admitting: Family Medicine

## 2022-10-18 NOTE — Telephone Encounter (Signed)
Pt called stating that he contacted [employer] for STD/FMLA and was advised to call Dr Louanne Skye and have him extend is out of work note until surgery date in August or after surgery, to make process easier and to make more sense.   Please advise.

## 2022-10-19 ENCOUNTER — Ambulatory Visit: Payer: 59 | Attending: Cardiovascular Disease

## 2022-10-19 DIAGNOSIS — Z01810 Encounter for preprocedural cardiovascular examination: Secondary | ICD-10-CM

## 2022-10-19 DIAGNOSIS — Z0279 Encounter for issue of other medical certificate: Secondary | ICD-10-CM

## 2022-10-19 NOTE — Telephone Encounter (Signed)
[employer] faxed FMLA forms to be completed and signed.  Form Fee Paid? (Y/N)       YES     If NO, form is placed on front office manager desk to hold until payment received. If YES, then form will be placed in the RX/HH Nurse Coordinators box for completion.  Form will not be processed until payment is received

## 2022-10-19 NOTE — Telephone Encounter (Signed)
Molli Knock that is fine, go ahead and extend the work note until his surgery date in August.

## 2022-10-20 ENCOUNTER — Other Ambulatory Visit: Payer: 59

## 2022-10-20 ENCOUNTER — Telehealth: Payer: Self-pay | Admitting: Cardiovascular Disease

## 2022-10-20 LAB — CBC
Hematocrit: 40.1 % (ref 37.5–51.0)
Hemoglobin: 13.3 g/dL (ref 13.0–17.7)
MCH: 30.3 pg (ref 26.6–33.0)
MCHC: 33.2 g/dL (ref 31.5–35.7)
MCV: 91 fL (ref 79–97)
Platelets: 173 10*3/uL (ref 150–450)
RBC: 4.39 x10E6/uL (ref 4.14–5.80)
RDW: 13.2 % (ref 11.6–15.4)
WBC: 11.3 10*3/uL — ABNORMAL HIGH (ref 3.4–10.8)

## 2022-10-20 LAB — BASIC METABOLIC PANEL
BUN/Creatinine Ratio: 38 — ABNORMAL HIGH (ref 10–24)
BUN: 30 mg/dL — ABNORMAL HIGH (ref 8–27)
CO2: 27 mmol/L (ref 20–29)
Calcium: 9.4 mg/dL (ref 8.6–10.2)
Chloride: 99 mmol/L (ref 96–106)
Creatinine, Ser: 0.8 mg/dL (ref 0.76–1.27)
Glucose: 88 mg/dL (ref 70–99)
Potassium: 4.2 mmol/L (ref 3.5–5.2)
Sodium: 139 mmol/L (ref 134–144)
eGFR: 99 mL/min/{1.73_m2} (ref >59)

## 2022-10-20 NOTE — Telephone Encounter (Signed)
Spoke to the patient, he is scheduled for CT angio chest of aorta on 6/24. He viewed lab results on MyChart and wanted to clarification he can proceed with the scheduled imaging. Will forward to MD and nurse for advise.

## 2022-10-20 NOTE — Telephone Encounter (Signed)
Pt would like a callback regarding his CT ANGIO CHEST AORTA W/CM &/OR appt on 6/24. Please advise

## 2022-10-20 NOTE — Telephone Encounter (Signed)
Information completed and forwarded to provider 

## 2022-10-21 ENCOUNTER — Other Ambulatory Visit: Payer: Self-pay | Admitting: Family Medicine

## 2022-10-21 ENCOUNTER — Ambulatory Visit: Payer: 59

## 2022-10-21 ENCOUNTER — Other Ambulatory Visit: Payer: Self-pay

## 2022-10-21 ENCOUNTER — Telehealth: Payer: Self-pay | Admitting: Family Medicine

## 2022-10-21 DIAGNOSIS — R7989 Other specified abnormal findings of blood chemistry: Secondary | ICD-10-CM

## 2022-10-21 DIAGNOSIS — M7741 Metatarsalgia, right foot: Secondary | ICD-10-CM

## 2022-10-21 DIAGNOSIS — D72829 Elevated white blood cell count, unspecified: Secondary | ICD-10-CM

## 2022-10-21 LAB — CBC
Hematocrit: 41.9 % (ref 37.5–51.0)
MCHC: 33.4 g/dL (ref 31.5–35.7)
MCV: 94 fL (ref 79–97)
RBC: 4.46 x10E6/uL (ref 4.14–5.80)

## 2022-10-21 LAB — HEPATIC FUNCTION PANEL

## 2022-10-21 NOTE — Telephone Encounter (Signed)
Dr. Louanne Skye completed and signed FMLA forms. They have been faxed to [facility] at fax number [phone]. Patient has been contacted and informed they are complete, there is a copy at front for him.

## 2022-10-21 NOTE — Telephone Encounter (Signed)
Nahser, Deloris Ping, MD  You; [facility] [address] [place] hour ago (8:04 AM)   I am not sure if there is still a question about the plan CT angio of aorta to be done on June 24 to evaluate his mild dilatation of the ascending aorta  PN   Returned call to patient to provide above information. He was just concerned about his elevated BUN & BUN/creatinine. Informed he need not be concerned. He was mistaken thinking that extra tylenol use could cause this-he's scheduled for hip replacement and has been taking "a lot" of tylenol to deal until the procedure. Advised that would be liver enzymes which were slightly bumped last month, but he can further discuss with PCP who's monitoring that if he's concerned. No further questions.

## 2022-10-21 NOTE — Telephone Encounter (Signed)
Pt coming in at 9:30 for labs. Wants A1C to also be checked. Can someone add future order for it.

## 2022-10-21 NOTE — Telephone Encounter (Signed)
Left vm for cb - explained in vm that insurance would not cover it with it just being done last month   Insurance only pays 4 A1c a year for diabetics and pt is not a diabetic

## 2022-10-22 LAB — HEPATIC FUNCTION PANEL
ALT: 22 IU/L (ref 0–44)
Bilirubin Total: 0.3 mg/dL (ref 0.0–1.2)
Bilirubin, Direct: <0.1 mg/dL (ref 0.00–0.40)
Total Protein: 6.7 g/dL (ref 6.0–8.5)

## 2022-10-22 LAB — CBC
Hemoglobin: 14 g/dL (ref 13.0–17.7)
MCH: 31.4 pg (ref 26.6–33.0)
Platelets: 181 10*3/uL (ref 150–450)
RDW: 13.3 % (ref 11.6–15.4)
WBC: 8.7 10*3/uL (ref 3.4–10.8)

## 2022-10-24 ENCOUNTER — Ambulatory Visit: Payer: 59

## 2022-10-31 ENCOUNTER — Ambulatory Visit (HOSPITAL_BASED_OUTPATIENT_CLINIC_OR_DEPARTMENT_OTHER)
Admission: RE | Admit: 2022-10-31 | Discharge: 2022-10-31 | Disposition: A | Discharge status: Home / Self Care | Payer: 59 | Source: Ambulatory Visit | Attending: Cardiovascular Disease | Admitting: Cardiovascular Disease

## 2022-10-31 DIAGNOSIS — I7121 Aneurysm of the ascending aorta, without rupture: Secondary | ICD-10-CM | POA: Insufficient documentation

## 2022-10-31 DIAGNOSIS — I10 Essential (primary) hypertension: Secondary | ICD-10-CM | POA: Insufficient documentation

## 2022-10-31 MED ORDER — IOHEXOL 350 MG/ML SOLN
100.0000 mL | Freq: Once | INTRAVENOUS | Status: AC | PRN
  Administered 2022-10-31: 100 mL via INTRAVENOUS

## 2022-11-03 NOTE — Telephone Encounter (Signed)
Received STD form via fax. Called patient to collect form fee. Was charged $21 since he already paid $29 for the 1st form for FMLA. Both payments have been collected and STD form was put in [place] box.

## 2022-11-03 NOTE — Telephone Encounter (Signed)
Information completed and forwarded to provider 

## 2022-11-12 ENCOUNTER — Other Ambulatory Visit: Payer: Self-pay | Admitting: Family Medicine

## 2022-11-12 DIAGNOSIS — F411 Generalized anxiety disorder: Secondary | ICD-10-CM

## 2022-11-12 DIAGNOSIS — F339 Major depressive disorder, recurrent, unspecified: Secondary | ICD-10-CM

## 2022-11-13 ENCOUNTER — Encounter: Payer: Self-pay | Admitting: Cardiovascular Disease

## 2022-11-13 NOTE — Progress Notes (Unsigned)
[name] Date of Birth  [birth year]       [facility]    [place] [address]  [address] [place], [place]  [address]   [place], [place]  [address] [phone]     [phone]   Fax  [phone]    Fax [phone]  Problem List: 1. Chest pain:  seen in the emergency room 2. Hypertension 3. Hyperlipidemia      I have seen [name] in the past for CP and abnormal ECG.  We have performed a cath in the past which was normal.   He also has had a stress myoview which was normal.    [name] had an episode of chest heaviness about a month ago.  Also associated with a head ache.  Was seen in the ER, work up was negative.   He has had some occasional recurent chest pain,.  Dull , shooting pain, comes and goes.   Also seems to have chest heaviness that comes on at random times ( not associated with any specific activity) which Seems to be relived with antiacids.  Has not seen his medical doctor.    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    He does not get any regular exercise.   He has taken Lovastatin in the past but stopped taking it due to  muscle aches.    Sep 10, 2013:  Tyas was last seen in January, 2015. He had a stress Myoview study for recurrent episodes of chest discomfort.  The Myoview showed no evidence of ischemia. His left ventricular systolic function was normal with an EF of 57%.  Still having some CP.   He works 14 hour days ( 2 AM to 4 PM)   so has limited time to exercise.  Tries not to eat any fried foods by sometimes does not have a good option.   November 03, 2014:  Lavone is doing well.  No CP.  Able to do all of his normal activities without any limitation .   Was seen by Dr. Christell Constant for pre-op visit and his ECG was found to be abnormal .    he's had T-wave inversions in the anterolateral leads for the past several years. He had a Myoview study last year which was normal. He had no evidence of  ischemia. He's done very well and has not had any cardiac symptoms.  April 23, 2018: Ms. seen back today for follow-up visit.  He was last seen 3 years ago. He has baseline T wave inversions in his lateral leads on his EKG. Is here for a check up  Has not had any particular issues.   No CP , no dyspnea. Works - drives a Multimedia programmer  Has developed leg swelling and chronic stasis changs  Still eats fast  Food  No regular exercise   November 19, 2018:  [name] is seen today for follow up for his CP and HTN Echocardiogram April 30, 2018 reveals normal left ventricular systolic function.  He has grade 2 diastolic dysfunction.  He has mild dilatation of the ascending aorta.  Doing well .   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 He has normal LV systolic function.  Grade 2 DD Mild dilatation of his asc. Aorta   Dealing with a pulled back currently  Some exercise  Avoids salt .   Breathing is good  Had been doing well with his diet before his back injury   November 15, 2021 [name] is seen for follow up of his CP and HTN Has grade II DD Mild dilatation of his asc. Aorta   CTA of the aorta June, 2023 .  She has an ascending aortic dimension of 41 mm at its greatest diameter  ( Was 40 mm in June 2021)   Still driving tractor trailer,  some activity with that  Not much cardio  November 14, 2022 [name] is seen for follow up of his mild dilatation of his ascending aorta, HTN, grade II DD   CT angio of the aorta from October 31, 2022 reveals ascending aorta measurement of 41 mm which is unchanged from previous scans.  Wt is 234 ( down 10 lbs from last year )   Has on ongoing dental issues Has had the tooth pulled but he thinks there is ongoing infection  Also needs a left hip  replacement   He is at low risk for his hip replacement from a cardiac standpoint. He will be okay to hold his aspirin for 5 to 7 days prior to the hip surgery if requested by the surgeon.  I have recommended that he get this dental issue completely resolved before he has new hip replacement as that would increase the risk of infection.  He is at low risk from a cardiac standpoint for his upcoming hip surgery.  He is on ASA from long ago . He may stop his ASA .      Current Outpatient Medications on File Prior to Visit  Medication Sig Dispense Refill   acetaminophen (TYLENOL) 500 MG tablet Take by mouth.     albuterol (VENTOLIN HFA) 108 (90 Base) MCG/ACT inhaler Inhale 2 puffs into the lungs every 6 (six) hours as needed. 18 g 5   calcium carbonate (SUPER CALCIUM) 1500 (600 Ca) MG TABS tablet Take 1 tablet by mouth daily.     calcium-vitamin D (OSCAL WITH D) 500-5 MG-MCG tablet Take 1 tablet by mouth.     Cinnamon 500 MG capsule Take 1,000 mg by mouth daily.      diclofenac (VOLTAREN) 75 MG EC tablet Take 1 tablet (75 mg total) by mouth 2 (two) times daily as needed. Use only if needed. 180 tablet 1   magnesium oxide (MAG-OX) 400 MG tablet Take 400 mg by mouth daily.     Multiple Vitamin (MULTIVITAMIN) tablet Take 1 tablet by mouth daily.     Omega-3 Fatty Acids (FISH OIL) 1000 MG CAPS Take 1 capsule by mouth daily.      omeprazole (PRILOSEC) 20 MG capsule TAKE 1 CAPSULE BY MOUTH TWICE DAILY BEFORE MEAL(S) 60 capsule 2   PARoxetine (PAXIL-CR) 25 MG 24 hr tablet Take 1 tablet by mouth once daily 90 tablet 0   Potassium 99 MG TABS Take 1 tablet by mouth daily.     pravastatin (PRAVACHOL) 40 MG tablet Take 1 tablet (40 mg total) by mouth at bedtime. 90 tablet 3   telmisartan (MICARDIS) 40 MG tablet Take 1 tablet (40 mg total) by mouth daily. 90 tablet 3   triamcinolone cream (KENALOG) 0.1 % Apply topically 2 (two) times daily.     triamterene-hydrochlorothiazide (MAXZIDE-25) 37.5-25 MG  tablet Take 1 tablet by mouth daily. 90 tablet 3   TURMERIC PO Take 1 capsule by mouth daily.      Vitamin D, Ergocalciferol, (DRISDOL) 1.25 MG (50000 UNIT) CAPS capsule Take 1 capsule (50,000 Units total) by mouth every 7 (seven) days. 4 capsule 0   No current facility-administered medications on file prior to visit.    Allergies  Allergen Reactions   Other Anaphylaxis    Make me hurt alot   Codeine     Unknown    Lovastatin     Makes me hurt alot   Niaspan [Niacin Er]     Make me hurt alot    Past Medical History:  Diagnosis Date   Aortic aneurysm (HCC)    Back pain    BPH (benign prostatic hypertrophy)    Cyst of left kidney    Edema of both lower extremities    GERD (gastroesophageal reflux disease)    Hemorrhoids    Hiatal hernia    Hyperlipidemia    Hypertension    Irregular heart beat    Joint pain    Obesity    Post-operative nausea and vomiting    Vitamin D deficiency     Past Surgical History:  Procedure Laterality Date   athroscopy of rt knee  02/02   henia - umbilical surgery  07/17/07   HERNIA REPAIR  05/15/2017   double hernia surgery   HIP SURGERY  11/2014   right hip   MOUTH SURGERY     VASECTOMY      Social History   Tobacco Use  Smoking Status Never  Smokeless Tobacco Never    Social History   Substance and Sexual Activity  Alcohol Use No   Alcohol/week: 0.0 standard drinks of alcohol    Family History  Problem Relation Age of Onset   Diabetes Mother    Scleroderma Sister    Diabetes Brother    Diabetes Brother    Colon cancer Other        family history    Prostate cancer Other        family history    Lung disease Neg Hx     Reviw of Systems:  Reviewed in the HPI.  All other systems are negative.   Physical Exam: Blood pressure 114/74, pulse 77, height 5\' 10"  (1.778 m), weight 234 lb (106.1 kg), SpO2 96 %.       GEN:  Well nourished, well developed in no acute distress HEENT: Normal NECK: No JVD; No carotid  bruits LYMPHATICS: No lymphadenopathy CARDIAC: RRR , no murmurs, rubs, gallops RESPIRATORY:  Clear to auscultation without rales, wheezing or rhonchi  ABDOMEN: Soft, non-tender, non-distended MUSCULOSKELETAL:  No edema; No deformity  SKIN: Warm and dry NEUROLOGIC:  Alert and oriented x 3     ECG:    EKG Interpretation Date/Time:  Monday November 14 2022 09:33:21 EDT Ventricular Rate:  77 PR Interval:  144 QRS Duration:  128 QT Interval:  386 QTC Calculation: 436 R Axis:   -70  Text Interpretation: Normal sinus rhythm Left axis deviation Right bundle branch block Possible Lateral infarct , age undetermined When compared with ECG of 12-Jun-2013 09:05, Right bundle branch block is now Present Confirmed by Kristeen Miss ([id number]) on 11/14/2022 10:02:44 AM         Assessment / Plan:    1. Chest pain:      no further CP    2. Hypertension -blood pressure is well controlled.  I  have advised him to work on weight loss.  Continue current medications.   3.   Mild asc. Aortic aneurism:   He has a very small ascending aortic aneurysm.  His ascending aorta now measures 41 mm.  It was 40 mm back in 2021.   Aneurism is fairly stable.  Will repeat aortic CTA in a year     4.  Obesity:    wt is down 10 lbs .  Continue weight loss efforts    4. Hyperlipidemia:  LDL was 62 in May, 2024 .  Continue current meds.    5.  Tooth infection :  has ongoing pain .  Tooth has been extracted.  Has been on 3 rounds of antibiotics.  I suggested that he go to an ear nose and throat specialist or an infectious disease specialist if there is concern about ongoing infection that cannot be resolved easily.  6.  Left hip pain: He needs to have a left hip replacement.  He is at low risk from a cardiac standpoint for his hip replacement.  He has been on aspirin for years as a precaution.  He does not have diagnosis of coronary artery disease.  He may stop his aspirin.   Kristeen Miss, MD  11/14/2022 10:02 AM     [facility] [address] [place],  [address] [place], [place]  [address] Pager [phone] Phone: [phone]; Fax: [phone]

## 2022-11-14 ENCOUNTER — Encounter: Payer: Self-pay | Admitting: Cardiovascular Disease

## 2022-11-14 ENCOUNTER — Ambulatory Visit: Payer: 59 | Admitting: Cardiovascular Disease

## 2022-11-14 VITALS — BP 114/74 | HR 77 | Ht 70.0 in | Wt 234.0 lb

## 2022-11-14 DIAGNOSIS — I7121 Aneurysm of the ascending aorta, without rupture: Secondary | ICD-10-CM

## 2022-11-14 DIAGNOSIS — I77819 Aortic ectasia, unspecified site: Secondary | ICD-10-CM

## 2022-11-14 DIAGNOSIS — Z01810 Encounter for preprocedural cardiovascular examination: Secondary | ICD-10-CM

## 2022-11-14 NOTE — Telephone Encounter (Signed)
STD and records sent to [facility] at [phone] via records release.

## 2022-11-14 NOTE — Patient Instructions (Addendum)
Medication Instructions:  Your physician has recommended you make the following change in your medication: Stop aspirin  *If you need a refill on your cardiac medications before your next appointment, please call your pharmacy*   Lab Work: none   Testing/Procedures: Non-Cardiac CT Angiography (CTA), is a special type of CT scan that uses a computer to produce multi-dimensional views of major blood vessels throughout the body. In CT angiography, a contrast material is injected through an IV to help visualize the blood vessels To be done around October 31, 2023    Follow-Up: At [facility], you and your health needs are our priority.  As part of our continuing mission to provide you with exceptional heart care, we have created designated Provider Care Teams.  These Care Teams include your primary Cardiologist (physician) and Advanced Practice Providers (APPs -  Physician Assistants and Nurse Practitioners) who all work together to provide you with the care you need, when you need it.  We recommend signing up for the patient portal called "MyChart".  Sign up information is provided on this After Visit Summary.  MyChart is used to connect with patients for Virtual Visits (Telemedicine).  Patients are able to view lab/test results, encounter notes, upcoming appointments, etc.  Non-urgent messages can be sent to your provider as well.   To learn more about what you can do with MyChart, go to [email or web address].    Your next appointment:   12 month(s)  Provider:   Kristeen Miss, MD     Other Instructions

## 2022-11-16 ENCOUNTER — Other Ambulatory Visit: Payer: Self-pay | Admitting: Family Medicine

## 2022-11-16 DIAGNOSIS — I10 Essential (primary) hypertension: Secondary | ICD-10-CM

## 2022-11-22 ENCOUNTER — Encounter: Payer: Self-pay | Admitting: Family Medicine

## 2022-11-22 ENCOUNTER — Ambulatory Visit (INDEPENDENT_AMBULATORY_CARE_PROVIDER_SITE_OTHER): Payer: 59

## 2022-11-22 ENCOUNTER — Ambulatory Visit (INDEPENDENT_AMBULATORY_CARE_PROVIDER_SITE_OTHER): Payer: 59 | Admitting: Family Medicine

## 2022-11-22 VITALS — BP 116/65 | HR 89 | Temp 98.7°F | Ht 70.0 in | Wt 238.0 lb

## 2022-11-22 DIAGNOSIS — M25572 Pain in left ankle and joints of left foot: Secondary | ICD-10-CM | POA: Diagnosis not present

## 2022-11-22 DIAGNOSIS — M1612 Unilateral primary osteoarthritis, left hip: Secondary | ICD-10-CM | POA: Diagnosis not present

## 2022-11-22 MED ORDER — GABAPENTIN 300 MG PO CAPS: 300.0000 mg | ORAL_CAPSULE | Freq: Every evening | ORAL | 3 refills | Status: DC | PRN

## 2022-11-22 NOTE — Progress Notes (Signed)
Subjective: CC: Ankle pain PCP: [name], DO Jacob Cline is a 62 y.o. male presenting to clinic today for:  1.  Ankle pain Patient reports left-sided ankle pain that has onset over the last 3 to 4 weeks.  He describes pain as sharp.  He points to the anterior aspect of the left ankle in its entirety as the area of pain and notes that it does radiate some to the lower leg.  He has had ongoing left-sided hip issues and is scheduled for surgery in a few weeks to correct.  He had corticosteroid injection done prior to his trip to [place] but he notes that that really was not helpful and he unfortunately was not able to engage in physical activity as he had planned due to this ongoing pain.  He is prescribed Voltaren, gabapentin and tramadol by his specialist has been utilizing these medications again recently but that has not noticed a great deal of improvement just yet.  He is nearly out of his gabapentin and asked for renewal on that today.   ROS: Per HPI  Allergies  Allergen Reactions   Other Anaphylaxis    Make me hurt alot   Codeine     Unknown    Lovastatin     Makes me hurt alot   Niaspan [Niacin Er]     Make me hurt alot   Past Medical History:  Diagnosis Date   Aortic aneurysm (HCC)    Back pain    BPH (benign prostatic hypertrophy)    Cyst of left kidney    Edema of both lower extremities    GERD (gastroesophageal reflux disease)    Hemorrhoids    Hiatal hernia    Hyperlipidemia    Hypertension    Irregular heart beat    Joint pain    Obesity    Post-operative nausea and vomiting    Vitamin D deficiency     Current Outpatient Medications:    acetaminophen (TYLENOL) 500 MG tablet, Take by mouth., Disp: , Rfl:    albuterol (VENTOLIN HFA) 108 (90 Base) MCG/ACT inhaler, Inhale 2 puffs into the lungs every 6 (six) hours as needed., Disp: 18 g, Rfl: 5   calcium carbonate (SUPER CALCIUM) 1500 (600 Ca) MG TABS tablet, Take 1 tablet by mouth daily., Disp:  , Rfl:    calcium-vitamin D (OSCAL WITH D) 500-5 MG-MCG tablet, Take 1 tablet by mouth., Disp: , Rfl:    Cinnamon 500 MG capsule, Take 1,000 mg by mouth daily. , Disp: , Rfl:    diclofenac (VOLTAREN) 75 MG EC tablet, Take 1 tablet (75 mg total) by mouth 2 (two) times daily as needed. Use only if needed., Disp: 180 tablet, Rfl: 1   magnesium oxide (MAG-OX) 400 MG tablet, Take 400 mg by mouth daily., Disp: , Rfl:    Multiple Vitamin (MULTIVITAMIN) tablet, Take 1 tablet by mouth daily., Disp: , Rfl:    Omega-3 Fatty Acids (FISH OIL) 1000 MG CAPS, Take 1 capsule by mouth daily. , Disp: , Rfl:    omeprazole (PRILOSEC) 20 MG capsule, TAKE 1 CAPSULE BY MOUTH TWICE DAILY BEFORE MEAL(S), Disp: 60 capsule, Rfl: 2   PARoxetine (PAXIL-CR) 25 MG 24 hr tablet, Take 1 tablet by mouth once daily, Disp: 90 tablet, Rfl: 0   Potassium 99 MG TABS, Take 1 tablet by mouth daily., Disp: , Rfl:    pravastatin (PRAVACHOL) 40 MG tablet, Take 1 tablet (40 mg total) by mouth at bedtime., Disp:  90 tablet, Rfl: 3   telmisartan (MICARDIS) 40 MG tablet, Take 1 tablet by mouth once daily, Disp: 90 tablet, Rfl: 0   traMADol (ULTRAM) 50 MG tablet, Take by mouth., Disp: , Rfl:    triamcinolone cream (KENALOG) 0.1 %, Apply topically 2 (two) times daily., Disp: , Rfl:    triamterene-hydrochlorothiazide (MAXZIDE-25) 37.5-25 MG tablet, Take 1 tablet by mouth daily., Disp: 90 tablet, Rfl: 3   TURMERIC PO, Take 1 capsule by mouth daily. , Disp: , Rfl:    Vitamin D, Ergocalciferol, (DRISDOL) 1.25 MG (50000 UNIT) CAPS capsule, Take 1 capsule (50,000 Units total) by mouth every 7 (seven) days., Disp: 4 capsule, Rfl: 0 Social History   Socioeconomic History   Marital status: Married    Spouse name: Not on file   Number of children: Not on file   Years of education: Not on file   Highest education level: 12th grade  Occupational History   Occupation: Full time   Occupation: Naval architect  Tobacco Use   Smoking status: Never    Smokeless tobacco: Never  Vaping Use   Vaping status: Never Used  Substance and Sexual Activity   Alcohol use: No    Alcohol/week: 0.0 standard drinks of alcohol   Drug use: No   Sexual activity: Not on file  Other Topics Concern   Not on file  Social History Narrative   Not on file   Social Determinants of Health   Financial Resource Strain: Low Risk  (10/16/2022)   Overall Financial Resource Strain (CARDIA)    Difficulty of Paying Living Expenses: Not hard at all  Food Insecurity: No Food Insecurity (10/16/2022)   Hunger Vital Sign    Worried About Running Out of Food in the Last Year: Never true    Ran Out of Food in the Last Year: Never true  Transportation Needs: No Transportation Needs (10/16/2022)   PRAPARE - Administrator, Civil Service (Medical): No    Lack of Transportation (Non-Medical): No  Physical Activity: Insufficiently Active (10/16/2022)   Exercise Vital Sign    Days of Exercise per Week: 2 days    Minutes of Exercise per Session: 10 min  Stress: No Stress Concern Present (10/16/2022)   [employer] of Occupational Health - Occupational Stress Questionnaire    Feeling of Stress : Not at all  Social Connections: Moderately Integrated (10/16/2022)   Social Connection and Isolation Panel [NHANES]    Frequency of Communication with Friends and Family: Once a week    Frequency of Social Gatherings with Friends and Family: Twice a week    Attends Religious Services: 1 to 4 times per year    Active Member of [employer] or Organizations: No    Attends Engineer, structural: Not on file    Marital Status: Married  Intimate Partner Violence: Unknown (08/11/2021)   Received from [employer], [facility]   HITS    Physically Hurt: Not on file    Insult or Talk Down To: Not on file    Threaten Physical Harm: Not on file    Scream or Curse: Not on file   Family History  Problem Relation Age of Onset   Diabetes Mother    Scleroderma Sister    Diabetes  Brother    Diabetes Brother    Colon cancer Other        family history    Prostate cancer Other        family history  Lung disease Neg Hx     Objective: Office vital signs reviewed. BP 116/65   Pulse 89   Temp 98.7 F (37.1 C)   Ht 5\' 10"  (1.778 m)   Wt 238 lb (108 kg)   SpO2 97%   BMI 34.15 kg/m   Physical Examination:  General: Awake, alert, well nourished, No acute distress MSK: very antalgic gait. Uses can for ambulation  Left ankle: no gross joint swelling, erythema.  TTP to anterior ankle joint.  No deformity appreciated.  Has some general atrophy of left leg compared to right on exam.  Assessment/ Plan: 62 y.o. male   Acute left ankle pain - Plan: DG Ankle Complete Left  Primary osteoarthritis of left hip - Plan: gabapentin (NEURONTIN) 300 MG capsule  Suspect stress on ankle due to impaired gait from hip.  Xray to rule out stress fracture given recent trip to beach.  Recommend bracing, ongoing treatment with current meds.  Gabapentin renewed.  No orders of the defined types were placed in this encounter.  No orders of the defined types were placed in this encounter.    [name], DO [facility] [phone]

## 2022-11-28 ENCOUNTER — Other Ambulatory Visit: Payer: Self-pay | Admitting: Family Medicine

## 2022-11-28 ENCOUNTER — Telehealth: Payer: Self-pay | Admitting: Family Medicine

## 2022-11-28 DIAGNOSIS — E7800 Pure hypercholesterolemia, unspecified: Secondary | ICD-10-CM

## 2022-11-28 DIAGNOSIS — I10 Essential (primary) hypertension: Secondary | ICD-10-CM

## 2022-11-28 NOTE — Telephone Encounter (Signed)
Patient aware results are not back

## 2022-11-28 NOTE — Telephone Encounter (Signed)
Calling to check on results from x ray on 7/16

## 2022-12-01 ENCOUNTER — Encounter: Payer: Self-pay | Admitting: Family Medicine

## 2022-12-01 ENCOUNTER — Ambulatory Visit (INDEPENDENT_AMBULATORY_CARE_PROVIDER_SITE_OTHER): Payer: 59 | Admitting: Family Medicine

## 2022-12-01 VITALS — BP 108/67 | HR 79 | Temp 98.5°F | Ht 70.0 in | Wt 231.0 lb

## 2022-12-01 DIAGNOSIS — R300 Dysuria: Secondary | ICD-10-CM

## 2022-12-01 LAB — URINALYSIS, ROUTINE W REFLEX MICROSCOPIC
Bilirubin, UA: NEGATIVE
Glucose, UA: NEGATIVE
Leukocytes,UA: NEGATIVE
Nitrite, UA: NEGATIVE
RBC, UA: NEGATIVE
Specific Gravity, UA: >1.03 — ABNORMAL HIGH (ref 1.005–1.030)
Urobilinogen, Ur: 0.2 mg/dL (ref 0.2–1.0)
pH, UA: 5.5 (ref 5.0–7.5)

## 2022-12-01 LAB — MICROSCOPIC EXAMINATION
RBC, Urine: NONE SEEN /hpf (ref 0–2)
Renal Epithel, UA: NONE SEEN /hpf

## 2022-12-01 MED ORDER — CEPHALEXIN 500 MG PO CAPS
500.0000 mg | ORAL_CAPSULE | Freq: Two times a day (BID) | ORAL | 0 refills | Status: DC
Start: 2022-12-01 — End: 2022-12-09

## 2022-12-01 NOTE — Progress Notes (Signed)
Acute Office Visit  Subjective:     Patient ID: Jacob Cline, male    DOB: 15-Oct-1960, 62 y.o.   MRN: [id number]  Chief Complaint  Patient presents with   Dysuria    Urgency with mixed result frequency    Dysuria  This is a new problem. Episode onset: 7-10 days. The problem occurs every urination. The problem has been unchanged. The quality of the pain is described as burning (pressure). The pain is mild. There has been no fever. Associated symptoms include frequency, hesitancy and urgency. Pertinent negatives include no chills, discharge, flank pain, hematuria, nausea or vomiting. He has tried nothing for the symptoms. There is no history of kidney stones, recurrent UTIs or a urological procedure.   Recently started on tramadol by ortho for his hip pain. He has stopped taking this for the last few days as he saw of the info that tramadol can use urinary symptoms and UTIs. He reports sometimes have somewhat improved since stopping tramadol. Hx of BPH. Had recent normal PSA.  Review of Systems  Constitutional:  Negative for chills.  Gastrointestinal:  Negative for nausea and vomiting.  Genitourinary:  Positive for dysuria, frequency, hesitancy and urgency. Negative for flank pain and hematuria.        Objective:    BP 108/67   Pulse 79   Temp 98.5 F (36.9 C)   Ht 5\' 10"  (1.778 m)   Wt 231 lb (104.8 kg)   SpO2 95%   BMI 33.15 kg/m    Physical Exam Vitals and nursing note reviewed.  Constitutional:      General: He is not in acute distress.    Appearance: He is obese. He is not ill-appearing, toxic-appearing or diaphoretic.  Cardiovascular:     Heart sounds: No murmur heard. Pulmonary:     Effort: Pulmonary effort is normal. No respiratory distress.     Breath sounds: Normal breath sounds. No wheezing.  Abdominal:     General: Bowel sounds are normal. There is no distension.     Palpations: Abdomen is soft.     Tenderness: There is no abdominal tenderness. There  is no right CVA tenderness, left CVA tenderness, guarding or rebound.  Musculoskeletal:     Cervical back: Neck supple. No rigidity.     Right lower leg: No edema.     Left lower leg: No edema.  Skin:    General: Skin is warm.  Neurological:     Mental Status: He is alert and oriented to person, place, and time.     Gait: Gait abnormal (antalgic, using rolling walker).  Psychiatric:        Mood and Affect: Mood normal.        Behavior: Behavior normal.     Urine dipstick shows positive for protein, positive for urobilinogen, and positive for ketones.  Micro exam: 0-5 WBC's per HPF, few+ bacteria, and mucus, threads, and hyaline casts seen.       Assessment & Plan:   Jacob Cline" was seen today for dysuria.  Diagnoses and all orders for this visit:  Dysuria UA with few bacteria and mucus treads. Will treat with keflex for possible UTI since he is symptomatic pending culture. Discussed return precautions.  -     Urinalysis, Routine w reflex microscopic -     Urine Culture -     cephALEXin (KEFLEX) 500 MG capsule; Take 1 capsule (500 mg total) by mouth 2 (two) times daily.   The patient indicates  understanding of these issues and agrees with the plan.   Gabriel Earing, FNP

## 2022-12-09 ENCOUNTER — Ambulatory Visit (INDEPENDENT_AMBULATORY_CARE_PROVIDER_SITE_OTHER): Payer: 59 | Admitting: Family Medicine

## 2022-12-09 ENCOUNTER — Encounter: Payer: Self-pay | Admitting: Family Medicine

## 2022-12-09 VITALS — BP 94/56 | HR 77 | Temp 98.0°F | Ht 70.0 in | Wt 231.0 lb

## 2022-12-09 DIAGNOSIS — I10 Essential (primary) hypertension: Secondary | ICD-10-CM | POA: Diagnosis not present

## 2022-12-09 DIAGNOSIS — R7303 Prediabetes: Secondary | ICD-10-CM

## 2022-12-09 DIAGNOSIS — M1612 Unilateral primary osteoarthritis, left hip: Secondary | ICD-10-CM | POA: Diagnosis not present

## 2022-12-09 LAB — BAYER DCA HB A1C WAIVED: HB A1C (BAYER DCA - WAIVED): 6 % — ABNORMAL HIGH (ref 4.8–5.6)

## 2022-12-09 MED ORDER — GABAPENTIN 300 MG PO CAPS
300.0000 mg | ORAL_CAPSULE | Freq: Three times a day (TID) | ORAL | 3 refills | Status: DC
Start: 2022-12-09 — End: 2023-02-16

## 2022-12-09 NOTE — Progress Notes (Signed)
Subjective: IH:KVQQV PCP: [name], DO ZDG:LOVFI H Pracht is a 62 y.o. male presenting to clinic today for:  1. Prediabetes/ left hip pain/ sciatica Patient is accompanied to appt by his wife.  He reports that he has been trying to cut back on sugar/ carbs since last visit.  Mobility continues to be limited due to left sided hip/ back pain.  Surgery planned in 2.5 weeks.  Compliant with gabapentin but would be interested in dosing more frequently since he will have to come off NSAID before surgery.    Lab Results  Component Value Date   HGBA1C 6.2 (H) 09/26/2022   2. HTN BP typically 116 systolic at home. No dizziness.  Compliant with Maxzide and Micardis. No CP, SOB, fall   ROS: Per HPI  Allergies  Allergen Reactions   Other Anaphylaxis    Make me hurt alot   Codeine     Unknown    Lovastatin     Makes me hurt alot   Niaspan [Niacin Er]     Make me hurt alot   Past Medical History:  Diagnosis Date   Aortic aneurysm (HCC)    Back pain    BPH (benign prostatic hypertrophy)    Cyst of left kidney    Edema of both lower extremities    GERD (gastroesophageal reflux disease)    Hemorrhoids    Hiatal hernia    Hyperlipidemia    Hypertension    Irregular heart beat    Joint pain    Obesity    Post-operative nausea and vomiting    Vitamin D deficiency     Current Outpatient Medications:    acetaminophen (TYLENOL) 500 MG tablet, Take by mouth., Disp: , Rfl:    albuterol (VENTOLIN HFA) 108 (90 Base) MCG/ACT inhaler, Inhale 2 puffs into the lungs every 6 (six) hours as needed., Disp: 18 g, Rfl: 5   calcium carbonate (SUPER CALCIUM) 1500 (600 Ca) MG TABS tablet, Take 1 tablet by mouth daily., Disp: , Rfl:    calcium-vitamin D (OSCAL WITH D) 500-5 MG-MCG tablet, Take 1 tablet by mouth., Disp: , Rfl:    cephALEXin (KEFLEX) 500 MG capsule, Take 1 capsule (500 mg total) by mouth 2 (two) times daily., Disp: 14 capsule, Rfl: 0   Cinnamon 500 MG capsule, Take 1,000  mg by mouth daily. , Disp: , Rfl:    diclofenac (VOLTAREN) 75 MG EC tablet, Take 1 tablet (75 mg total) by mouth 2 (two) times daily as needed. Use only if needed., Disp: 180 tablet, Rfl: 1   gabapentin (NEURONTIN) 300 MG capsule, Take 1 capsule (300 mg total) by mouth at bedtime as needed (neuropathic pain)., Disp: 90 capsule, Rfl: 3   magnesium oxide (MAG-OX) 400 MG tablet, Take 400 mg by mouth daily., Disp: , Rfl:    meloxicam (MOBIC) 7.5 MG tablet, Take by mouth., Disp: , Rfl:    Multiple Vitamin (MULTIVITAMIN) tablet, Take 1 tablet by mouth daily., Disp: , Rfl:    Omega-3 Fatty Acids (FISH OIL) 1000 MG CAPS, Take 1 capsule by mouth daily. , Disp: , Rfl:    omeprazole (PRILOSEC) 20 MG capsule, TAKE 1 CAPSULE BY MOUTH TWICE DAILY BEFORE MEAL(S), Disp: 60 capsule, Rfl: 2   PARoxetine (PAXIL-CR) 25 MG 24 hr tablet, Take 1 tablet by mouth once daily, Disp: 90 tablet, Rfl: 0   Potassium 99 MG TABS, Take 1 tablet by mouth daily., Disp: , Rfl:    pravastatin (PRAVACHOL) 40 MG tablet,  TAKE 1 TABLET BY MOUTH AT BEDTIME, Disp: 30 tablet, Rfl: 5   telmisartan (MICARDIS) 40 MG tablet, Take 1 tablet by mouth once daily, Disp: 90 tablet, Rfl: 0   triamcinolone cream (KENALOG) 0.1 %, Apply topically 2 (two) times daily., Disp: , Rfl:    triamterene-hydrochlorothiazide (MAXZIDE-25) 37.5-25 MG tablet, Take 1 tablet by mouth once daily, Disp: 30 tablet, Rfl: 5   TURMERIC PO, Take 1 capsule by mouth daily. , Disp: , Rfl:    Vitamin D, Ergocalciferol, (DRISDOL) 1.25 MG (50000 UNIT) CAPS capsule, Take 1 capsule (50,000 Units total) by mouth every 7 (seven) days., Disp: 4 capsule, Rfl: 0 Social History   Socioeconomic History   Marital status: Married    Spouse name: Not on file   Number of children: Not on file   Years of education: Not on file   Highest education level: 12th grade  Occupational History   Occupation: Full time   Occupation: Naval architect  Tobacco Use   Smoking status: Never   Smokeless  tobacco: Never  Vaping Use   Vaping status: Never Used  Substance and Sexual Activity   Alcohol use: No    Alcohol/week: 0.0 standard drinks of alcohol   Drug use: No   Sexual activity: Not on file  Other Topics Concern   Not on file  Social History Narrative   Not on file   Social Determinants of Health   Financial Resource Strain: Low Risk  (10/16/2022)   Overall Financial Resource Strain (CARDIA)    Difficulty of Paying Living Expenses: Not hard at all  Food Insecurity: No Food Insecurity (10/16/2022)   Hunger Vital Sign    Worried About Running Out of Food in the Last Year: Never true    Ran Out of Food in the Last Year: Never true  Transportation Needs: No Transportation Needs (10/16/2022)   PRAPARE - Administrator, Civil Service (Medical): No    Lack of Transportation (Non-Medical): No  Physical Activity: Insufficiently Active (10/16/2022)   Exercise Vital Sign    Days of Exercise per Week: 2 days    Minutes of Exercise per Session: 10 min  Stress: No Stress Concern Present (10/16/2022)   [employer] of Occupational Health - Occupational Stress Questionnaire    Feeling of Stress : Not at all  Social Connections: Moderately Integrated (10/16/2022)   Social Connection and Isolation Panel [NHANES]    Frequency of Communication with Friends and Family: Once a week    Frequency of Social Gatherings with Friends and Family: Twice a week    Attends Religious Services: 1 to 4 times per year    Active Member of [employer] or Organizations: No    Attends Engineer, structural: Not on file    Marital Status: Married  Intimate Partner Violence: Unknown (08/11/2021)   Received from [employer], [facility]   HITS    Physically Hurt: Not on file    Insult or Talk Down To: Not on file    Threaten Physical Harm: Not on file    Scream or Curse: Not on file   Family History  Problem Relation Age of Onset   Diabetes Mother    Scleroderma Sister    Diabetes Brother     Diabetes Brother    Colon cancer Other        family history    Prostate cancer Other        family history    Lung disease Neg Hx  Objective: Office vital signs reviewed. BP (!) 94/56   Pulse 77   Temp 98 F (36.7 C) (Oral)   Ht 5\' 10"  (1.778 m)   Wt 231 lb (104.8 kg)   SpO2 98%   BMI 33.15 kg/m   Physical Examination:  General: Awake, alert, well nourished, No acute distress HEENT: sclera white, MMM Cardio: regular rate and rhythm, S1S2 heard, no murmurs appreciated Pulm: clear to auscultation bilaterally, no wheezes, rhonchi or rales; normal work of breathing on room air  Assessment/ Plan: 63 y.o. male   Pre-diabetes - Plan: Bayer DCA Hb A1c Waived  Primary osteoarthritis of left hip - Plan: gabapentin (NEURONTIN) 300 MG capsule  Essential hypertension  A1c 6.0 today.  Continue lifestyle modification to reduce weight/ sugar. Counseled on low carb diet/ exercise.  Has been cleared by GI for surgery on the left hip.  Okay to go up on gabapentin to 3 times daily and the Rx has been sent.  Caution sedation  Blood pressure is low but he was nonfasting and home blood pressures are reassuring.  We discussed that if he has persistent blood pressures less than 100 systolic I would like him to reduce his Maxide by half.  Both he and his wife voiced good understanding and will follow-up as directed   No orders of the defined types were placed in this encounter.  No orders of the defined types were placed in this encounter.    [name], DO [facility] [phone]

## 2022-12-12 ENCOUNTER — Telehealth: Payer: Self-pay | Admitting: Family Medicine

## 2022-12-12 NOTE — Telephone Encounter (Signed)
Much better!! Thank you!

## 2022-12-12 NOTE — Telephone Encounter (Signed)
Patient aware and verbalizes understanding. 

## 2022-12-24 ENCOUNTER — Other Ambulatory Visit: Payer: Self-pay | Admitting: Family Medicine

## 2022-12-24 DIAGNOSIS — K219 Gastro-esophageal reflux disease without esophagitis: Secondary | ICD-10-CM

## 2022-12-26 ENCOUNTER — Telehealth: Payer: Self-pay

## 2022-12-26 ENCOUNTER — Ambulatory Visit: Payer: 59 | Admitting: Neurology

## 2022-12-26 NOTE — Transitions of Care (Post Inpatient/ED Visit) (Signed)
12/26/2022  Name: Jacob Cline MRN: [id number] DOB: Dec 11, 1960  Today's TOC FU Call Status: Today's TOC FU Call Status:: Successful TOC FU Call Completed TOC FU Call Complete Date: 12/26/22  Transition Care Management Follow-up Telephone Call Date of Discharge: 12/24/22 Discharge Facility: Other ([facility]) Name of Other ([facility]) Discharge Facility: [facility] Type of Discharge: Inpatient Admission Primary Inpatient Discharge Diagnosis:: left hip replacement How have you been since you were released from the hospital?: Better Any questions or concerns?: No  Items Reviewed: Did you receive and understand the discharge instructions provided?: Yes Medications obtained,verified, and reconciled?: Yes (Medications Reviewed) Any new allergies since your discharge?: No Dietary orders reviewed?: Yes Do you have support at home?: Yes People in Home: spouse  Medications Reviewed Today: Medications Reviewed Today     Reviewed by Karena Addison, LPN (Licensed Practical Nurse) on 12/26/22 at 1520  Med List Status: <None>   Medication Order Taking? Sig Documenting Provider Last Dose Status Informant  acetaminophen (TYLENOL) 500 MG tablet [id number] No Take by mouth. [provider] Taking Active   albuterol (VENTOLIN HFA) 108 (90 Base) MCG/ACT inhaler [id number] No Inhale 2 puffs into the lungs every 6 (six) hours as needed. Charlott Holler, MD Taking Active   calcium carbonate (SUPER CALCIUM) 1500 (600 Ca) MG TABS tablet [id number] No Take 1 tablet by mouth daily. [provider] Taking Active   calcium-vitamin D (OSCAL WITH D) 500-5 MG-MCG tablet [id number] No Take 1 tablet by mouth. [provider] Taking Active   Cinnamon 500 MG capsule [id number] No Take 1,000 mg by mouth daily.  [provider] Taking Active            Med Note Aurther Loft, ASHELEY Justice Rocher Nov 19, 2018  8:06 AM)    diclofenac (VOLTAREN) 75 MG EC tablet [id number] No Take 1  tablet (75 mg total) by mouth 2 (two) times daily as needed. Use only if needed. Delynn Flavin M, DO Taking Active   gabapentin (NEURONTIN) 300 MG capsule [id number]  Take 1 capsule (300 mg total) by mouth 3 (three) times daily. Delynn Flavin M, DO  Active   magnesium oxide (MAG-OX) 400 MG tablet [id number] No Take 400 mg by mouth daily. [provider] Taking Active   meloxicam (MOBIC) 7.5 MG tablet [id number] No Take by mouth. [provider] Taking Active   Multiple Vitamin (MULTIVITAMIN) tablet [id number] No Take 1 tablet by mouth daily. [provider] Taking Active            Med Note Aurther Loft, ASHELEY Justice Rocher Nov 19, 2018  8:06 AM)    Omega-3 Fatty Acids (FISH OIL) 1000 MG CAPS [id number] No Take 1 capsule by mouth daily.  [provider] Taking Active            Med Note Shon Hough Nov 19, 2018  8:06 AM)    omeprazole (PRILOSEC) 20 MG capsule [id number]  TAKE 1 CAPSULE BY MOUTH TWICE DAILY BEFORE MEAL(S) Delynn Flavin M, DO  Active   PARoxetine (PAXIL-CR) 25 MG 24 hr tablet [id number] No Take 1 tablet by mouth once daily [name], DO Taking Active   Potassium 99 MG TABS [id number] No Take 1 tablet by mouth daily. [provider] Taking Active Self  pravastatin (PRAVACHOL) 40 MG tablet [id number] No TAKE 1 TABLET BY MOUTH AT BEDTIME Delynn Flavin M, DO Taking Active   telmisartan (MICARDIS) 40 MG tablet [id number] No  Take 1 tablet by mouth once daily Delynn Flavin M, DO Taking Active   triamcinolone cream (KENALOG) 0.1 % [id number] No Apply topically 2 (two) times daily. [provider] Taking Active   triamterene-hydrochlorothiazide (MAXZIDE-25) 37.5-25 MG tablet [id number] No Take 1 tablet by mouth once daily Delynn Flavin M, DO Taking Active   TURMERIC PO [id number] No Take 1 capsule by mouth daily.  [provider] Taking Active   Vitamin D, Ergocalciferol, (DRISDOL) 1.25 MG (50000 UNIT) CAPS  capsule [id number] No Take 1 capsule (50,000 Units total) by mouth every 7 (seven) days. [place] Nickel, DO Taking Active             Home Care and Equipment/Supplies: Were Home Health Services Ordered?: Yes Name of [facility] Health Agency:: [facility] Has Agency set up a time to come to your home?: No Any new equipment or medical supplies ordered?: Yes Name of Medical supply agency?: unknown Were you able to get the equipment/medical supplies?: Yes Do you have any questions related to the use of the equipment/supplies?: No  Functional Questionnaire: Do you need assistance with bathing/showering or dressing?: Yes Do you need assistance with meal preparation?: No Do you need assistance with eating?: No Do you have difficulty maintaining continence: No Do you need assistance with getting out of bed/getting out of a chair/moving?: No Do you have difficulty managing or taking your medications?: No  Follow up appointments reviewed: PCP Follow-up appointment confirmed?: NA Specialist Hospital Follow-up appointment confirmed?: No Reason Specialist Follow-Up Not Confirmed: Patient has Specialist Provider Number and will Call for Appointment Do you need transportation to your follow-up appointment?: No Do you understand care options if your condition(s) worsen?: Yes-patient verbalized understanding    SIGNATURE Karena Addison, LPN [facility] Nurse Health Advisor Direct Dial [phone]

## 2022-12-28 ENCOUNTER — Telehealth: Payer: Self-pay | Admitting: Family Medicine

## 2022-12-28 ENCOUNTER — Telehealth: Payer: Self-pay

## 2022-12-28 ENCOUNTER — Telehealth: Payer: Self-pay | Admitting: Cardiovascular Disease

## 2022-12-28 DIAGNOSIS — I10 Essential (primary) hypertension: Secondary | ICD-10-CM

## 2022-12-28 NOTE — Telephone Encounter (Signed)
Have him cut the telmisartan in half and continue monitoring BPs

## 2022-12-28 NOTE — Telephone Encounter (Signed)
New Message:      Patient said he had hip surgery on last Friday(12-22-21). After he got home, he have been having blood pressure problems.    Pt c/o BP issue:  1. What are your last 5 BP readings?  Yesterday morning 96/61 pulse 72, later 101/69 pulse 72,  107/69 pulse 77, 109/69 pulse 76 113/69 pulse 94, 125/75 pulse 98- today 77/49 pulse 79 this morning ,  78/49 pulse 80 , now it is 120/75 pulse 65    2. Are you having any other symptoms (ex. Dizziness, headache, blurred vision, passed out)?  Little flush feeling in his head   3. What is your medication issue? Blood pressure tunning low- he said for the last 3 days, he have not taken his  Triamterene Hydrochlorothiazide.

## 2022-12-28 NOTE — Telephone Encounter (Signed)
Returned call to patient who states he is still eating and drinking good, but noticing low BP's in the morning and takes until about lunch to start getting back to normal. This morning was 77/49 out of bed, slowly increased throughout the day. 115/50mmhg at time of call.  Reviewed MAR with patient. He is taking Gabapentin 300mg  2-3x daily and Norco 10/325mg  (approx 5 tabs per day). He has not taken any Maxizide for the last two days. Advised that he hold his Telmisartan 40mg  until BP stabilizes. Specifically informed to keep tracking BP and if consistently 130/80 or greater, to add it back in and continue to track, then can add back in the Maxzide if needed. Pt reportedly has lost approx 12 lbs since last visit which I informed can impact BP as well. Advised he continue to push electrolyte fluids, cut down on the Norco, supplementing with tylenol OTC (max of 3gm/every day), and stool softners. He denies any symptoms related to low bp-specifically denies dizziness, lethargy, confusion, falls, unsteady gait. He will continue to monitor and call us back if needed.

## 2022-12-28 NOTE — Telephone Encounter (Signed)
Patient called back and stated that he did not know why he needed this ppw filled out. Will call to make sure [employer] received and call us back if he still needs it completed by the provider. Aware that we will not start until it is paid for.

## 2022-12-28 NOTE — Telephone Encounter (Signed)
[employer] faxed STD and FMLA forms to be completed  Form Fee Paid? (Y/N)       NO     If NO, form is placed on front office manager desk to hold until payment received.  Form will not be processed until payment is received   *Called patient on 8/21 and was unable to reach*

## 2022-12-28 NOTE — Telephone Encounter (Signed)
Patient calls to report his blood pressure has been low in the mornings for the last three days, he reports it seems to go up to a more normal blood pressure by the afternoon.  He has not taken his Maxide in the last three days but is still taking the Telmisartan.  His readings today are as follows:  78/49 pulse 80 82/55 95/64 120/75 108/67 pulse 83 112/64 pulse 83  He has also called his cardiologist, Dr. Elease Hashimoto, and has left a message there with this information but has not received a call back yet.

## 2022-12-28 NOTE — Telephone Encounter (Signed)
Left vm for cb

## 2022-12-30 ENCOUNTER — Telehealth: Payer: Self-pay | Admitting: Family Medicine

## 2022-12-30 ENCOUNTER — Encounter: Payer: Self-pay | Admitting: Family Medicine

## 2022-12-30 DIAGNOSIS — L52 Erythema nodosum: Secondary | ICD-10-CM

## 2022-12-30 DIAGNOSIS — L089 Local infection of the skin and subcutaneous tissue, unspecified: Secondary | ICD-10-CM

## 2022-12-30 MED ORDER — DOXYCYCLINE HYCLATE 100 MG PO TABS: 100.0000 mg | ORAL_TABLET | Freq: Two times a day (BID) | ORAL | 0 refills | Status: AC

## 2022-12-30 NOTE — Telephone Encounter (Signed)
Pt has been notified.

## 2022-12-30 NOTE — Telephone Encounter (Signed)
Pt called to let Dr Nadine Counts know that he is having the same issue with left leg as he did before he had the left hip surgery. On lower left leg near ankle, something called Erythema Nodosum. Says its blistered up with a white spot in the center. Was told previously to take 600mg  of ibuprofen 1x daily. Pt is not currently taking Diclofenac. Wants to know if it will be ok to take the 600mg  of ibuprofen again?

## 2022-12-30 NOTE — Telephone Encounter (Signed)
Yes, as long as he is NOT taking Difclofenac OR the Meloxicam  this is fine.

## 2023-01-03 MED ORDER — TELMISARTAN 40 MG PO TABS
20.0000 mg | ORAL_TABLET | Freq: Every day | ORAL | 0 refills | Status: DC
Start: 2023-01-03 — End: 2023-03-14

## 2023-01-03 NOTE — Telephone Encounter (Signed)
Patient aware.

## 2023-01-03 NOTE — Addendum Note (Signed)
Addended by: Quay Burow on: 01/03/2023 12:32 PM   Modules accepted: Orders

## 2023-01-04 ENCOUNTER — Telehealth: Payer: Self-pay | Admitting: Cardiovascular Disease

## 2023-01-04 NOTE — Telephone Encounter (Signed)
Patient stated when he had surgery last week they told him to stop his telmisartan 40 mg until his BP increased because it was running low. He also wanted to know if his Telmisartan dose should be 40 mg or 20 mg. His pcp office refilled yesterday for 20 mg

## 2023-01-04 NOTE — Telephone Encounter (Signed)
Pt c/o medication issue:  1. Name of Medication: Maxzide  Telmisartan 40mg    2. How are you currently taking this medication (dosage and times per day)? As Written  3. Are you having a reaction (difficulty breathing--STAT)? No  4. What is your medication issue? Patient is calling because he was told to stop taking these medications until his BP goes up. Patient stated this morning his BP was 126/75 HR 68 and at lunch his BP 133/85 HR 65. Patient would like to know if he should start taking these medications again.

## 2023-01-04 NOTE — Telephone Encounter (Signed)
Returned call to patient about dose of Telmisartan. We had advised pt hold all BP meds d/t hypotension post-surgery (see 8/21 encounter). Pt's PCP was sending him in an rx forTelmisartan at a lower dose of 20mg  and patient couldn't remember which BP med he was supposed to start back first (Maxzide vs Micardis). Pt provides following BP log: 8/28 126/75 (AM), 133/85 (noon) 8/27 127/76(AM) 129/78(noon) 8/26 125/77 (AM) 130/80(noon) 8/25 120/71 (AM) 118/77(noon) 8/24 118/81 (AM) 122/73(noon) Advised patient that he will need to restart Telmisartan before starting back on Micardis, but that his BP trend doesn't merit that yet. Educated that until he sees his Bp trending mid 130's systolic and higher that he can remain off both, but to initiate at 20mg  telmisartan, then increase to 40 if needed, then will go back to maxzide if necessary. He will call if any questions. Verbalized understanding.

## 2023-02-06 ENCOUNTER — Ambulatory Visit: Payer: 59 | Admitting: Neurology

## 2023-02-11 ENCOUNTER — Other Ambulatory Visit: Payer: Self-pay | Admitting: Family Medicine

## 2023-02-11 DIAGNOSIS — F339 Major depressive disorder, recurrent, unspecified: Secondary | ICD-10-CM

## 2023-02-11 DIAGNOSIS — F411 Generalized anxiety disorder: Secondary | ICD-10-CM

## 2023-02-15 NOTE — Progress Notes (Unsigned)
[facility] [address] [place]. [place] [address] [phone]       OFFICE FOLLOW UP NOTE  Mr. Jacob Cline Date of Birth:  April 10, 1961 Medical Record Number:  [id number]    Primary neurologist: Dr. Vickey Huger Reason for visit: CPAP follow-up    SUBJECTIVE:   CHIEF COMPLAINT:  No chief complaint on file.   Follow-up visit:  Prior visit:  Brief HPI:   DAERON AARDEMA is a 62 y.o. male who was initially evaluated by Dr. Vickey Huger on 08/17/2020 as requested by [employer] to evaluate for sleep apnea.  HST 08/28/2020 showed severe OSA with total AHI of 53.7/h and multiple brief oxygen desaturations.  AutoPap initiated 09/17/2020.  Patient completed ONO on CPAP with persistent nocturnal hypoxemia and recommended bleeding 2L/min O2 via CPAP.   At prior visit, complained of increased fatigue after initiating oxygen, repeated ONO on CPAP with 2L bled which showed improvement of nocturnal hypoxemia.     Interval history:  Past 30-day compliance report shows excellent compliance and optimal residual AHI.            ROS:   14 system review of systems performed and negative with exception of those listed in HPI  PMH:  Past Medical History:  Diagnosis Date   Aortic aneurysm (HCC)    Back pain    BPH (benign prostatic hypertrophy)    Cyst of left kidney    Edema of both lower extremities    GERD (gastroesophageal reflux disease)    Hemorrhoids    Hiatal hernia    Hyperlipidemia    Hypertension    Irregular heart beat    Joint pain    Obesity    Post-operative nausea and vomiting    Vitamin D deficiency     PSH:  Past Surgical History:  Procedure Laterality Date   athroscopy of rt knee  02/02   henia - umbilical surgery  07/17/07   HERNIA REPAIR  05/15/2017   double hernia surgery   HIP SURGERY  11/2014   right hip   MOUTH SURGERY     VASECTOMY      Social History:  Social History   Socioeconomic History   Marital status: Married     Spouse name: Not on file   Number of children: Not on file   Years of education: Not on file   Highest education level: 12th grade  Occupational History   Occupation: Full time   Occupation: Naval architect  Tobacco Use   Smoking status: Never   Smokeless tobacco: Never  Vaping Use   Vaping status: Never Used  Substance and Sexual Activity   Alcohol use: No    Alcohol/week: 0.0 standard drinks of alcohol   Drug use: No   Sexual activity: Not on file  Other Topics Concern   Not on file  Social History Narrative   Not on file   Social Determinants of Health   Financial Resource Strain: Low Risk  (10/16/2022)   Overall Financial Resource Strain (CARDIA)    Difficulty of Paying Living Expenses: Not hard at all  Food Insecurity: No Food Insecurity (12/23/2022)   Received from [facility]   Hunger Vital Sign    Worried About Running Out of Food in the Last Year: Never true    Ran Out of Food in the Last Year: Never true  Transportation Needs: No Transportation Needs (12/23/2022)   Received from [facility] - Transportation    Lack of Transportation (  Medical): No    Lack of Transportation (Non-Medical): No  Physical Activity: Insufficiently Active (10/16/2022)   Exercise Vital Sign    Days of Exercise per Week: 2 days    Minutes of Exercise per Session: 10 min  Stress: No Stress Concern Present (12/23/2022)   Received from [facility] - Occupational Stress Questionnaire    Feeling of Stress : Not at all  Social Connections: Moderately Integrated (10/16/2022)   Social Connection and Isolation Panel [NHANES]    Frequency of Communication with Friends and Family: Once a week    Frequency of Social Gatherings with Friends and Family: Twice a week    Attends Religious Services: 1 to 4 times per year    Active Member of [employer] or Organizations: No    Attends Engineer, structural: Not on file    Marital Status: Married   Catering manager Violence: Not At Risk (12/30/2022)   Received from [facility]   HITS    Over the last 12 months how often did your partner physically hurt you?: 1    Over the last 12 months how often did your partner insult you or talk down to you?: 1    Over the last 12 months how often did your partner threaten you with physical harm?: 1    Over the last 12 months how often did your partner scream or curse at you?: 1    Family History:  Family History  Problem Relation Age of Onset   Diabetes Mother    Scleroderma Sister    Diabetes Brother    Diabetes Brother    Colon cancer Other        family history    Prostate cancer Other        family history    Lung disease Neg Hx     Medications:   Current Outpatient Medications on File Prior to Visit  Medication Sig Dispense Refill   acetaminophen (TYLENOL) 500 MG tablet Take by mouth.     albuterol (VENTOLIN HFA) 108 (90 Base) MCG/ACT inhaler Inhale 2 puffs into the lungs every 6 (six) hours as needed. 18 g 5   calcium carbonate (SUPER CALCIUM) 1500 (600 Ca) MG TABS tablet Take 1 tablet by mouth daily.     calcium-vitamin D (OSCAL WITH D) 500-5 MG-MCG tablet Take 1 tablet by mouth.     Cinnamon 500 MG capsule Take 1,000 mg by mouth daily.      diclofenac (VOLTAREN) 75 MG EC tablet Take 1 tablet (75 mg total) by mouth 2 (two) times daily as needed. Use only if needed. 180 tablet 1   gabapentin (NEURONTIN) 300 MG capsule Take 1 capsule (300 mg total) by mouth 3 (three) times daily. 270 capsule 3   magnesium oxide (MAG-OX) 400 MG tablet Take 400 mg by mouth daily.     meloxicam (MOBIC) 7.5 MG tablet Take by mouth.     Multiple Vitamin (MULTIVITAMIN) tablet Take 1 tablet by mouth daily.     Omega-3 Fatty Acids (FISH OIL) 1000 MG CAPS Take 1 capsule by mouth daily.      omeprazole (PRILOSEC) 20 MG capsule TAKE 1 CAPSULE BY MOUTH TWICE DAILY BEFORE MEAL(S) 60 capsule 5   PARoxetine (PAXIL-CR) 25 MG 24 hr tablet Take 1 tablet by mouth  once daily 90 tablet 0   Potassium 99 MG TABS Take 1 tablet by mouth daily.     pravastatin (PRAVACHOL) 40 MG tablet  TAKE 1 TABLET BY MOUTH AT BEDTIME 30 tablet 5   telmisartan (MICARDIS) 40 MG tablet Take 0.5 tablets (20 mg total) by mouth daily. 90 tablet 0   triamcinolone cream (KENALOG) 0.1 % Apply topically 2 (two) times daily.     triamterene-hydrochlorothiazide (MAXZIDE-25) 37.5-25 MG tablet Take 1 tablet by mouth once daily 30 tablet 5   TURMERIC PO Take 1 capsule by mouth daily.      Vitamin D, Ergocalciferol, (DRISDOL) 1.25 MG (50000 UNIT) CAPS capsule Take 1 capsule (50,000 Units total) by mouth every 7 (seven) days. 4 capsule 0   No current facility-administered medications on file prior to visit.    Allergies:   Allergies  Allergen Reactions   Other Anaphylaxis    Make me hurt alot   Codeine     Unknown    Lovastatin     Makes me hurt alot   Niaspan [Niacin Er]     Make me hurt alot      OBJECTIVE:  Physical Exam  There were no vitals filed for this visit. There is no height or weight on file to calculate BMI. No results found.   General: well developed, well nourished, seated, in no evident distress Head: head normocephalic and atraumatic.   Neck: supple with no carotid or supraclavicular bruits Cardiovascular: regular rate and rhythm, no murmurs Musculoskeletal: no deformity Skin:  no rash/petichiae Vascular:  Normal pulses all extremities   Neurologic Exam Mental Status: Awake and fully alert. Oriented to place and time. Recent and remote memory intact. Attention span, concentration and fund of knowledge appropriate. Mood and affect appropriate.  Cranial Nerves: Pupils equal, briskly reactive to light. Extraocular movements full without nystagmus. Visual fields full to confrontation. Hearing intact. Facial sensation intact. Face, tongue, palate moves normally and symmetrically.  Motor: Normal bulk and tone. Normal strength in all tested extremity  muscles Sensory.: intact to touch , pinprick , position and vibratory sensation.  Coordination: Rapid alternating movements normal in all extremities. Finger-to-nose and heel-to-shin performed accurately bilaterally. Gait and Station: Arises from chair without difficulty. Stance is normal. Gait demonstrates normal stride length and balance without use of AD. Tandem walk and heel toe without difficulty.  Reflexes: 1+ and symmetric. Toes downgoing.         ASSESSMENT/PLAN: Jacob Cline is a 62 y.o. year old male    OSA on CPAP : Compliance report shows satisfactory usage with optimal residual AHI.  Discussed continued nightly usage with ensuring greater than 4 hours nightly for optimal benefit and per insurance purposes.  Continue to follow with DME company for any needed supplies or CPAP related concerns     Follow up in *** or call earlier if needed   CC:  PCP: [name], DO    I spent *** minutes of face-to-face and non-face-to-face time with patient.  This included previsit chart review, lab review, study review, order entry, electronic health record documentation, patient education and discussion regarding above diagnoses and treatment plan and answered all other questions to patient's satisfaction    Ihor Austin, [facility]  [facility] [address] [place], [place] [address]  Phone [phone] Fax [phone] Note: This document was prepared with digital dictation and possible smart phrase technology. Any transcriptional errors that result from this process are unintentional.

## 2023-02-16 ENCOUNTER — Encounter: Payer: Self-pay | Admitting: Adult Health

## 2023-02-16 ENCOUNTER — Ambulatory Visit (INDEPENDENT_AMBULATORY_CARE_PROVIDER_SITE_OTHER): Payer: 59 | Admitting: Adult Health

## 2023-02-16 VITALS — BP 114/69 | HR 66 | Ht 70.0 in | Wt 234.0 lb

## 2023-02-16 DIAGNOSIS — G4733 Obstructive sleep apnea (adult) (pediatric): Secondary | ICD-10-CM | POA: Diagnosis not present

## 2023-02-16 NOTE — Patient Instructions (Signed)
Your Plan:  Continue nightly use of CPAP for adequate sleep apnea management  Continue to follow-up with your DME [employer] for any needs supplies or CPAP related concerns    Follow-up in 1 year or call earlier if needed     Thank you for coming to see [place] at [facility]. I hope we have been able to provide you high quality care today.  You may receive a patient satisfaction survey over the next few weeks. We would appreciate your feedback and comments so that we may continue to improve ourselves and the health of our patients.

## 2023-02-21 ENCOUNTER — Telehealth: Payer: Self-pay | Admitting: Family Medicine

## 2023-02-21 NOTE — Telephone Encounter (Signed)
FMLA paperwork received by fax 10/15.  Patient aware and thinks paperwork should have gone to Careers adviser.  He will call and let us know what to do. Paperwork in [employer].

## 2023-03-13 ENCOUNTER — Encounter: Payer: Self-pay | Admitting: Internal Medicine

## 2023-03-13 ENCOUNTER — Ambulatory Visit (INDEPENDENT_AMBULATORY_CARE_PROVIDER_SITE_OTHER): Payer: 59 | Admitting: Internal Medicine

## 2023-03-13 VITALS — BP 120/78 | HR 66 | Ht 69.0 in | Wt 233.0 lb

## 2023-03-13 DIAGNOSIS — G4733 Obstructive sleep apnea (adult) (pediatric): Secondary | ICD-10-CM | POA: Diagnosis not present

## 2023-03-13 DIAGNOSIS — R0602 Shortness of breath: Secondary | ICD-10-CM

## 2023-03-13 DIAGNOSIS — Z23 Encounter for immunization: Secondary | ICD-10-CM | POA: Diagnosis not present

## 2023-03-13 NOTE — Addendum Note (Signed)
Addended by: Lanna Poche on: 03/13/2023 11:36 AM   Modules accepted: Orders

## 2023-03-13 NOTE — Patient Instructions (Addendum)
It was a pleasure to see you today!  Please schedule follow up scheduled with myself in 1 year.  If my schedule is not open yet, we will contact you with a reminder closer to that time. Please call [phone] if you haven't heard from [place] a month before, and always call us sooner if issues or concerns arise. You can also send [place] a message through MyChart, but but aware that this is not to be used for urgent issues and it may take up to 5-7 days to receive a reply. Please be aware that you will likely be able to view your results before I have a chance to respond to them. Please give [place] 5 business days to respond to any non-urgent results.   I am glad your breathing is doing well.  Good luck with knee surgery. Make sure you use the incentive spirometry machine to help prevent pneumonia and keep oxygen levels up.   Continue the albuterol inhaler as needed.   Keep up the CPAP.

## 2023-03-13 NOTE — Progress Notes (Signed)
[name]    [id number]    1960/12/15  Primary Care Physician:Gottschalk, Rozell Searing, DO Date of Appointment: 03/13/2023 Established Patient Visit  Chief complaint:   Chief Complaint  Patient presents with   Follow-up    Breathing has been good      HPI: Jacob Cline is a 62 y.o. man with history of OSA on CPAP with ongoing shortness of breath.  Interval Updates: Here for follow up.   Has albuterol prn, minimal use.   Having issues with mobility due to hip and knee OA. Had replacement of left hip. Had some breathing issues post-operative, felt his oxygen levels were in the high 80s at home. Used IS and this helped.   Went to [place] this past year and had an enjoyable trip. Minimal dyspnea    Following up on cpap with Dr. Golden Hurter. Doing well. Now on [facility] nocturnally.   Still doing shift work with truck driving.   I have reviewed the patient's family social and past medical history and updated as appropriate.   Past Medical History:  Diagnosis Date   Aortic aneurysm (HCC)    Back pain    BPH (benign prostatic hypertrophy)    Cyst of left kidney    Edema of both lower extremities    GERD (gastroesophageal reflux disease)    Hemorrhoids    Hiatal hernia    Hyperlipidemia    Hypertension    Irregular heart beat    Joint pain    Obesity    Post-operative nausea and vomiting    Vitamin D deficiency     Past Surgical History:  Procedure Laterality Date   athroscopy of rt knee  02/02   henia - umbilical surgery  07/17/07   HERNIA REPAIR  05/15/2017   double hernia surgery   HIP SURGERY  11/2014   right hip   MOUTH SURGERY     VASECTOMY      Family History  Problem Relation Age of Onset   Diabetes Mother    Scleroderma Sister    Diabetes Brother    Diabetes Brother    Colon cancer Other        family history    Prostate cancer Other        family history    Lung disease Neg Hx     Social History   Occupational History    Occupation: Full time   Occupation: Naval architect  Tobacco Use   Smoking status: Never   Smokeless tobacco: Never  Vaping Use   Vaping status: Never Used  Substance and Sexual Activity   Alcohol use: No    Alcohol/week: 0.0 standard drinks of alcohol   Drug use: No   Sexual activity: Not on file     Physical Exam: Blood pressure 120/78, pulse 66, height 5\' 9"  (1.753 m), weight 233 lb (105.7 kg), SpO2 97%.  Gen:      No acute distress Lungs:    ctab no wheezes or crackles CV:         RRR no mrg Abd: truncal obesity Ext: no edema  Data Reviewed: Imaging: I have personally reviewed the CT Angio chest with small pulmonary nodule  PFTs:     Latest Ref Rng & Units 10/29/2021    1:35 PM  PFT Results  FVC-Pre L 3.02   FVC-Predicted Pre % 64   FVC-Post L 3.06   FVC-Predicted Post % 65   Pre FEV1/FVC % %  86   Post FEV1/FCV % % 87   FEV1-Pre L 2.61   FEV1-Predicted Pre % 74   FEV1-Post L 2.66   DLCO uncorrected ml/min/mmHg 22.71   DLCO UNC% % 83   DLCO corrected ml/min/mmHg 22.71   DLCO COR %Predicted % 83   DLVA Predicted % 120   TLC L 5.04   TLC % Predicted % 71   RV % Predicted % 72    I have personally reviewed the patient's PFTs and mild restriction to ventilation secondary to body habitus   Labs: Lab Results  Component Value Date   WBC 8.7 10/21/2022   HGB 14.0 10/21/2022   HCT 41.9 10/21/2022   MCV 94 10/21/2022   PLT 181 10/21/2022   Lab Results  Component Value Date   NA 139 10/19/2022   K 4.2 10/19/2022   CL 99 10/19/2022   CO2 27 10/19/2022     Immunization status: Immunization History  Administered Date(s) Administered   Influenza, Seasonal, Injecte, Preservative Fre 01/25/2021   Influenza,inj,Quad PF,6+ Mos 02/11/2019, 02/10/2020, 01/25/2021, 02/25/2022   Influenza-Unspecified 02/11/2019, 02/10/2020, 01/25/2021   Moderna Sars-Covid-2 Vaccination 08/12/2019, 09/09/2019   PNEUMOCOCCAL CONJUGATE-20 02/25/2022   Td 05/10/2003, 01/20/2017    Td (Adult), 2 Lf Tetanus Toxid, Preservative Free 05/10/2003, 01/20/2017   Zoster Recombinant(Shingrix) 01/12/2018, 08/13/2018    External Records Personally Reviewed: pulmonary, family medicine, neurology  Assessment:  Shortness of breath, likely deconditioning related Passive smoke exposure OSA on CPAP and 2LNC Obesity Body mass index is 34.41 kg/m. Need for influenza vaccination  Plan/Recommendations: I am glad your breathing is doing well.  Good luck with knee surgery. Make sure you use the incentive spirometry machine to help prevent pneumonia and keep oxygen levels up.   Continue the albuterol inhaler as needed.   Keep using CPAP.   Flu shot today.    Return to Care: Return in about 1 year (around 03/12/2024).   Durel Salts, MD Pulmonary and Critical Care Medicine [facility] Office:[phone]

## 2023-03-14 ENCOUNTER — Other Ambulatory Visit: Payer: Self-pay | Admitting: Family Medicine

## 2023-03-14 DIAGNOSIS — I10 Essential (primary) hypertension: Secondary | ICD-10-CM

## 2023-04-10 ENCOUNTER — Other Ambulatory Visit: Payer: 59

## 2023-04-10 ENCOUNTER — Ambulatory Visit (INDEPENDENT_AMBULATORY_CARE_PROVIDER_SITE_OTHER): Payer: 59 | Admitting: Family Medicine

## 2023-04-10 ENCOUNTER — Encounter: Payer: Self-pay | Admitting: Family Medicine

## 2023-04-10 VITALS — BP 100/63 | HR 67 | Temp 98.7°F | Ht 69.0 in | Wt 235.0 lb

## 2023-04-10 DIAGNOSIS — R7303 Prediabetes: Secondary | ICD-10-CM

## 2023-04-10 DIAGNOSIS — E7800 Pure hypercholesterolemia, unspecified: Secondary | ICD-10-CM

## 2023-04-10 LAB — BAYER DCA HB A1C WAIVED: HB A1C (BAYER DCA - WAIVED): 6.2 % — ABNORMAL HIGH (ref 4.8–5.6)

## 2023-04-10 NOTE — Progress Notes (Signed)
Subjective: CC:HLD/ PREdm PCP: [name], DO ZOX:WRUEA H Fenger is a 62 y.o. male presenting to clinic today for:  1.  Hyperlipidemia prediabetes associated with obesity Patient has undergone hip surgery since last visit.  He will be undergoing right knee surgery in a couple of weeks.  He admits that he has not been as physically active as he had been due to arthritic issues.  He has cut back on bread intake and drinks only diet sodas now.  Reports no chest pain, shortness of breath, polydipsia or polyuria.  Compliant with statin, blood pressure medications   ROS: Per HPI  Allergies  Allergen Reactions   Other Anaphylaxis    Make me hurt alot   Codeine     Unknown    Lovastatin     Makes me hurt alot   Niaspan [Niacin Er (Antihyperlipidemic)]     Make me hurt alot   Past Medical History:  Diagnosis Date   Aortic aneurysm (HCC)    Back pain    BPH (benign prostatic hypertrophy)    Cyst of left kidney    Edema of both lower extremities    GERD (gastroesophageal reflux disease)    Hemorrhoids    Hiatal hernia    Hyperlipidemia    Hypertension    Irregular heart beat    Joint pain    Obesity    Post-operative nausea and vomiting    Vitamin D deficiency     Current Outpatient Medications:    acetaminophen (TYLENOL) 500 MG tablet, Take by mouth., Disp: , Rfl:    albuterol (VENTOLIN HFA) 108 (90 Base) MCG/ACT inhaler, Inhale 2 puffs into the lungs every 6 (six) hours as needed., Disp: 18 g, Rfl: 5   Cinnamon 500 MG capsule, Take 1,000 mg by mouth daily. , Disp: , Rfl:    diclofenac (VOLTAREN) 75 MG EC tablet, Take 1 tablet (75 mg total) by mouth 2 (two) times daily as needed. Use only if needed., Disp: 180 tablet, Rfl: 1   Multiple Vitamin (MULTIVITAMIN) tablet, Take 1 tablet by mouth daily., Disp: , Rfl:    Omega-3 Fatty Acids (FISH OIL) 1000 MG CAPS, Take 1 capsule by mouth daily. , Disp: , Rfl:    omeprazole (PRILOSEC) 20 MG capsule, TAKE 1 CAPSULE BY MOUTH  TWICE DAILY BEFORE MEAL(S), Disp: 60 capsule, Rfl: 5   PARoxetine (PAXIL-CR) 25 MG 24 hr tablet, Take 1 tablet by mouth once daily, Disp: 90 tablet, Rfl: 0   pravastatin (PRAVACHOL) 40 MG tablet, TAKE 1 TABLET BY MOUTH AT BEDTIME, Disp: 30 tablet, Rfl: 5   telmisartan (MICARDIS) 40 MG tablet, Take 1 tablet by mouth once daily, Disp: 90 tablet, Rfl: 0   triamterene-hydrochlorothiazide (MAXZIDE-25) 37.5-25 MG tablet, Take 1 tablet by mouth once daily, Disp: 30 tablet, Rfl: 5   TURMERIC PO, Take 1 capsule by mouth daily. , Disp: , Rfl:    Vitamin D, Ergocalciferol, (DRISDOL) 1.25 MG (50000 UNIT) CAPS capsule, Take 1 capsule (50,000 Units total) by mouth every 7 (seven) days., Disp: 4 capsule, Rfl: 0 Social History   Socioeconomic History   Marital status: Married    Spouse name: Not on file   Number of children: Not on file   Years of education: Not on file   Highest education level: 12th grade  Occupational History   Occupation: Full time   Occupation: Naval architect  Tobacco Use   Smoking status: Never   Smokeless tobacco: Never  Advertising account planner  Vaping status: Never Used  Substance and Sexual Activity   Alcohol use: No    Alcohol/week: 0.0 standard drinks of alcohol   Drug use: No   Sexual activity: Not on file  Other Topics Concern   Not on file  Social History Narrative   Not on file   Social Determinants of Health   Financial Resource Strain: Low Risk  (04/08/2023)   Overall Financial Resource Strain (CARDIA)    Difficulty of Paying Living Expenses: Not hard at all  Food Insecurity: No Food Insecurity (04/08/2023)   Hunger Vital Sign    Worried About Running Out of Food in the Last Year: Never true    Ran Out of Food in the Last Year: Never true  Transportation Needs: No Transportation Needs (04/08/2023)   PRAPARE - Administrator, Civil Service (Medical): No    Lack of Transportation (Non-Medical): No  Physical Activity: Inactive (04/08/2023)   Exercise Vital  Sign    Days of Exercise per Week: 0 days    Minutes of Exercise per Session: 10 min  Stress: No Stress Concern Present (04/08/2023)   [employer] of Occupational Health - Occupational Stress Questionnaire    Feeling of Stress : Not at all  Social Connections: Moderately Integrated (04/08/2023)   Social Connection and Isolation Panel [NHANES]    Frequency of Communication with Friends and Family: Three times a week    Frequency of Social Gatherings with Friends and Family: Three times a week    Attends Religious Services: 1 to 4 times per year    Active Member of Clubs or Organizations: No    Attends Banker Meetings: Not on file    Marital Status: Married  Intimate Partner Violence: Not At Risk (03/31/2023)   Received from [facility]   HITS    Over the last 12 months how often did your partner physically hurt you?: Never    Over the last 12 months how often did your partner insult you or talk down to you?: Never    Over the last 12 months how often did your partner threaten you with physical harm?: Never    Over the last 12 months how often did your partner scream or curse at you?: Never   Family History  Problem Relation Age of Onset   Diabetes Mother    Scleroderma Sister    Diabetes Brother    Diabetes Brother    Colon cancer Other        family history    Prostate cancer Other        family history    Lung disease Neg Hx     Objective: Office vital signs reviewed. BP 100/63   Pulse 67   Temp 98.7 F (37.1 C)   Ht 5\' 9"  (1.753 m)   Wt 235 lb (106.6 kg)   SpO2 97%   BMI 34.70 kg/m   Physical Examination:  General: Awake, alert, obese, No acute distress HEENT: sclera white, MMM Cardio: regular rate and rhythm, S1S2 heard, no murmurs appreciated Pulm: clear to auscultation bilaterally, no wheezes, rhonchi or rales; normal work of breathing on room air  Assessment/ Plan: 62 y.o. male   Prediabetes  Morbid obesity (HCC) - Plan: Bayer DCA  Hb A1c Waived  Pure hypercholesterolemia - Plan: CMP14+EGFR, Lipid panel  A1c worsening at 6.2 today. Reviewed need for carb modification/ exercise to reduce sugar.  Handout provided.  I will see him back in 4 m to  reassess.  FLP and CMP collected. Continue current meds for now   [name], DO [facility] [phone]

## 2023-04-11 LAB — LIPID PANEL
Chol/HDL Ratio: 3.5 {ratio} (ref 0.0–5.0)
Cholesterol, Total: 158 mg/dL (ref 100–199)
HDL: 45 mg/dL (ref >39)
LDL Chol Calc (NIH): 97 mg/dL (ref 0–99)
Triglycerides: 86 mg/dL (ref 0–149)
VLDL Cholesterol Cal: 16 mg/dL (ref 5–40)

## 2023-04-11 LAB — CMP14+EGFR
ALT: 30 [IU]/L (ref 0–44)
AST: 19 [IU]/L (ref 0–40)
Albumin: 4.2 g/dL (ref 3.9–4.9)
Alkaline Phosphatase: 122 [IU]/L — ABNORMAL HIGH (ref 44–121)
BUN/Creatinine Ratio: 40 — ABNORMAL HIGH (ref 10–24)
BUN: 32 mg/dL — ABNORMAL HIGH (ref 8–27)
Bilirubin Total: 0.2 mg/dL (ref 0.0–1.2)
CO2: 26 mmol/L (ref 20–29)
Calcium: 9.4 mg/dL (ref 8.6–10.2)
Chloride: 103 mmol/L (ref 96–106)
Creatinine, Ser: 0.81 mg/dL (ref 0.76–1.27)
Globulin, Total: 2.6 g/dL (ref 1.5–4.5)
Glucose: 94 mg/dL (ref 70–99)
Potassium: 4.8 mmol/L (ref 3.5–5.2)
Sodium: 145 mmol/L — ABNORMAL HIGH (ref 134–144)
Total Protein: 6.8 g/dL (ref 6.0–8.5)
eGFR: 98 mL/min/{1.73_m2} (ref >59)

## 2023-05-02 ENCOUNTER — Ambulatory Visit: Payer: Self-pay | Admitting: Family Medicine

## 2023-05-02 ENCOUNTER — Ambulatory Visit: Payer: 59 | Admitting: Family Medicine

## 2023-05-02 NOTE — Telephone Encounter (Signed)
Chief Complaint: congestion, cough Symptoms: productive cough with dark green-grayish sputum, head congestion, chest congestion, wheezing "sometimes" at night  Frequency: x 2-3 days  Pertinent Negatives: Patient denies fever, chest pain, SOB, runny nose, headaches, nausea or vomiting  Disposition: [] ED /[] Urgent Care (no appt availability in office) / [x] Appointment(In office/virtual)/ []  [facility]/ [] Home Care/ [] Refused Recommended Disposition /[] [facility]/ []  Follow-up with PCP Additional Notes: Pt c/o head congestion that has led to chest congestion and cough x 2-3 days. Pt states his 33 month old grandson was diagnosed a week ago with RSV and he was exposed to him. Pt had knee replacement surgery 2 weeks ago and states he would not like to have to go in person for OV. Pt agreeable to virtual visit. Pt verbalizes understanding of home care advice and concerning symptoms to call back for.  Copied from CRM [id number]. Topic: Clinical - Medical Advice >> May 02, 2023  9:18 AM Fuller Mandril wrote: Reason for CRM: Pt called in stated his grandchild was diagnosed with RSV and he think he may have it and wants to know what the provider would suggest without him coming in due to he is recovering from knee replacement. He has symptoms of head congesting and cough. No fever that he is aware of. Thank You Reason for Disposition  Wheezing is present  Answer Assessment - Initial Assessment Questions 1. ONSET: "When did the cough begin?"      X 2- 3 days  2. SEVERITY: "How bad is the cough today?"      Moderate; worse first thing in the morning.  3. SPUTUM: "Describe the color of your sputum" (none, dry cough; clear, white, yellow, green)     Dark green-grayish.  4. HEMOPTYSIS: "Are you coughing up any blood?" If so ask: "How much?" (flecks, streaks, tablespoons, etc.)     Denies blood.  5. DIFFICULTY BREATHING: "Are you having difficulty breathing?" If Yes, ask: "How bad  is it?" (e.g., mild, moderate, severe)    - MILD: No SOB at rest, mild SOB with walking, speaks normally in sentences, can lie down, no retractions, pulse < 100.    - MODERATE: SOB at rest, SOB with minimal exertion and prefers to sit, cannot lie down flat, speaks in phrases, mild retractions, audible wheezing, pulse 100-120.    - SEVERE: Very SOB at rest, speaks in single words, struggling to breathe, sitting hunched forward, retractions, pulse > 120      Denies SOB.  6. FEVER: "Do you have a fever?" If Yes, ask: "What is your temperature, how was it measured, and when did it start?"     Denies fever.  7. CARDIAC HISTORY: "Do you have any history of heart disease?" (e.g., heart attack, congestive heart failure)      Denies cardiac history. Pt states he has high blood pressure and high cholesterol.  8. LUNG HISTORY: "Do you have any history of lung disease?"  (e.g., pulmonary embolus, asthma, emphysema)     Sleep apnea, sleeps with CPAP.   9. PE RISK FACTORS: "Do you have a history of blood clots?" (or: recent major surgery, recent prolonged travel, bedridden)     Knee replacement surgery 2 weeks ago. Denies history of blood clots; pt states he takes Xarelto due to family history of blood clots.  10. OTHER SYMPTOMS: "Do you have any other symptoms?" (e.g., runny nose, wheezing, chest pain)       Wheezing "sometimes", congested/stuffy nose.  11. TRAVEL: "Have  you traveled out of the country in the last month?" (e.g., travel history, exposures)       Grandchild diagnosed with RSV and recent exposure this past week.  Protocols used: Cough - Acute Productive-A-AH

## 2023-05-14 ENCOUNTER — Other Ambulatory Visit: Payer: Self-pay | Admitting: Family Medicine

## 2023-05-14 DIAGNOSIS — F411 Generalized anxiety disorder: Secondary | ICD-10-CM

## 2023-05-14 DIAGNOSIS — F339 Major depressive disorder, recurrent, unspecified: Secondary | ICD-10-CM

## 2023-05-23 ENCOUNTER — Encounter: Payer: Self-pay | Admitting: Family

## 2023-05-23 ENCOUNTER — Ambulatory Visit (INDEPENDENT_AMBULATORY_CARE_PROVIDER_SITE_OTHER): Payer: 59 | Admitting: Family

## 2023-05-23 VITALS — BP 103/65 | HR 78 | Temp 98.0°F | Ht 69.0 in | Wt 240.0 lb

## 2023-05-23 DIAGNOSIS — B3789 Other sites of candidiasis: Secondary | ICD-10-CM | POA: Diagnosis not present

## 2023-05-23 MED ORDER — NYSTATIN 100000 UNIT/GM EX POWD: 1.0000 | Freq: Three times a day (TID) | CUTANEOUS | 2 refills | Status: DC

## 2023-05-23 MED ORDER — NYSTATIN 100000 UNIT/GM EX CREA: 1.0000 | TOPICAL_CREAM | Freq: Two times a day (BID) | CUTANEOUS | 2 refills | Status: DC

## 2023-05-23 NOTE — Patient Instructions (Signed)
Skin Yeast Infection  A skin yeast infection is a condition in which there is an overgrowth of yeast (Candida) that normally lives on the skin. This condition usually occurs in areas of the skin that are constantly warm and moist, such as the skin under the breasts or armpits, or in the groin and other body folds. What are the causes? This condition is caused by a change in the normal balance of the yeast that live on the skin. What increases the risk? You are more likely to develop this condition if you: Are obese. Are pregnant. Are 44 years of age or older. Wear tight clothing. Have any of the following conditions: Diabetes. Malnutrition. A weak body defense system (immune system). Take medicines such as: Birth control pills. Antibiotics. Steroid medicines. What are the signs or symptoms? The most common symptom of this condition is itchiness in the affected area. Other symptoms include: A red, swollen area of the skin. Bumps on the skin. How is this diagnosed? This condition is diagnosed with a medical history and physical exam. Your health care provider may check for yeast by taking scrapings of the skin to be viewed under a microscope. How is this treated? This condition is treated with medicine. Medicines may be prescribed or available over the counter. The medicines may be: Taken by mouth (orally). Applied as a cream or powder to your skin. Follow these instructions at home:  Take or apply over-the-counter and prescription medicines only as told by your health care provider. Maintain a healthy weight. If you need help losing weight, talk with your health care provider. Keep your skin clean and dry. Wear loose-fitting clothing. If you have diabetes, keep your blood sugar under control. Keep all follow-up visits. This is important. Contact a health care provider if: Your symptoms go away and then come back. Your symptoms do not get better with treatment. Your symptoms get  worse. Your rash spreads. You have a fever or chills. You have new symptoms. You have new warmth or redness of your skin. Your rash is painful or bleeding. Summary A skin yeast infection is a condition in which there is an overgrowth of yeast (Candida) that normally lives on the skin. Take or apply over-the-counter and prescription medicines only as told by your health care provider. Keep your skin clean and dry. Contact a health care provider if your symptoms do not get better with treatment. This information is not intended to replace advice given to you by your health care provider. Make sure you discuss any questions you have with your health care provider. Document Revised: 07/14/2020 Document Reviewed: 07/14/2020 Elsevier Patient Education  2024 [employer].

## 2023-05-23 NOTE — Progress Notes (Signed)
 Subjective:    Patient ID: Jacob Cline, male    DOB: December 02, 1960, 63 y.o.   MRN: [id number]  Chief Complaint  Patient presents with   Rash    Groin area x 3 days itching and irritated    PT presents to the office today with a rash in left groin that started 3 days ago.  Rash This is a new problem. The current episode started in the past 7 days. The problem has been gradually worsening since onset. The affected locations include the groin. The rash is characterized by redness, itchiness, burning and blistering. Pertinent negatives include no congestion, cough, diarrhea, eye pain, fatigue, fever, joint pain or nail changes. Past treatments include antibiotic cream. The treatment provided mild relief.      Review of Systems  Constitutional:  Negative for fatigue and fever.  HENT:  Negative for congestion.   Eyes:  Negative for pain.  Respiratory:  Negative for cough.   Gastrointestinal:  Negative for diarrhea.  Musculoskeletal:  Negative for joint pain.  Skin:  Positive for rash. Negative for nail changes.  All other systems reviewed and are negative.      Objective:   Physical Exam Vitals reviewed.  Constitutional:      General: He is not in acute distress.    Appearance: He is well-developed.  HENT:     Head: Normocephalic.  Eyes:     General:        Right eye: No discharge.        Left eye: No discharge.     Pupils: Pupils are equal, round, and reactive to light.  Neck:     Thyroid : No thyromegaly.  Cardiovascular:     Rate and Rhythm: Normal rate and regular rhythm.     Heart sounds: Normal heart sounds. No murmur heard. Pulmonary:     Effort: Pulmonary effort is normal. No respiratory distress.     Breath sounds: Normal breath sounds. No wheezing.  Abdominal:     General: Bowel sounds are normal. There is no distension.     Palpations: Abdomen is soft.     Tenderness: There is no abdominal tenderness.  Musculoskeletal:        General: No tenderness. Normal  range of motion.     Cervical back: Normal range of motion and neck supple.  Skin:    General: Skin is warm and dry.     Findings: Erythema and rash present.          Comments: Erythemas rash in left groin  Neurological:     Mental Status: He is alert and oriented to person, place, and time.     Cranial Nerves: No cranial nerve deficit.     Deep Tendon Reflexes: Reflexes are normal and symmetric.  Psychiatric:        Behavior: Behavior normal.        Thought Content: Thought content normal.        Judgment: Judgment normal.       BP 103/65   Pulse 78   Temp 98 F (36.7 C) (Temporal)   Ht 5' 9 (1.753 m)   Wt 240 lb (108.9 kg)   SpO2 97%   BMI 35.44 kg/m      Assessment & Plan:  Jacob Cline comes in today with chief complaint of Rash (Groin area x 3 days itching and irritated )   Diagnosis and orders addressed:  1. Candida rash of groin (Primary) Keep clean and dry Nystatin   cream and powder Wear loose fitting clothing Follow up if symptoms worsen or do not improve  - nystatin  cream (MYCOSTATIN ); Apply 1 Application topically 2 (two) times daily.  Dispense: 60 g; Refill: 2 - nystatin  (MYCOSTATIN /NYSTOP ) powder; Apply 1 Application topically 3 (three) times daily.  Dispense: 60 g; Refill: 2   Bari Learn, FNP

## 2023-06-07 ENCOUNTER — Other Ambulatory Visit: Payer: Self-pay | Admitting: Family Medicine

## 2023-06-07 DIAGNOSIS — I10 Essential (primary) hypertension: Secondary | ICD-10-CM

## 2023-06-25 ENCOUNTER — Other Ambulatory Visit: Payer: Self-pay | Admitting: Family Medicine

## 2023-06-25 DIAGNOSIS — E7800 Pure hypercholesterolemia, unspecified: Secondary | ICD-10-CM

## 2023-06-27 ENCOUNTER — Other Ambulatory Visit: Payer: Self-pay | Admitting: Family Medicine

## 2023-06-27 DIAGNOSIS — E7800 Pure hypercholesterolemia, unspecified: Secondary | ICD-10-CM

## 2023-07-24 ENCOUNTER — Other Ambulatory Visit: Payer: Self-pay | Admitting: Family Medicine

## 2023-07-24 ENCOUNTER — Other Ambulatory Visit

## 2023-07-24 ENCOUNTER — Ambulatory Visit: Payer: 59 | Admitting: Family Medicine

## 2023-07-24 DIAGNOSIS — R7303 Prediabetes: Secondary | ICD-10-CM

## 2023-07-24 DIAGNOSIS — I10 Essential (primary) hypertension: Secondary | ICD-10-CM

## 2023-07-24 DIAGNOSIS — E7800 Pure hypercholesterolemia, unspecified: Secondary | ICD-10-CM

## 2023-07-24 LAB — BAYER DCA HB A1C WAIVED: HB A1C (BAYER DCA - WAIVED): 5.7 % — ABNORMAL HIGH (ref 4.8–5.6)

## 2023-07-24 NOTE — Telephone Encounter (Signed)
 Copied from CRM [id number]. Topic: Clinical - Medication Refill >> Jul 24, 2023 10:38 AM Jacob Cline wrote: Most Recent Primary Care Visit:   Medication: Celebrex 100mg   Has the patient contacted their pharmacy? No (Agent: If no, request that the patient contact the pharmacy for the refill. If patient does not wish to contact the pharmacy document the reason why and proceed with request.) (Agent: If yes, when and what did the pharmacy advise?)  Is this the correct pharmacy for this prescription? Yes If no, delete pharmacy and type the correct one.  This is the patient's preferred pharmacy:  [facility] [address] [place] - [address] [place] [address] [place] [address] Phone: [phone] Fax: [phone]   Has the prescription been filled recently? No  Is the patient out of the medication? Yes  Has the patient been seen for an appointment in the last year OR does the patient have an upcoming appointment? No  Can we respond through MyChart? No  Agent: Please be advised that Rx refills may take up to 3 business days. We ask that you follow-up with your pharmacy.

## 2023-07-25 ENCOUNTER — Encounter: Payer: Self-pay | Admitting: Family Medicine

## 2023-07-25 LAB — LIPID PANEL
Chol/HDL Ratio: 2.7 ratio (ref 0.0–5.0)
Cholesterol, Total: 118 mg/dL (ref 100–199)
HDL: 44 mg/dL (ref >39)
LDL Chol Calc (NIH): 56 mg/dL (ref 0–99)
Triglycerides: 93 mg/dL (ref 0–149)
VLDL Cholesterol Cal: 18 mg/dL (ref 5–40)

## 2023-07-25 LAB — CMP14+EGFR
ALT: 20 IU/L (ref 0–44)
AST: 16 IU/L (ref 0–40)
Albumin: 4.1 g/dL (ref 3.9–4.9)
Alkaline Phosphatase: 108 IU/L (ref 44–121)
BUN/Creatinine Ratio: 31 — ABNORMAL HIGH (ref 10–24)
BUN: 22 mg/dL (ref 8–27)
Bilirubin Total: 0.3 mg/dL (ref 0.0–1.2)
CO2: 25 mmol/L (ref 20–29)
Calcium: 9.2 mg/dL (ref 8.6–10.2)
Chloride: 101 mmol/L (ref 96–106)
Creatinine, Ser: 0.71 mg/dL — ABNORMAL LOW (ref 0.76–1.27)
Globulin, Total: 2.5 g/dL (ref 1.5–4.5)
Glucose: 102 mg/dL — ABNORMAL HIGH (ref 70–99)
Potassium: 4.1 mmol/L (ref 3.5–5.2)
Sodium: 141 mmol/L (ref 134–144)
Total Protein: 6.6 g/dL (ref 6.0–8.5)
eGFR: 102 mL/min/{1.73_m2} (ref >59)

## 2023-07-26 ENCOUNTER — Telehealth: Payer: Self-pay

## 2023-07-26 ENCOUNTER — Encounter: Payer: Self-pay | Admitting: Family Medicine

## 2023-07-26 ENCOUNTER — Telehealth: Payer: Self-pay | Admitting: Family Medicine

## 2023-07-26 ENCOUNTER — Other Ambulatory Visit: Payer: Self-pay | Admitting: Family Medicine

## 2023-07-26 MED ORDER — CELECOXIB 100 MG PO CAPS: 100.0000 mg | ORAL_CAPSULE | Freq: Two times a day (BID) | ORAL | 3 refills | Status: AC

## 2023-07-26 NOTE — Telephone Encounter (Signed)
 Copied from CRM [id number]. Topic: Clinical - Medication Refill >> Jul 24, 2023 10:38 AM Carlatta H wrote: Most Recent Primary Care Visit:   Medication: Celebrex 100mg   Has the patient contacted their pharmacy? No (Agent: If no, request that the patient contact the pharmacy for the refill. If patient does not wish to contact the pharmacy document the reason why and proceed with request.) (Agent: If yes, when and what did the pharmacy advise?)  Is this the correct pharmacy for this prescription? Yes If no, delete pharmacy and type the correct one.  This is the patient's preferred pharmacy:  [facility] [address] [place] - [address] [place] [address] [place] [address] Phone: [phone] Fax: [phone]   Has the prescription been filled recently? No  Is the patient out of the medication? Yes  Has the patient been seen for an appointment in the last year OR does the patient have an upcoming appointment? No  Can we respond through MyChart? No  Agent: Please be advised that Rx refills may take up to 3 business days. We ask that you follow-up with your pharmacy. >> Jul 26, 2023  8:32 AM Fonda Kinder J wrote: Pt called in to see if this prescription had been sent over to the pharmacy. I advised the pt there is a pending order on the medication and it can take up to 3 business days to be processed. Pt also wanted to advise that he Is completely out of the medication

## 2023-07-26 NOTE — Telephone Encounter (Signed)
 Spoke to pt. Med sent

## 2023-07-26 NOTE — Telephone Encounter (Signed)
 Closing encounter, duplicate request, pending approval.

## 2023-07-26 NOTE — Telephone Encounter (Signed)
 Called and spoke with patient, he would like to have his celebrex filled but it was filled by ortho. Patient is requesting you take over meds, are you ok will filling this?

## 2023-07-26 NOTE — Telephone Encounter (Signed)
 Pending refill request is waiting on providers response, this is a historical medication.

## 2023-07-26 NOTE — Telephone Encounter (Signed)
 Pt already aware, in another encounter

## 2023-07-26 NOTE — Telephone Encounter (Signed)
 Copied from CRM [id number]. Topic: Clinical - Medication Refill >> Jul 26, 2023  8:32 AM Fonda Kinder J wrote: Pt called in to see if this prescription had been sent over to the pharmacy. I advised the pt there is a pending order on the medication and it can take up to 3 business days to be processed. Pt also wanted to advise that he Is completely out of the medication

## 2023-07-31 ENCOUNTER — Encounter: Payer: Self-pay | Admitting: Family Medicine

## 2023-07-31 ENCOUNTER — Ambulatory Visit (INDEPENDENT_AMBULATORY_CARE_PROVIDER_SITE_OTHER): Admitting: Family Medicine

## 2023-07-31 VITALS — BP 116/77 | HR 63 | Temp 98.7°F | Wt 248.6 lb

## 2023-07-31 DIAGNOSIS — Z6836 Body mass index (BMI) 36.0-36.9, adult: Secondary | ICD-10-CM

## 2023-07-31 DIAGNOSIS — G4733 Obstructive sleep apnea (adult) (pediatric): Secondary | ICD-10-CM

## 2023-07-31 DIAGNOSIS — L304 Erythema intertrigo: Secondary | ICD-10-CM

## 2023-07-31 DIAGNOSIS — R7303 Prediabetes: Secondary | ICD-10-CM

## 2023-07-31 DIAGNOSIS — E7800 Pure hypercholesterolemia, unspecified: Secondary | ICD-10-CM

## 2023-07-31 DIAGNOSIS — M85852 Other specified disorders of bone density and structure, left thigh: Secondary | ICD-10-CM

## 2023-07-31 MED ORDER — PRAVASTATIN SODIUM 40 MG PO TABS: 40.0000 mg | ORAL_TABLET | Freq: Every day | ORAL | 4 refills | Status: DC

## 2023-07-31 NOTE — Patient Instructions (Addendum)
 Zepbound is what we discussed for sleep apnea treatment/ weight loss  Intertrigo Intertrigo is skin irritation (inflammation) that happens in warm, moist areas of the body. The irritation can cause a rash and make skin raw and itchy. The rash is usually pink or red. It happens mostly between folds of skin or where skin rubs together, such as: In the armpits. Under the breasts. Under the belly. In the groin area. Around the butt area. Between the toes. This condition is not passed from person to person. What are the causes? Heat, moisture, rubbing, and not enough air movement. The condition can be made worse by: Sweat. Bacteria. A fungus, such as yeast. What increases the risk? Moisture in your skin folds. You are more likely to develop this condition if you: Are not able to move around. Live in a warm and moist climate. Are not able to control your pee (urine) or poop (stool). Wear splints, braces, or other medical devices. Are overweight. Have diabetes. What are the signs or symptoms? A pink or red skin rash in a skin fold or near a skin fold. Raw or scaly skin. Itching. A burning feeling. Bleeding. Leaking fluid. A bad smell. How is this treated? Cleaning and drying your skin. Taking an antibiotic medicine or using an antibiotic skin cream for a bacterial infection. Using an antifungal cream on your skin or taking pills for an infection that was caused by a fungus, such as yeast. Using a steroid ointment to stop the itching and irritation. Separating the skin fold with a clean cotton cloth to absorb moisture and allow air to flow into the area. Follow these instructions at home: Keep the affected area clean and dry. Do not scratch your skin. Stay cool as much as you can. Use an air conditioner or a fan, if you have one. Apply over-the-counter and prescription medicines only as told by your doctor. If you were prescribed antibiotics, use them as told by your doctor. Do  not stop using the antibiotic even if you start to feel better. Keep all follow-up visits. Your doctor may need to check your skin to make sure that the treatment is working. How is this prevented? Shower and dry yourself well after being active. Use a hair dryer on a cool setting to dry between skin folds. Do not wear tight clothes. Wear clothes that: Are loose. Take moisture away from your body. Are made of cotton. Wear a bra that gives good support, if needed. Protect the skin in your groin and butt area as told by your doctor. To do this: Follow a regular cleaning routine. Use creams, powders, or ointments that protect your skin. Change protection pads often. Stay at a healthy weight. Take care of your feet. This is very important if you have diabetes. You should: Wear shoes that fit well. Keep your feet dry. Wear clean cotton or wool socks. Keep your blood sugar under control if you have diabetes. Contact a doctor if: Your symptoms do not get better with treatment. Your symptoms get worse or they spread. You notice more redness and warmth. You have a fever. This information is not intended to replace advice given to you by your health care provider. Make sure you discuss any questions you have with your health care provider. Document Revised: 09/16/2021 Document Reviewed: 09/16/2021 Elsevier Patient Education  2024 [employer].

## 2023-07-31 NOTE — Progress Notes (Signed)
 Subjective: CC: Follow-up prediabetes PCP: [name], DO [name] is a 63 y.o. male presenting to clinic today for:  1.  Prediabetes associated with morbid obesity, severe obstructive sleep apnea, hyperlipidemia and hypertension Patient notes that since her last visit he is increased his protein quite a bit.  He is primarily eating things like boiled eggs, cheese wrap in meat.  He is avoiding sodas and sugary beverages.  He stays physically active at work but does not have any structured exercise regimen.  He continues to gain weight.  He will be undergoing a left knee replacement sometime in April.  He is status post bilateral hip and right knee replacement.  He worries that he may have some difficulty with his job because he is taken so many days off for multiple joint treatments.  2.  Rash He was told that he had a yeast rash in the left groin was placed on nystatin but was not told how long to use it so he is almost 4 weeks.  He still has quite a bit left.  Reports a rash on the dorsum of the right foot as well   ROS: Per HPI  Allergies  Allergen Reactions   Other Anaphylaxis    Make me hurt alot   Codeine     Unknown    Lovastatin     Makes me hurt alot   Niaspan [Niacin Er (Antihyperlipidemic)]     Make me hurt alot   Past Medical History:  Diagnosis Date   Aortic aneurysm (HCC)    Back pain    BPH (benign prostatic hypertrophy)    Cyst of left kidney    Edema of both lower extremities    GERD (gastroesophageal reflux disease)    Hemorrhoids    Hiatal hernia    Hyperlipidemia    Hypertension    Irregular heart beat    Joint pain    Obesity    Post-operative nausea and vomiting    Vitamin D deficiency     Current Outpatient Medications:    acetaminophen (TYLENOL) 500 MG tablet, Take by mouth., Disp: , Rfl:    albuterol (VENTOLIN HFA) 108 (90 Base) MCG/ACT inhaler, Inhale 2 puffs into the lungs every 6 (six) hours as needed., Disp: 18 g, Rfl:  5   celecoxib (CELEBREX) 100 MG capsule, Take 1 capsule (100 mg total) by mouth 2 (two) times daily., Disp: 180 capsule, Rfl: 3   Cinnamon 500 MG capsule, Take 1,000 mg by mouth daily. , Disp: , Rfl:    Multiple Vitamin (MULTIVITAMIN) tablet, Take 1 tablet by mouth daily., Disp: , Rfl:    nystatin (MYCOSTATIN/NYSTOP) powder, Apply 1 Application topically 3 (three) times daily., Disp: 60 g, Rfl: 2   nystatin cream (MYCOSTATIN), Apply 1 Application topically 2 (two) times daily., Disp: 60 g, Rfl: 2   Omega-3 Fatty Acids (FISH OIL) 1000 MG CAPS, Take 1 capsule by mouth daily. , Disp: , Rfl:    omeprazole (PRILOSEC) 20 MG capsule, TAKE 1 CAPSULE BY MOUTH TWICE DAILY BEFORE MEAL(S), Disp: 60 capsule, Rfl: 5   PARoxetine (PAXIL-CR) 25 MG 24 hr tablet, Take 1 tablet by mouth once daily, Disp: 90 tablet, Rfl: 0   telmisartan (MICARDIS) 40 MG tablet, Take 1 tablet by mouth once daily, Disp: 90 tablet, Rfl: 0   triamterene-hydrochlorothiazide (MAXZIDE-25) 37.5-25 MG tablet, Take 1 tablet by mouth once daily, Disp: 30 tablet, Rfl: 5   TURMERIC PO, Take 1 capsule by mouth daily. ,  Disp: , Rfl:    Vitamin D, Ergocalciferol, (DRISDOL) 1.25 MG (50000 UNIT) CAPS capsule, Take 1 capsule (50,000 Units total) by mouth every 7 (seven) days., Disp: 4 capsule, Rfl: 0   pravastatin (PRAVACHOL) 40 MG tablet, Take 1 tablet (40 mg total) by mouth at bedtime., Disp: 90 tablet, Rfl: 4 Social History   Socioeconomic History   Marital status: Married    Spouse name: Not on file   Number of children: Not on file   Years of education: Not on file   Highest education level: 12th grade  Occupational History   Occupation: Full time   Occupation: Naval architect  Tobacco Use   Smoking status: Never   Smokeless tobacco: Never  Vaping Use   Vaping status: Never Used  Substance and Sexual Activity   Alcohol use: No    Alcohol/week: 0.0 standard drinks of alcohol   Drug use: No   Sexual activity: Not on file  Other Topics  Concern   Not on file  Social History Narrative   Not on file   Social Drivers of Health   Financial Resource Strain: Low Risk  (05/02/2023)   Overall Financial Resource Strain (CARDIA)    Difficulty of Paying Living Expenses: Not very hard  Food Insecurity: No Food Insecurity (05/02/2023)   Hunger Vital Sign    Worried About Running Out of Food in the Last Year: Never true    Ran Out of Food in the Last Year: Never true  Transportation Needs: No Transportation Needs (05/02/2023)   PRAPARE - Administrator, Civil Service (Medical): No    Lack of Transportation (Non-Medical): No  Physical Activity: Inactive (05/02/2023)   Exercise Vital Sign    Days of Exercise per Week: 1 day    Minutes of Exercise per Session: 0 min  Stress: No Stress Concern Present (05/02/2023)   [employer] of Occupational Health - Occupational Stress Questionnaire    Feeling of Stress : Not at all  Social Connections: Moderately Isolated (05/02/2023)   Social Connection and Isolation Panel [NHANES]    Frequency of Communication with Friends and Family: Twice a week    Frequency of Social Gatherings with Friends and Family: Once a week    Attends Religious Services: Never    Database administrator or Organizations: No    Attends Engineer, structural: Not on file    Marital Status: Married  Catering manager Violence: Not At Risk (04/18/2023)   Received from [facility]   HITS    Over the last 12 months how often did your partner physically hurt you?: Never    Over the last 12 months how often did your partner insult you or talk down to you?: Never    Over the last 12 months how often did your partner threaten you with physical harm?: Never    Over the last 12 months how often did your partner scream or curse at you?: Never   Family History  Problem Relation Age of Onset   Diabetes Mother    Scleroderma Sister    Diabetes Brother    Diabetes Brother    Colon cancer Other         family history    Prostate cancer Other        family history    Lung disease Neg Hx     Objective: Office vital signs reviewed. BP 116/77   Pulse 63   Temp 98.7 F (37.1 C)  Wt 248 lb 9.6 oz (112.8 kg)   SpO2 95%   BMI 36.71 kg/m   Physical Examination:  General: Awake, alert, morbidly obese, No acute distress HEENT: sclera white, MMM Cardio: regular rate and rhythm, S1S2 heard, no murmurs appreciated Pulm: clear to auscultation bilaterally, no wheezes, rhonchi or rales; normal work of breathing on room air MSK: antalgic but independent gait Skin: Irregularly shaped patchy area of maculopapular rash noted on the right lateral dorsal aspect of the foot.  No exudates, bleeding or vesicles appreciated  Recent Results (from the past 2160 hours)  Bayer DCA Hb A1c Waived     Status: Abnormal   Collection Time: 07/24/23  9:34 AM  Result Value Ref Range   HB A1C (BAYER DCA - WAIVED) 5.7 (H) 4.8 - 5.6 %    Comment:          Prediabetes: 5.7 - 6.4          Diabetes: >6.4          Glycemic control for adults with diabetes: <7.0   Lipid Panel     Status: None   Collection Time: 07/24/23  9:36 AM  Result Value Ref Range   Cholesterol, Total 118 100 - 199 mg/dL   Triglycerides 93 0 - 149 mg/dL   HDL 44 >16 mg/dL   VLDL Cholesterol Cal 18 5 - 40 mg/dL   LDL Chol Calc (NIH) 56 0 - 99 mg/dL   Chol/HDL Ratio 2.7 0.0 - 5.0 ratio    Comment:                                   T. Chol/HDL Ratio                                             Men  Women                               1/2 Avg.Risk  3.4    3.3                                   Avg.Risk  5.0    4.4                                2X Avg.Risk  9.6    7.1                                3X Avg.Risk 23.4   11.0   CMP14+EGFR     Status: Abnormal   Collection Time: 07/24/23  9:36 AM  Result Value Ref Range   Glucose 102 (H) 70 - 99 mg/dL   BUN 22 8 - 27 mg/dL   Creatinine, Ser 1.09 (L) 0.76 - 1.27 mg/dL   eGFR 604 >54  UJ/WJX/9.14   BUN/Creatinine Ratio 31 (H) 10 - 24   Sodium 141 134 - 144 mmol/L   Potassium 4.1 3.5 - 5.2 mmol/L   Chloride 101 96 - 106 mmol/L   CO2 25 20 - 29 mmol/L   Calcium  9.2 8.6 - 10.2 mg/dL   Total Protein 6.6 6.0 - 8.5 g/dL   Albumin 4.1 3.9 - 4.9 g/dL   Globulin, Total 2.5 1.5 - 4.5 g/dL   Bilirubin Total 0.3 0.0 - 1.2 mg/dL   Alkaline Phosphatase 108 44 - 121 IU/L   AST 16 0 - 40 IU/L   ALT 20 0 - 44 IU/L   Assessment/ Plan: 63 y.o. male   Prediabetes  Morbid obesity (HCC)  BMI 36.0-36.9,adult  Severe obstructive sleep apnea-hypopnea syndrome  Pure hypercholesterolemia - Plan: pravastatin (PRAVACHOL) 40 MG tablet  Intertrigo  Osteopenia of left femoral neck - Plan: DG [facility] DEXA  We reviewed his labs today in office.  A1c coming down but still prediabetic range.  We discussed ongoing weightbearing exercise, strength training.  Cholesterol remains well-controlled.  Refills of Pravachol sent.  We discussed consideration for Zepbound given obesity, severe obstructive sleep apnea and multiple comorbidities associated with morbid obesity.  He seems to be amenable to this.  I think that waiting until after his neck surgery would be ideal and so we will schedule an appointment to further discuss.  In the interim I have encouraged him to read about this medication and report back to me with any questions or concerns  We discussed ways to prevent intertrigo.  He had a rash on his right foot that seem to be fungal as well and he may use his nystatin cream to that affected area  DEXA scan ordered given history of osteopenia of left femoral neck  Jacob Mayabb Hulen Skains, DO [facility] [phone]

## 2023-08-08 ENCOUNTER — Other Ambulatory Visit: Payer: Self-pay | Admitting: Family Medicine

## 2023-08-08 DIAGNOSIS — I10 Essential (primary) hypertension: Secondary | ICD-10-CM

## 2023-08-17 ENCOUNTER — Other Ambulatory Visit: Payer: Self-pay | Admitting: Family Medicine

## 2023-08-17 ENCOUNTER — Telehealth: Payer: Self-pay | Admitting: Family Medicine

## 2023-08-17 DIAGNOSIS — F411 Generalized anxiety disorder: Secondary | ICD-10-CM

## 2023-08-17 DIAGNOSIS — F339 Major depressive disorder, recurrent, unspecified: Secondary | ICD-10-CM

## 2023-09-06 ENCOUNTER — Other Ambulatory Visit: Payer: Self-pay | Admitting: Family Medicine

## 2023-09-06 DIAGNOSIS — I10 Essential (primary) hypertension: Secondary | ICD-10-CM

## 2023-09-13 ENCOUNTER — Encounter (HOSPITAL_COMMUNITY): Payer: Self-pay

## 2023-09-20 ENCOUNTER — Other Ambulatory Visit

## 2023-09-20 ENCOUNTER — Ambulatory Visit: Admitting: Family Medicine

## 2023-10-16 ENCOUNTER — Telehealth: Payer: Self-pay | Admitting: Family Medicine

## 2023-10-16 ENCOUNTER — Telehealth: Payer: Self-pay

## 2023-10-16 ENCOUNTER — Other Ambulatory Visit (HOSPITAL_COMMUNITY): Payer: Self-pay

## 2023-10-16 ENCOUNTER — Encounter: Payer: Self-pay | Admitting: Family Medicine

## 2023-10-16 ENCOUNTER — Telehealth: Payer: Self-pay | Admitting: Cardiovascular Disease

## 2023-10-16 ENCOUNTER — Ambulatory Visit

## 2023-10-16 ENCOUNTER — Ambulatory Visit: Payer: 59 | Admitting: Family Medicine

## 2023-10-16 VITALS — BP 188/84 | HR 99 | Temp 98.6°F | Ht 69.0 in | Wt 247.4 lb

## 2023-10-16 DIAGNOSIS — K449 Diaphragmatic hernia without obstruction or gangrene: Secondary | ICD-10-CM

## 2023-10-16 DIAGNOSIS — Z114 Encounter for screening for human immunodeficiency virus [HIV]: Secondary | ICD-10-CM

## 2023-10-16 DIAGNOSIS — F339 Major depressive disorder, recurrent, unspecified: Secondary | ICD-10-CM

## 2023-10-16 DIAGNOSIS — I77810 Thoracic aortic ectasia: Secondary | ICD-10-CM

## 2023-10-16 DIAGNOSIS — E559 Vitamin D deficiency, unspecified: Secondary | ICD-10-CM

## 2023-10-16 DIAGNOSIS — Z6836 Body mass index (BMI) 36.0-36.9, adult: Secondary | ICD-10-CM | POA: Diagnosis not present

## 2023-10-16 DIAGNOSIS — F411 Generalized anxiety disorder: Secondary | ICD-10-CM

## 2023-10-16 DIAGNOSIS — I700 Atherosclerosis of aorta: Secondary | ICD-10-CM

## 2023-10-16 DIAGNOSIS — I7121 Aneurysm of the ascending aorta, without rupture: Secondary | ICD-10-CM

## 2023-10-16 DIAGNOSIS — I10 Essential (primary) hypertension: Secondary | ICD-10-CM

## 2023-10-16 DIAGNOSIS — Z1159 Encounter for screening for other viral diseases: Secondary | ICD-10-CM

## 2023-10-16 DIAGNOSIS — Z0000 Encounter for general adult medical examination without abnormal findings: Secondary | ICD-10-CM

## 2023-10-16 DIAGNOSIS — Z0001 Encounter for general adult medical examination with abnormal findings: Secondary | ICD-10-CM

## 2023-10-16 DIAGNOSIS — D649 Anemia, unspecified: Secondary | ICD-10-CM

## 2023-10-16 DIAGNOSIS — N401 Enlarged prostate with lower urinary tract symptoms: Secondary | ICD-10-CM

## 2023-10-16 DIAGNOSIS — G4733 Obstructive sleep apnea (adult) (pediatric): Secondary | ICD-10-CM

## 2023-10-16 DIAGNOSIS — K219 Gastro-esophageal reflux disease without esophagitis: Secondary | ICD-10-CM

## 2023-10-16 MED ORDER — TELMISARTAN 40 MG PO TABS
40.0000 mg | ORAL_TABLET | Freq: Every day | ORAL | 3 refills | Status: DC
Start: 2023-10-16 — End: 2023-12-04

## 2023-10-16 MED ORDER — OMEPRAZOLE 20 MG PO CPDR: 20.0000 mg | DELAYED_RELEASE_CAPSULE | Freq: Every day | ORAL | 3 refills | Status: DC | PRN

## 2023-10-16 MED ORDER — TRIAMTERENE-HCTZ 37.5-25 MG PO TABS
1.0000 | ORAL_TABLET | Freq: Every day | ORAL | 3 refills | Status: DC
Start: 2023-10-16 — End: 2023-12-04

## 2023-10-16 MED ORDER — PAROXETINE HCL ER 25 MG PO TB24: 25.0000 mg | ORAL_TABLET | Freq: Every day | ORAL | 3 refills | Status: AC

## 2023-10-16 NOTE — Telephone Encounter (Signed)
 Copied from CRM [id number]. Topic: Clinical - Prescription Issue >> Oct 16, 2023 11:25 AM Tiffany H wrote: Reason for CRM: Patient called to advise that Zepbound is covered provided a Prior Authorization is completed. Form was sent from [facility]. Please assist.

## 2023-10-16 NOTE — Telephone Encounter (Signed)
 Pharmacy Patient Advocate Encounter  Received notification from [facility] that Prior Authorization for Zepbound 2.5 has been APPROVED from 10/16/23 to 06/12/24. Ran test claim, Copay is $4.00. This test claim was processed through [facility]- copay amounts may vary at other pharmacies due to pharmacy/plan contracts, or as the patient moves through the different stages of their insurance plan.   PA #/Case ID/Reference #: [id number]

## 2023-10-16 NOTE — Telephone Encounter (Signed)
 Pharmacy Patient Advocate Encounter   Received notification from Patient Pharmacy that prior authorization for Zepbound 2.5 is required/requested.   Insurance verification completed.   The patient is insured through [facility] .   Per test claim: PA required; PA submitted to above mentioned insurance via CoverMyMeds Key/confirmation #/EOC [id number] Status is pending

## 2023-10-16 NOTE — Progress Notes (Signed)
 Jacob Cline is a 63 y.o. male presents to office today for annual physical exam examination.    Concerns today include: 1.  Obesity associated with severe obstructive sleep apnea, chronic diastolic heart failure, hyperlipidemia and hypertension Patient would like to go ahead and proceed with discussion about obesity.  He has known severe obstructive sleep apnea is compliant with CPAP machine.  No known family history of medullary thyroid  cancer, multiple endocrine type II neoplasia.  He has not contacted his insurance to see if these medications are covered or not.  Has known history of diastolic heart failure and an aneurysm but has no known MI or CVA history.  He has tried diet modification, increase physical exercise and efforts to improve weight but has been unsuccessful  Occupation: working, Marital status: married, Substance use: none Health Maintenance Due  Topic Date Due   HIV Screening  Never done   Hepatitis C Screening  Never done   COVID-19 Vaccine (3 - 2024-25 season) 01/08/2023   DEXA SCAN  07/23/2023   Refills needed today: all  Immunization History  Administered Date(s) Administered   Influenza, Seasonal, Injecte, Preservative Fre 01/25/2021, 03/13/2023   Influenza,inj,Quad PF,6+ Mos 02/11/2019, 02/10/2020, 01/25/2021, 02/25/2022   Influenza-Unspecified 02/11/2019, 02/10/2020, 01/25/2021   Moderna Sars-Covid-2 Vaccination 08/12/2019, 09/09/2019   PNEUMOCOCCAL CONJUGATE-20 02/25/2022   Td 05/10/2003, 01/20/2017   Td (Adult), 2 Lf Tetanus Toxid, Preservative Free 05/10/2003, 01/20/2017   Zoster Recombinant(Shingrix ) 01/12/2018, 08/13/2018   Past Medical History:  Diagnosis Date   Aortic aneurysm (HCC)    Back pain    BPH (benign prostatic hypertrophy)    Cyst of left kidney    Edema of both lower extremities    GERD (gastroesophageal reflux disease)    Hemorrhoids    Hiatal hernia    Hyperlipidemia    Hypertension    Irregular heart beat    Joint pain     Obesity    Post-operative nausea and vomiting    Vitamin D  deficiency    Social History   Socioeconomic History   Marital status: Married    Spouse name: Not on file   Number of children: Not on file   Years of education: Not on file   Highest education level: 12th grade  Occupational History   Occupation: Full time   Occupation: Naval architect  Tobacco Use   Smoking status: Never   Smokeless tobacco: Never  Vaping Use   Vaping status: Never Used  Substance and Sexual Activity   Alcohol use: No    Alcohol/week: 0.0 standard drinks of alcohol   Drug use: No   Sexual activity: Not on file  Other Topics Concern   Not on file  Social History Narrative   Not on file   Social Drivers of Health   Financial Resource Strain: Low Risk  (05/02/2023)   Overall Financial Resource Strain (CARDIA)    Difficulty of Paying Living Expenses: Not very hard  Food Insecurity: No Food Insecurity (08/29/2023)   Received from [facility]   Hunger Vital Sign    Worried About Running Out of Food in the Last Year: Never true    Ran Out of Food in the Last Year: Never true  Transportation Needs: No Transportation Needs (08/29/2023)   Received from [facility] - Transportation    Lack of Transportation (Medical): No    Lack of Transportation (Non-Medical): No  Physical Activity: Inactive (05/02/2023)   Exercise Vital Sign    Days of  Exercise per Week: 1 day    Minutes of Exercise per Session: 0 min  Stress: No Stress Concern Present (08/29/2023)   Received from [facility] of Occupational Health - Occupational Stress Questionnaire    Feeling of Stress : Not at all  Social Connections: Moderately Isolated (05/02/2023)   Social Connection and Isolation Panel [NHANES]    Frequency of Communication with Friends and Family: Twice a week    Frequency of Social Gatherings with Friends and Family: Once a week    Attends Religious Services: Never    Doctor, general practice or Organizations: No    Attends Engineer, structural: Not on file    Marital Status: Married  Catering manager Violence: Not At Risk (08/29/2023)   Received from [facility]   HITS    Over the last 12 months how often did your partner physically hurt you?: Never    Over the last 12 months how often did your partner insult you or talk down to you?: Never    Over the last 12 months how often did your partner threaten you with physical harm?: Never    Over the last 12 months how often did your partner scream or curse at you?: Never   Past Surgical History:  Procedure Laterality Date   athroscopy of rt knee  02/02   henia - umbilical surgery  07/17/07   HERNIA REPAIR  05/15/2017   double hernia surgery   HIP SURGERY  11/2014   right hip   MOUTH SURGERY     VASECTOMY     Family History  Problem Relation Age of Onset   Diabetes Mother    Scleroderma Sister    Diabetes Brother    Diabetes Brother    Colon cancer Other        family history    Prostate cancer Other        family history    Lung disease Neg Hx     Current Outpatient Medications:    acetaminophen  (TYLENOL ) 500 MG tablet, Take by mouth., Disp: , Rfl:    celecoxib  (CELEBREX ) 100 MG capsule, Take 1 capsule (100 mg total) by mouth 2 (two) times daily., Disp: 180 capsule, Rfl: 3   Cinnamon 500 MG capsule, Take 1,000 mg by mouth daily. , Disp: , Rfl:    Multiple Vitamin (MULTIVITAMIN) tablet, Take 1 tablet by mouth daily., Disp: , Rfl:    Omega-3 Fatty Acids (FISH OIL ) 1000 MG CAPS, Take 1 capsule by mouth daily. , Disp: , Rfl:    pravastatin  (PRAVACHOL ) 40 MG tablet, Take 1 tablet (40 mg total) by mouth at bedtime., Disp: 90 tablet, Rfl: 4   TURMERIC PO, Take 1 capsule by mouth daily. , Disp: , Rfl:    omeprazole  (PRILOSEC) 20 MG capsule, Take 1 capsule (20 mg total) by mouth daily as needed (heart burn). Put on file, Disp: 100 capsule, Rfl: 3   PARoxetine  (PAXIL -CR) 25 MG 24 hr tablet, Take 1 tablet (25  mg total) by mouth daily., Disp: 100 tablet, Rfl: 3   telmisartan  (MICARDIS ) 40 MG tablet, Take 1 tablet (40 mg total) by mouth daily., Disp: 100 tablet, Rfl: 3   triamterene -hydrochlorothiazide (MAXZIDE-25) 37.5-25 MG tablet, Take 1 tablet by mouth daily., Disp: 100 tablet, Rfl: 3  Allergies  Allergen Reactions   Other Anaphylaxis    Make me hurt alot   Codeine     Unknown    Lovastatin  Makes me hurt alot   Niaspan [Niacin Er (Antihyperlipidemic)]     Make me hurt alot     ROS: Review of Systems Pertinent items noted in HPI and remainder of comprehensive ROS otherwise negative.    Physical exam BP (!) 188/84   Pulse 99   Temp 98.6 F (37 C)   Ht 5\' 9"  (1.753 m)   Wt 247 lb 6.4 oz (112.2 kg)   SpO2 93%   BMI 36.53 kg/m  General appearance: alert, cooperative, appears stated age, and morbidly obese Head: Normocephalic, without obvious abnormality, atraumatic Eyes: negative findings: lids and lashes normal, conjunctivae and sclerae normal, corneas clear, and pupils equal, round, reactive to light and accomodation Ears: normal TM's and external ear canals both ears Nose: Nares normal. Septum midline. Mucosa normal. No drainage or sinus tenderness. Throat: lips, mucosa, and tongue normal; teeth and gums normal Neck: no adenopathy, supple, symmetrical, trachea midline, and thyroid  not enlarged, symmetric, no tenderness/mass/nodules Back: symmetric, no curvature. ROM normal. No CVA tenderness. Lungs: clear to auscultation bilaterally Chest wall: no tenderness Heart: regular rate and rhythm, S1, S2 normal, no murmur, click, rub or gallop Abdomen: Obese, soft, nontender.  No palpable masses Extremities: Trace ankle edema with venous stasis dermatitis present anteriorly. Pulses: 2+ and symmetric Skin: Well-healed postsurgical scarring noted along bilateral anterior knee.  Postinflammatory hyperpigmentation at the areas of venous stasis as above Lymph nodes: Cervical,  supraclavicular, and axillary nodes normal. Neurologic: Grossly normal      10/16/2023    8:49 AM 07/31/2023   10:06 AM 04/10/2023    9:08 AM  Depression screen PHQ 2/9  Decreased Interest 0 0 0  Down, Depressed, Hopeless 0 0 0  PHQ - 2 Score 0 0 0  Altered sleeping 0 0 0  Tired, decreased energy 0 0 0  Change in appetite 0 0 0  Feeling bad or failure about yourself  0 0 0  Trouble concentrating 0 0 0  Moving slowly or fidgety/restless 0 0 0  Suicidal thoughts 0 0 0  PHQ-9 Score 0 0 0  Difficult doing work/chores Not difficult at all Not difficult at all Not difficult at all      10/16/2023    8:49 AM 07/31/2023   10:05 AM 04/10/2023    9:08 AM 12/09/2022    9:51 AM  GAD 7 : Generalized Anxiety Score  Nervous, Anxious, on Edge 0 0 0 0  Control/stop worrying 0 0 0 0  Worry too much - different things 0 0 0 0  Trouble relaxing 0 0 0 0  Restless 0 0 0 0  Easily annoyed or irritable 0 0 0 0  Afraid - awful might happen 0 0 0 0  Total GAD 7 Score 0 0 0 0  Anxiety Difficulty Not difficult at all  Not difficult at all Not difficult at all     Assessment/ Plan: Jacob Cline here for annual physical exam.   Annual physical exam  BMI 36.0-36.9,adult  Severe obstructive sleep apnea  Essential hypertension - Plan: CMP14+EGFR, triamterene -hydrochlorothiazide (MAXZIDE-25) 37.5-25 MG tablet, telmisartan  (MICARDIS ) 40 MG tablet  Aortic atherosclerosis (HCC) - Plan: CMP14+EGFR  Vitamin D  deficiency - Plan: VITAMIN D  25 Hydroxy (Vit-D Deficiency, Fractures), CMP14+EGFR  Benign prostatic hyperplasia with incomplete bladder emptying - Plan: PSA  Anemia, unspecified type - Plan: Anemia Profile B  Encounter for hepatitis C screening test for low risk patient - Plan: Hepatitis C antibody  Screening for HIV (human immunodeficiency virus) -  Plan: HIV antibody (with reflex)  Depression, recurrent (HCC) - Plan: PARoxetine  (PAXIL -CR) 25 MG 24 hr tablet  GAD (generalized anxiety  disorder) - Plan: PARoxetine  (PAXIL -CR) 25 MG 24 hr tablet  Hiatal hernia with gastroesophageal reflux - Plan: omeprazole  (PRILOSEC) 20 MG capsule  Glad to start him on Zepbound as he does qualify based on BMI and comorbidities including severe obstructive sleep apnea.  However, he will contact them to see if this is something that is a covered medication as he is not fully on Medicare yet  Blood pressure was somewhat confounding today.  Electronic read was 96/57 and then manual read was 180 systolic.  I would like him to take his medications and return on Friday for blood pressure recheck with nurse.  If persistently abnormal, may need to consider additional medication including Norvasc .  Keep regular scheduled follow-up with cardiology as directed  Continue cholesterol medications to prevent progression of aortic atherosclerosis  Check vitamin D  level, DEXA scan today given history of deficiency and osteopenia  Prostate symptoms sound stable.  PSA collected  Check anemia panel given persistent anemia noted on last lab draws  Screening hepatitis C and HIV were collected today.  No known exposures  Okay to space omeprazole  out to as needed use.  Discussed that use of GIP or GLP may increase frequency.  Discussed risks associated with chronic PPI use.  Mood is stable.  Paxil  renewed  Counseled on healthy lifestyle choices, including diet (rich in fruits, vegetables and lean meats and low in salt and simple carbohydrates) and exercise (at least 30 minutes of moderate physical activity daily).  Patient to follow up Friday for blood pressure check with nurse.  3 to 6 months for BP check with me  Katelind Pytel M. Bonnell Butcher, DO

## 2023-10-16 NOTE — Telephone Encounter (Signed)
 Pt advised that the CT of his Aorta is in the system and I will message the [facility]  to go ahead and reach out to him to make his Appt.

## 2023-10-16 NOTE — Telephone Encounter (Signed)
 New Message:    Patient wants to know if he needs a CT? He says he usually have one.

## 2023-10-16 NOTE — Telephone Encounter (Signed)
Left detailed message per dpr  

## 2023-10-17 ENCOUNTER — Ambulatory Visit: Payer: Self-pay | Admitting: Family Medicine

## 2023-10-17 ENCOUNTER — Other Ambulatory Visit (HOSPITAL_COMMUNITY): Payer: Self-pay

## 2023-10-17 ENCOUNTER — Ambulatory Visit

## 2023-10-17 DIAGNOSIS — D696 Thrombocytopenia, unspecified: Secondary | ICD-10-CM

## 2023-10-17 DIAGNOSIS — D649 Anemia, unspecified: Secondary | ICD-10-CM

## 2023-10-17 LAB — ANEMIA PROFILE B
Basophils Absolute: 0 10*3/uL (ref 0.0–0.2)
Basos: 0 %
EOS (ABSOLUTE): 0.4 10*3/uL (ref 0.0–0.4)
Eos: 10 %
Ferritin: 336 ng/mL (ref 30–400)
Folate: 8.7 ng/mL (ref >3.0)
Hematocrit: 36.8 % — ABNORMAL LOW (ref 37.5–51.0)
Hemoglobin: 11.8 g/dL — ABNORMAL LOW (ref 13.0–17.7)
Immature Grans (Abs): 0 10*3/uL (ref 0.0–0.1)
Immature Granulocytes: 0 %
Iron Saturation: 55 % (ref 15–55)
Iron: 168 ug/dL (ref 38–169)
Lymphocytes Absolute: 1.3 10*3/uL (ref 0.7–3.1)
Lymphs: 37 %
MCH: 27.8 pg (ref 26.6–33.0)
MCHC: 32.1 g/dL (ref 31.5–35.7)
MCV: 87 fL (ref 79–97)
Monocytes Absolute: 0.7 10*3/uL (ref 0.1–0.9)
Monocytes: 20 %
Neutrophils Absolute: 1.2 10*3/uL — ABNORMAL LOW (ref 1.4–7.0)
Neutrophils: 33 %
Platelets: 131 10*3/uL — ABNORMAL LOW (ref 150–450)
RBC: 4.25 x10E6/uL (ref 4.14–5.80)
RDW: 14.9 % (ref 11.6–15.4)
Retic Ct Pct: <0.2 % — ABNORMAL LOW (ref 0.6–2.6)
Total Iron Binding Capacity: 308 ug/dL (ref 250–450)
UIBC: 140 ug/dL (ref 111–343)
Vitamin B-12: 422 pg/mL (ref 232–1245)
WBC: 3.6 10*3/uL (ref 3.4–10.8)

## 2023-10-17 LAB — CMP14+EGFR
ALT: 22 IU/L (ref 0–44)
AST: 18 IU/L (ref 0–40)
Albumin: 4.1 g/dL (ref 3.9–4.9)
Alkaline Phosphatase: 97 IU/L (ref 44–121)
BUN/Creatinine Ratio: 40 — ABNORMAL HIGH (ref 10–24)
BUN: 26 mg/dL (ref 8–27)
Bilirubin Total: 0.3 mg/dL (ref 0.0–1.2)
CO2: 22 mmol/L (ref 20–29)
Calcium: 9 mg/dL (ref 8.6–10.2)
Chloride: 100 mmol/L (ref 96–106)
Creatinine, Ser: 0.65 mg/dL — ABNORMAL LOW (ref 0.76–1.27)
Globulin, Total: 2.6 g/dL (ref 1.5–4.5)
Glucose: 93 mg/dL (ref 70–99)
Potassium: 3.9 mmol/L (ref 3.5–5.2)
Sodium: 139 mmol/L (ref 134–144)
Total Protein: 6.7 g/dL (ref 6.0–8.5)
eGFR: 105 mL/min/{1.73_m2} (ref >59)

## 2023-10-17 LAB — HEPATITIS C ANTIBODY

## 2023-10-17 LAB — PSA: Prostate Specific Ag, Serum: 0.6 ng/mL (ref 0.0–4.0)

## 2023-10-17 LAB — VITAMIN D 25 HYDROXY (VIT D DEFICIENCY, FRACTURES): Vit D, 25-Hydroxy: 42.1 ng/mL (ref 30.0–100.0)

## 2023-10-17 LAB — HIV ANTIBODY (ROUTINE TESTING W REFLEX)

## 2023-10-18 MED ORDER — ZEPBOUND 7.5 MG/0.5ML ~~LOC~~ SOAJ
7.5000 mg | SUBCUTANEOUS | 0 refills | Status: DC
Start: 2023-10-18 — End: 2023-12-04

## 2023-10-18 MED ORDER — ONDANSETRON 4 MG PO TBDP: 4.0000 mg | ORAL_TABLET | Freq: Three times a day (TID) | ORAL | 0 refills | Status: DC | PRN

## 2023-10-18 MED ORDER — ZEPBOUND 10 MG/0.5ML ~~LOC~~ SOAJ
10.0000 mg | SUBCUTANEOUS | 0 refills | Status: DC
Start: 2023-10-18 — End: 2023-12-04

## 2023-10-18 MED ORDER — ZEPBOUND 2.5 MG/0.5ML ~~LOC~~ SOAJ: 2.5000 mg | SUBCUTANEOUS | 0 refills | Status: DC

## 2023-10-18 MED ORDER — ZEPBOUND 5 MG/0.5ML ~~LOC~~ SOAJ: 5.0000 mg | SUBCUTANEOUS | 0 refills | Status: DC

## 2023-10-18 NOTE — Addendum Note (Signed)
 Addended by: Eliodoro Guerin on: 10/18/2023 10:40 AM   Modules accepted: Orders

## 2023-10-18 NOTE — Telephone Encounter (Signed)
 I haven't ordered it yet.  He was calling ins to check on formulary.  Should be getting one soon though. I'm placing order now.

## 2023-10-19 ENCOUNTER — Other Ambulatory Visit (HOSPITAL_COMMUNITY): Payer: Self-pay

## 2023-10-20 ENCOUNTER — Telehealth: Payer: Self-pay | Admitting: Cardiovascular Disease

## 2023-10-20 ENCOUNTER — Ambulatory Visit (INDEPENDENT_AMBULATORY_CARE_PROVIDER_SITE_OTHER): Admitting: *Deleted

## 2023-10-20 VITALS — BP 107/64 | HR 67

## 2023-10-20 DIAGNOSIS — I10 Essential (primary) hypertension: Secondary | ICD-10-CM

## 2023-10-20 NOTE — Telephone Encounter (Signed)
 Follow Up:     Patient says he is returning a call from this morning.  He said he thought it was Doctor, general practice.

## 2023-10-20 NOTE — Progress Notes (Signed)
 Patient is in office today for a nurse visit for Blood Pressure Check. Patient blood pressure was 107/64, Patient deines any symptoms.

## 2023-10-20 NOTE — Telephone Encounter (Signed)
 Pt returned call to office and states he'd like to CT here at [facility] office. New order placed at this time. He's scheduled her with Tessa on 12/04/23, so will need to have it done by this date if at all possible. Order updated to reflect this and message routed to [place] for scheduling.

## 2023-10-20 NOTE — Telephone Encounter (Signed)
 Called and left detailed message for patient with the below information. Advised if he wants to come to [facility] office to call or send message and we could change the order and send to our scheduler.  [name] to Alanna Alley, RN   10/17/23  9:32 AM Or the order can be changed to reflect the [facility] location and Madeline Schaumann will call them to set it up.  [name] to Alanna Alley, RN   10/17/23  9:32 AM Since the location was entered as [facility] central scheduling would setup the CT. The patient would need to be advised to call them to schedule due to them not working from a workqueue. Their number is [phone].

## 2023-10-20 NOTE — Addendum Note (Signed)
 Addended by: Keller Patella on: 10/20/2023 10:36 AM   Modules accepted: Orders

## 2023-10-20 NOTE — Telephone Encounter (Signed)
 Completed in separate encounter

## 2023-10-31 ENCOUNTER — Ambulatory Visit (INDEPENDENT_AMBULATORY_CARE_PROVIDER_SITE_OTHER)

## 2023-10-31 ENCOUNTER — Other Ambulatory Visit

## 2023-10-31 DIAGNOSIS — D649 Anemia, unspecified: Secondary | ICD-10-CM

## 2023-10-31 DIAGNOSIS — M8588 Other specified disorders of bone density and structure, other site: Secondary | ICD-10-CM | POA: Diagnosis not present

## 2023-10-31 DIAGNOSIS — M85852 Other specified disorders of bone density and structure, left thigh: Secondary | ICD-10-CM

## 2023-10-31 DIAGNOSIS — D696 Thrombocytopenia, unspecified: Secondary | ICD-10-CM

## 2023-10-31 LAB — CBC WITH DIFFERENTIAL/PLATELET
Basophils Absolute: 0.1 10*3/uL (ref 0.0–0.2)
Basos: 1 %
EOS (ABSOLUTE): 0.1 10*3/uL (ref 0.0–0.4)
Eos: 2 %
Hematocrit: 37.4 % — ABNORMAL LOW (ref 37.5–51.0)
Hemoglobin: 11.7 g/dL — ABNORMAL LOW (ref 13.0–17.7)
Immature Grans (Abs): 0 10*3/uL (ref 0.0–0.1)
Immature Granulocytes: 0 %
Lymphocytes Absolute: 1.4 10*3/uL (ref 0.7–3.1)
Lymphs: 24 %
MCH: 28 pg (ref 26.6–33.0)
MCHC: 31.3 g/dL — ABNORMAL LOW (ref 31.5–35.7)
MCV: 90 fL (ref 79–97)
Monocytes Absolute: 0.7 10*3/uL (ref 0.1–0.9)
Monocytes: 13 %
Neutrophils Absolute: 3.5 10*3/uL (ref 1.4–7.0)
Neutrophils: 60 %
Platelets: 175 10*3/uL (ref 150–450)
RBC: 4.18 x10E6/uL (ref 4.14–5.80)
RDW: 15.8 % — ABNORMAL HIGH (ref 11.6–15.4)
WBC: 5.8 10*3/uL (ref 3.4–10.8)

## 2023-11-01 ENCOUNTER — Ambulatory Visit: Payer: Self-pay | Admitting: Family Medicine

## 2023-11-01 DIAGNOSIS — D649 Anemia, unspecified: Secondary | ICD-10-CM

## 2023-11-02 ENCOUNTER — Ambulatory Visit (HOSPITAL_COMMUNITY)
Admission: RE | Admit: 2023-11-02 | Discharge: 2023-11-02 | Disposition: A | Discharge status: Home / Self Care | Source: Ambulatory Visit | Attending: Cardiovascular Disease | Admitting: Cardiovascular Disease

## 2023-11-02 DIAGNOSIS — I700 Atherosclerosis of aorta: Secondary | ICD-10-CM | POA: Insufficient documentation

## 2023-11-02 DIAGNOSIS — I7121 Aneurysm of the ascending aorta, without rupture: Secondary | ICD-10-CM | POA: Diagnosis present

## 2023-11-02 DIAGNOSIS — I2510 Atherosclerotic heart disease of native coronary artery without angina pectoris: Secondary | ICD-10-CM | POA: Insufficient documentation

## 2023-11-02 DIAGNOSIS — I77810 Thoracic aortic ectasia: Secondary | ICD-10-CM

## 2023-11-02 MED ORDER — IOHEXOL 350 MG/ML SOLN
75.0000 mL | Freq: Once | INTRAVENOUS | Status: AC | PRN
  Administered 2023-11-02: 75 mL via INTRAVENOUS

## 2023-11-03 ENCOUNTER — Ambulatory Visit: Payer: Self-pay | Admitting: Family Medicine

## 2023-11-03 ENCOUNTER — Other Ambulatory Visit

## 2023-11-03 DIAGNOSIS — D649 Anemia, unspecified: Secondary | ICD-10-CM

## 2023-11-05 ENCOUNTER — Ambulatory Visit: Payer: Self-pay | Admitting: Family Medicine

## 2023-11-05 LAB — FECAL OCCULT BLOOD, IMMUNOCHEMICAL: Fecal Occult Bld: NEGATIVE

## 2023-11-08 ENCOUNTER — Telehealth: Payer: Self-pay | Admitting: Family Medicine

## 2023-11-08 ENCOUNTER — Ambulatory Visit: Payer: Self-pay | Admitting: Cardiovascular Disease

## 2023-11-08 DIAGNOSIS — D649 Anemia, unspecified: Secondary | ICD-10-CM

## 2023-11-08 NOTE — Telephone Encounter (Signed)
Patient aware and referral placed.

## 2023-11-08 NOTE — Addendum Note (Signed)
 Addended by: OLENA RAISIN C on: 11/08/2023 04:02 PM   Modules accepted: Orders

## 2023-11-08 NOTE — Telephone Encounter (Signed)
 Pt does need to see hematology.

## 2023-11-20 ENCOUNTER — Telehealth: Payer: Self-pay | Admitting: Family Medicine

## 2023-11-20 DIAGNOSIS — D696 Thrombocytopenia, unspecified: Secondary | ICD-10-CM

## 2023-11-20 DIAGNOSIS — D649 Anemia, unspecified: Secondary | ICD-10-CM

## 2023-11-20 NOTE — Telephone Encounter (Signed)
Orders Placed This Encounter  Procedures  . Ambulatory referral to Hematology / Oncology

## 2023-11-30 NOTE — Progress Notes (Signed)
 Cardiology Office Note   Date:  12/04/2023  ID:  Jacob Cline, DOB 05/08/1961, MRN [id number] PCP: Jolinda Norene HERO, DO  [facility] Providers Cardiologist:  Madonna Large, DO    History of Present Illness Jacob Cline is a 63 y.o. male with a past medical history of chest pain, hypertension, and hyperlipidemia here for follow-up appointment.  History includes a stress test which was normal.  Cardiac catheterization in the past which was also normal.  He had an episode of chest heaviness about a month prior to his office visit 11/14/2022 where he was seen in the ER workup was negative.  Occasional recurrent chest pain.  Usually dull, shooting pain that comes and goes.  Also seems to have chest heaviness that comes at random times and is not associated with specific activity.  Seems to be relieved with antacids.  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  Does not get any regular exercise.  Has taken lovastatin in the past but stopped taking it due to muscle aches.  He has a long history with Dr. Alveta dating back to May 2015.  It was noted he had T wave inversions on the anterior lateral leads for the past several years.  A Myoview was ordered and was normal.  No evidence of ischemia.  He had an echocardiogram which found normal left ventricular systolic function, grade 2 DD, mild dilation of the ascending aorta which was seen April 30, 2018.  Most recent CT angio was done June 2025 with ascending aorta 4.0 cm which is unchanged from previous scans.  Today, he presents with a history of aortic aneurysm who is here for follow-up of his condition.  The aortic aneurysm measures 4.0 cm at the level of the ascending thoracic aorta and remains stable with no changes on recent CT angiogram. He experiences occasional sharp chest pains lasting a second or two, which resolve spontaneously. There is a family history of sudden cardiac events, with a distant cousin who  recently passed away unexpectedly after experiencing chest pain. He has coronary artery disease with calcifications noted on a previous CT scan. His last lipid panel in March showed an LDL of 56 and low triglycerides, indicating good cholesterol control. Persistent leg swelling is present, attributed to venous stasis, and is difficult to manage with compression socks, especially in the summer heat. He has undergone multiple joint replacements, including hip and knee replacements.  Reports no shortness of breath nor dyspnea on exertion. No edema, orthopnea, PND. Reports no palpitations.   Discussed the use of AI scribe software for clinical note transcription with the patient, who gave verbal consent to proceed.   ROS: Pertinent ROS in HPI  Studies Reviewed EKG Interpretation Date/Time:  Monday December 04 2023 08:41:34 EDT Ventricular Rate:  64 PR Interval:  156 QRS Duration:  136 QT Interval:  430 QTC Calculation: 443 R Axis:   -62  Text Interpretation: Sinus rhythm with Premature atrial complexes Left axis deviation Right bundle branch block When compared with ECG of 04-Dec-2023 08:27, Premature atrial complexes are now Present Right bundle branch block is now Present Confirmed by Lucien Blanc [phone]) on 12/04/2023 9:23:49 AM   CTA 10/2023 IMPRESSION: 1. Stable minimal aneurysmal dilation of the ascending thoracic aorta with a maximal diameter of 4.0 cm. Findings remain unchanged dating back to August of 2020. Five years of stability is highly reassuring. This aortic diameter may in fact be normal for this particular patient. 2. Trace atherosclerotic  calcifications along the aorta and coronary arteries. 3. Stable ancillary findings as above without significant interval change.   Aortic Atherosclerosis (ICD10-I70.0).  Physical Exam VS:  BP (!) 93/58 (BP Location: Left Arm, Patient Position: Sitting, Cuff Size: Large)   Pulse (!) 58   Resp 16   Ht 5' 9 (1.753 m)   Wt 247 lb 9.6 oz  (112.3 kg)   SpO2 95%   BMI 36.56 kg/m        Wt Readings from Last 3 Encounters:  12/04/23 247 lb 9.6 oz (112.3 kg)  10/16/23 247 lb 6.4 oz (112.2 kg)  07/31/23 248 lb 9.6 oz (112.8 kg)    GEN: Well nourished, well developed in no acute distress NECK: No JVD; No carotid bruits CARDIAC: RRR, no murmurs, rubs, gallops RESPIRATORY:  Clear to auscultation without rales, wheezing or rhonchi  ABDOMEN: Soft, non-tender, non-distended EXTREMITIES:  No edema; No deformity   ASSESSMENT AND PLAN  Coronary artery disease Coronary artery disease with calcifications. LDL controlled at 56 mg/dL. Occasional non-cardiac chest pains. No echocardiogram since 2019. - Order echocardiogram to assess heart valve and pump function. - Prescribe nitroglycerin  for exertional chest pain, discussed potential side effects. - Monitor lipid panel regularly.  Ascending aortic aneurysm Ascending aortic aneurysm 4.0 cm, stable since 2020. No aortic valve involvement or regurgitation. - Schedule CT scan every two years, next due June/July 2027. - Monitor blood pressure regularly.  Venous insufficiency (chronic) Chronic venous insufficiency with swelling and skin changes, exacerbated by heat. Recent knee replacement may contribute to fluid retention. - Advise wearing compression stockings, especially in cooler weather. - Maintain wound hygiene with triple antibiotic ointment and wound cleaner (from fall)   Hyperlipidemia -continue statin therapy -continue annual labs with PCP      Dispo: He can follow-up with Dr. Michele in a year, sooner if needed.   Signed, Orren LOISE Fabry, PA-C

## 2023-12-04 ENCOUNTER — Ambulatory Visit: Admitting: Physician Assistant

## 2023-12-04 ENCOUNTER — Encounter: Payer: Self-pay | Admitting: Physician Assistant

## 2023-12-04 ENCOUNTER — Ambulatory Visit: Attending: Physician Assistant | Admitting: Physician Assistant

## 2023-12-04 ENCOUNTER — Telehealth: Payer: Self-pay | Admitting: *Deleted

## 2023-12-04 VITALS — BP 93/58 | HR 58 | Resp 16 | Ht 69.0 in | Wt 247.6 lb

## 2023-12-04 DIAGNOSIS — M25552 Pain in left hip: Secondary | ICD-10-CM

## 2023-12-04 DIAGNOSIS — Z6836 Body mass index (BMI) 36.0-36.9, adult: Secondary | ICD-10-CM

## 2023-12-04 DIAGNOSIS — I7121 Aneurysm of the ascending aorta, without rupture: Secondary | ICD-10-CM

## 2023-12-04 DIAGNOSIS — G4733 Obstructive sleep apnea (adult) (pediatric): Secondary | ICD-10-CM

## 2023-12-04 DIAGNOSIS — R079 Chest pain, unspecified: Secondary | ICD-10-CM

## 2023-12-04 DIAGNOSIS — E785 Hyperlipidemia, unspecified: Secondary | ICD-10-CM | POA: Diagnosis not present

## 2023-12-04 DIAGNOSIS — I10 Essential (primary) hypertension: Secondary | ICD-10-CM | POA: Diagnosis not present

## 2023-12-04 DIAGNOSIS — R0602 Shortness of breath: Secondary | ICD-10-CM

## 2023-12-04 DIAGNOSIS — I700 Atherosclerosis of aorta: Secondary | ICD-10-CM

## 2023-12-04 DIAGNOSIS — R600 Localized edema: Secondary | ICD-10-CM

## 2023-12-04 DIAGNOSIS — E7800 Pure hypercholesterolemia, unspecified: Secondary | ICD-10-CM

## 2023-12-04 DIAGNOSIS — R7303 Prediabetes: Secondary | ICD-10-CM

## 2023-12-04 MED ORDER — NITROGLYCERIN 0.4 MG SL SUBL: 0.4000 mg | SUBLINGUAL_TABLET | SUBLINGUAL | 3 refills | Status: AC | PRN

## 2023-12-04 MED ORDER — SEMAGLUTIDE-WEIGHT MANAGEMENT 2.4 MG/0.75ML ~~LOC~~ SOAJ: 2.4000 mg | SUBCUTANEOUS | 12 refills | Status: AC

## 2023-12-04 MED ORDER — TELMISARTAN 40 MG PO TABS: 40.0000 mg | ORAL_TABLET | Freq: Every day | ORAL | 3 refills | Status: AC

## 2023-12-04 MED ORDER — SEMAGLUTIDE-WEIGHT MANAGEMENT 1 MG/0.5ML ~~LOC~~ SOAJ
1.0000 mg | SUBCUTANEOUS | 0 refills | Status: AC
Start: 2024-01-31 — End: 2024-02-28

## 2023-12-04 MED ORDER — SEMAGLUTIDE-WEIGHT MANAGEMENT 0.25 MG/0.5ML ~~LOC~~ SOAJ: 0.2500 mg | SUBCUTANEOUS | 0 refills | Status: AC

## 2023-12-04 MED ORDER — TRIAMTERENE-HCTZ 37.5-25 MG PO TABS: 1.0000 | ORAL_TABLET | Freq: Every day | ORAL | 3 refills | Status: AC

## 2023-12-04 MED ORDER — PRAVASTATIN SODIUM 40 MG PO TABS: 40.0000 mg | ORAL_TABLET | Freq: Every day | ORAL | 4 refills | Status: AC

## 2023-12-04 MED ORDER — SEMAGLUTIDE-WEIGHT MANAGEMENT 1.7 MG/0.75ML ~~LOC~~ SOAJ: 1.7000 mg | SUBCUTANEOUS | 12 refills | Status: AC

## 2023-12-04 MED ORDER — SEMAGLUTIDE-WEIGHT MANAGEMENT 0.5 MG/0.5ML ~~LOC~~ SOAJ: 0.5000 mg | SUBCUTANEOUS | 0 refills | Status: AC

## 2023-12-04 NOTE — Patient Instructions (Signed)
 Medication Instructions:  Refill on medications and continue your current regime.   Testing/Procedures: Your physician has requested that you have an echocardiogram. Echocardiography is a painless test that uses sound waves to create images of your heart. It provides your doctor with information about the size and shape of your heart and how well your heart's chambers and valves are working. This procedure takes approximately one hour. There are no restrictions for this procedure. Please do NOT wear cologne, perfume, aftershave, or lotions (deodorant is allowed). Please arrive 15 minutes prior to your appointment time.  Please note: We ask at that you not bring children with you during ultrasound (echo/ vascular) testing. Due to room size and safety concerns, children are not allowed in the ultrasound rooms during exams. Our front office staff cannot provide observation of children in our lobby area while testing is being conducted. An adult accompanying a patient to their appointment will only be allowed in the ultrasound room at the discretion of the ultrasound technician under special circumstances. We apologize for any inconvenience.   Follow-Up: At [facility], you and your health needs are our priority.  As part of our continuing mission to provide you with exceptional heart care, our providers are all part of one team.  This team includes your primary Cardiologist (physician) and Advanced Practice Providers or APPs (Physician Assistants and Nurse Practitioners) who all work together to provide you with the care you need, when you need it.  Your next appointment:   1 year(s)  Provider:   Madonna Large, DO    We recommend signing up for the patient portal called MyChart.  Sign up information is provided on this After Visit Summary.  MyChart is used to connect with patients for Virtual Visits (Telemedicine).  Patients are able to view lab/test results, encounter notes, upcoming  appointments, etc.  Non-urgent messages can be sent to your provider as well.   To learn more about what you can do with MyChart, go to [email or web address].

## 2023-12-04 NOTE — Telephone Encounter (Signed)
 Pt.notified

## 2023-12-04 NOTE — Telephone Encounter (Signed)
 Please inform patient of this change in his insurance plan.  I have replaced his Zepbound  with Wegovy .  Rx has been sent.  He may advance the dose each month as tolerated  Meds ordered this encounter  Medications   Semaglutide -Weight Management 0.25 MG/0.5ML SOAJ    Sig: Inject 0.25 mg into the skin once a week for 28 days.    Dispense:  2 mL    Refill:  0   Semaglutide -Weight Management 0.5 MG/0.5ML SOAJ    Sig: Inject 0.5 mg into the skin once a week for 28 days.    Dispense:  2 mL    Refill:  0   Semaglutide -Weight Management 1 MG/0.5ML SOAJ    Sig: Inject 1 mg into the skin once a week for 28 days.    Dispense:  2 mL    Refill:  0   Semaglutide -Weight Management 1.7 MG/0.75ML SOAJ    Sig: Inject 1.7 mg into the skin once a week.    Dispense:  3 mL    Refill:  12   Semaglutide -Weight Management 2.4 MG/0.75ML SOAJ    Sig: Inject 2.4 mg into the skin once a week.    Dispense:  3 mL    Refill:  12

## 2023-12-04 NOTE — Telephone Encounter (Signed)
 Pt brought letter in from [employer] [employer] stating there has been a change in his medication coverage. Starting 11/07/23 plan will no longer cover Zepbound  pen 2.5/0.5 Medication covered by plan: Orlistat, Qsymia, Saxenda, Wegovy  Please advise, if appropriate send in a new script If best for pt to stay on current medication request PA

## 2023-12-04 NOTE — Addendum Note (Signed)
 Addended by: JOLINDA NORENE HERO on: 12/04/2023 03:56 PM   Modules accepted: Orders

## 2023-12-10 DIAGNOSIS — D649 Anemia, unspecified: Secondary | ICD-10-CM | POA: Insufficient documentation

## 2023-12-11 ENCOUNTER — Inpatient Hospital Stay

## 2023-12-11 ENCOUNTER — Inpatient Hospital Stay: Attending: Hematology | Admitting: Hematology

## 2023-12-11 VITALS — BP 106/69 | HR 63 | Temp 97.8°F | Resp 18 | Ht 68.0 in | Wt 242.0 lb

## 2023-12-11 DIAGNOSIS — I110 Hypertensive heart disease with heart failure: Secondary | ICD-10-CM | POA: Insufficient documentation

## 2023-12-11 DIAGNOSIS — D649 Anemia, unspecified: Secondary | ICD-10-CM | POA: Diagnosis present

## 2023-12-11 DIAGNOSIS — I5032 Chronic diastolic (congestive) heart failure: Secondary | ICD-10-CM | POA: Diagnosis not present

## 2023-12-11 DIAGNOSIS — Z801 Family history of malignant neoplasm of trachea, bronchus and lung: Secondary | ICD-10-CM | POA: Diagnosis not present

## 2023-12-11 LAB — CBC WITH DIFFERENTIAL/PLATELET
Abs Immature Granulocytes: 0.01 K/uL (ref 0.00–0.07)
Basophils Absolute: 0 K/uL (ref 0.0–0.1)
Basophils Relative: 0 %
Eosinophils Absolute: 0.1 K/uL (ref 0.0–0.5)
Eosinophils Relative: 2 %
HCT: 43.2 % (ref 39.0–52.0)
Hemoglobin: 13.8 g/dL (ref 13.0–17.0)
Immature Granulocytes: 0 %
Lymphocytes Relative: 20 %
Lymphs Abs: 1.2 K/uL (ref 0.7–4.0)
MCH: 28.2 pg (ref 26.0–34.0)
MCHC: 31.9 g/dL (ref 30.0–36.0)
MCV: 88.2 fL (ref 80.0–100.0)
Monocytes Absolute: 0.6 K/uL (ref 0.1–1.0)
Monocytes Relative: 11 %
Neutro Abs: 4 K/uL (ref 1.7–7.7)
Neutrophils Relative %: 67 %
Platelets: 151 K/uL (ref 150–400)
RBC: 4.9 MIL/uL (ref 4.22–5.81)
RDW: 15 % (ref 11.5–15.5)
WBC: 6 K/uL (ref 4.0–10.5)
nRBC: 0 % (ref 0.0–0.2)

## 2023-12-11 LAB — DIRECT ANTIGLOBULIN TEST (NOT AT ARMC)
DAT, IgG: NEGATIVE
DAT, complement: NEGATIVE

## 2023-12-11 LAB — RETICULOCYTES
Immature Retic Fract: 13.3 % (ref 2.3–15.9)
RBC.: 4.92 MIL/uL (ref 4.22–5.81)
Retic Count, Absolute: 64 K/uL (ref 19.0–186.0)
Retic Ct Pct: 1.3 % (ref 0.4–3.1)

## 2023-12-11 LAB — IRON AND TIBC
Iron: 40 ug/dL — ABNORMAL LOW (ref 45–182)
Saturation Ratios: 9 % — ABNORMAL LOW (ref 17.9–39.5)
TIBC: 469 ug/dL — ABNORMAL HIGH (ref 250–450)
UIBC: 429 ug/dL

## 2023-12-11 LAB — LACTATE DEHYDROGENASE: LDH: 169 U/L (ref 98–192)

## 2023-12-11 LAB — FERRITIN: Ferritin: 14 ng/mL — ABNORMAL LOW (ref 24–336)

## 2023-12-11 NOTE — Progress Notes (Signed)
 [facility] [address] [place] [address]   Clinic Day:  12/11/2023  Referring physician: Jolinda Norene HERO, DO  Patient Care Team: Jacob Cline Norene HERO, DO as PCP - General (Family Medicine) Michele Richardson, DO as PCP - Cardiology (Cardiology) Watt Rush, MD as Attending Physician (Urology) Hollar, Lahoma Greener, MD as Referring Physician (Dermatology) Rogers Hai, MD as Medical Oncologist (Hematology)   ASSESSMENT & PLAN:   Assessment:  1.  Normocytic anemia: - Patient seen at the request of Dr. Jolinda - CBC on 10/16/2023 and 10/31/2023 showed hemoglobin 11.7 with MCV 90. - Last colonoscopy was in 2016. - Recent FOBT (11/02/2020): Negative. - Denies BRBPR/melena.  He is not on iron supplements.  No prior history of transfusions. - Recent surgeries include left TKR (April 2025), right TKR (December 2024), left hip replacement (August 2024).  2.  Social/family history: - Lives at home with his wife.  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  Non-smoker. - No family history of significant anemia.  Does not know father side of the family.  Maternal aunt and maternal uncle had lung cancers.  2 maternal cousins had lung cancer.  1 maternal cousin had colon cancer.  Plan:  1.  Normocytic anemia: - Differential diagnosis includes anemia from recent surgeries. - Will repeat CBC today, check for hemolysis, nutritional deficiencies, bone marrow infiltrative process. - Will also check NGS myeloid panel, serum EPO and serum Cystatin C levels. - RTC in 4 weeks to discuss results.   Orders Placed This Encounter  Procedures   CBC with Differential    Standing Status:   Future    Number of Occurrences:   1    Expected Date:   12/11/2023    Expiration Date:   03/10/2024   Lactate dehydrogenase    Standing Status:   Future    Number of Occurrences:   1    Expected Date:   12/11/2023    Expiration Date:   03/10/2024   Reticulocytes    Standing Status:   Future     Number of Occurrences:   1    Expected Date:   12/11/2023    Expiration Date:   03/10/2024   Ferritin    Standing Status:   Future    Number of Occurrences:   1    Expected Date:   12/11/2023    Expiration Date:   03/10/2024   Iron and TIBC ([facility] ONLY)    Standing Status:   Future    Number of Occurrences:   1    Expected Date:   12/11/2023    Expiration Date:   03/10/2024   Methylmalonic acid, serum    Standing Status:   Future    Number of Occurrences:   1    Expected Date:   12/11/2023    Expiration Date:   03/10/2024   Protein electrophoresis, serum    Standing Status:   Future    Number of Occurrences:   1    Expected Date:   12/11/2023    Expiration Date:   03/10/2024   Kappa/lambda light chains    Standing Status:   Future    Number of Occurrences:   1    Expected Date:   12/11/2023    Expiration Date:   03/10/2024   Immunofixation electrophoresis    Standing Status:   Future    Number of Occurrences:   1    Expected Date:   12/11/2023  Expiration Date:   03/10/2024   Copper , serum    Standing Status:   Future    Number of Occurrences:   1    Expected Date:   12/11/2023    Expiration Date:   03/10/2024   Erythropoietin     Standing Status:   Future    Number of Occurrences:   1    Expected Date:   12/11/2023    Expiration Date:   03/10/2024   IntelliGEN Myeloid    Standing Status:   Future    Number of Occurrences:   1    Expected Date:   12/11/2023    Expiration Date:   03/10/2024   Miscellaneous [facility] test (send-out)    Standing Status:   Future    Number of Occurrences:   1    Expected Date:   12/11/2023    Expiration Date:   03/10/2024    Test name / description::   Cystatin C Test #: [id number]    Release to patient:   Immediate   Direct antiglobulin test (not at [facility])    Standing Status:   Future    Number of Occurrences:   1    Expected Date:   12/11/2023    Expiration Date:   03/10/2024      LILLETTE Hummingbird R Teague,acting as a scribe for Alean Stands, MD.,have documented all relevant documentation on the behalf of Alean Stands, MD,as directed by  Alean Stands, MD while in the presence of Alean Stands, MD.   I, Alean Stands MD, have reviewed the above documentation for accuracy and completeness, and I agree with the above.   Alean Stands, MD   8/4/20253:35 PM  CHIEF COMPLAINT/PURPOSE OF CONSULT:   Diagnosis: Normocytic anemia  Current Therapy: Workup  HISTORY OF PRESENT ILLNESS:   Jacob Cline is a 63 y.o. male presenting to clinic today for evaluation of anemia at the request of Jacob Cline, Jacob M, DO.  Patient has a medical history of hypertension, thoracic aortic aneurysm, hyperlipidemia, chronic diastolic heart failure, OSA, and vitamin D  deficiency.   Jacob Cline was seen by his PCP on 10/16/2023 for his annual physical and had labs done the same day. Anemia Profile B from 10/16/2023 showed low HGB at 11.8, low HCT at 36.8, low platelets at 131, low absolute neutrophils at 1.2, and low retic count percent at 0.2. Iron saturation was normal at 55, ferritin was 336, vitamin B 12 was 422, and folate was 8.7. This was his first incidence of thrombocytopenia and anemia prompting repeat lab work to be done on 10/31/2023. CBC diff from 6/24 showed resolved thrombocytopenia with platelets at 175. However, anemia remained with HGB at 11.7. HCT was low at 37.4, MCHC was low at 31.3, and RDW was elevated at 15.8. Fecal occult blood test done on 10/31/2023 was negative.   His most recent colonoscopy was on 05/08/2015 which found no abnormalities.  Today, he states that he is doing well overall. His appetite level is at 100%. His energy level is at 60%.  PAST MEDICAL HISTORY:   Past Medical History: Past Medical History:  Diagnosis Date   Aortic aneurysm (HCC)    Back pain    BPH (benign prostatic hypertrophy)    Cyst of left kidney    Edema of both lower extremities    GERD (gastroesophageal reflux  disease)    Hemorrhoids    Hiatal hernia    Hyperlipidemia    Hypertension    Irregular heart beat    Joint  pain    Obesity    Post-operative nausea and vomiting    Vitamin D  deficiency     Surgical History: Past Surgical History:  Procedure Laterality Date   athroscopy of rt knee  02/02   henia - umbilical surgery  07/17/07   HERNIA REPAIR  05/15/2017   double hernia surgery   HIP SURGERY  11/2014   right hip   MOUTH SURGERY     VASECTOMY      Social History: Social History   Socioeconomic History   Marital status: Married    Spouse name: Not on file   Number of children: Not on file   Years of education: Not on file   Highest education level: 12th grade  Occupational History   Occupation: Full time   Occupation: Naval architect  Tobacco Use   Smoking status: Never   Smokeless tobacco: Never  Vaping Use   Vaping status: Never Used  Substance and Sexual Activity   Alcohol use: No    Alcohol/week: 0.0 standard drinks of alcohol   Drug use: No   Sexual activity: Not on file  Other Topics Concern   Not on file  Social History Narrative   Not on file   Social Drivers of Health   Financial Resource Strain: Low Risk  (05/02/2023)   Overall Financial Resource Strain (CARDIA)    Difficulty of Paying Living Expenses: Not very hard  Food Insecurity: No Food Insecurity (12/11/2023)   Hunger Vital Sign    Worried About Running Out of Food in the Last Year: Never true    Ran Out of Food in the Last Year: Never true  Transportation Needs: No Transportation Needs (12/11/2023)   PRAPARE - Administrator, Civil Service (Medical): No    Lack of Transportation (Non-Medical): No  Physical Activity: Inactive (05/02/2023)   Exercise Vital Sign    Days of Exercise per Week: 1 day    Minutes of Exercise per Session: 0 min  Stress: No Stress Concern Present (08/29/2023)   Received from [facility] - Occupational Stress  Questionnaire    Feeling of Stress : Not at all  Social Connections: Moderately Isolated (05/02/2023)   Social Connection and Isolation Panel    Frequency of Communication with Friends and Family: Twice a week    Frequency of Social Gatherings with Friends and Family: Once a week    Attends Religious Services: Never    Database administrator or Organizations: No    Attends Engineer, structural: Not on file    Marital Status: Married  Catering manager Violence: Not At Risk (12/11/2023)   Humiliation, Afraid, Rape, and Kick questionnaire    Fear of Current or Ex-Partner: No    Emotionally Abused: No    Physically Abused: No    Sexually Abused: No    Family History: Family History  Problem Relation Age of Onset   Diabetes Mother    Scleroderma Sister    Diabetes Brother    Diabetes Brother    Colon cancer Other        family history    Prostate cancer Other        family history    Lung disease Neg Hx     Current Medications:  Current Outpatient Medications:    acetaminophen  (TYLENOL ) 500 MG tablet, Take by mouth., Disp: , Rfl:    amoxicillin  (AMOXIL ) 500 MG capsule, Take 500 mg by mouth as  needed (for dental procedures)., Disp: , Rfl:    celecoxib  (CELEBREX ) 100 MG capsule, Take 1 capsule (100 mg total) by mouth 2 (two) times daily., Disp: 180 capsule, Rfl: 3   Cinnamon 500 MG capsule, Take 1,000 mg by mouth daily. , Disp: , Rfl:    Multiple Vitamin (MULTIVITAMIN) tablet, Take 1 tablet by mouth daily., Disp: , Rfl:    nitroGLYCERIN  (NITROSTAT ) 0.4 MG SL tablet, Place 1 tablet (0.4 mg total) under the tongue every 5 (five) minutes as needed for chest pain., Disp: 90 tablet, Rfl: 3   Omega-3 Fatty Acids (FISH OIL ) 1000 MG CAPS, Take 1 capsule by mouth daily. , Disp: , Rfl:    omeprazole  (PRILOSEC) 20 MG capsule, Take 1 capsule (20 mg total) by mouth daily as needed (heart burn). Put on file, Disp: 100 capsule, Rfl: 3   ondansetron  (ZOFRAN -ODT) 4 MG disintegrating tablet,  Take 1 tablet (4 mg total) by mouth every 8 (eight) hours as needed for nausea or vomiting., Disp: 20 tablet, Rfl: 0   PARoxetine  (PAXIL -CR) 25 MG 24 hr tablet, Take 1 tablet (25 mg total) by mouth daily., Disp: 100 tablet, Rfl: 3   pravastatin  (PRAVACHOL ) 40 MG tablet, Take 1 tablet (40 mg total) by mouth at bedtime., Disp: 90 tablet, Rfl: 4   Semaglutide -Weight Management 0.25 MG/0.5ML SOAJ, Inject 0.25 mg into the skin once a week for 28 days., Disp: 2 mL, Rfl: 0   [START ON 01/02/2024] Semaglutide -Weight Management 0.5 MG/0.5ML SOAJ, Inject 0.5 mg into the skin once a week for 28 days., Disp: 2 mL, Rfl: 0   [START ON 01/31/2024] Semaglutide -Weight Management 1 MG/0.5ML SOAJ, Inject 1 mg into the skin once a week for 28 days., Disp: 2 mL, Rfl: 0   [START ON 02/29/2024] Semaglutide -Weight Management 1.7 MG/0.75ML SOAJ, Inject 1.7 mg into the skin once a week., Disp: 3 mL, Rfl: 12   [START ON 03/29/2024] Semaglutide -Weight Management 2.4 MG/0.75ML SOAJ, Inject 2.4 mg into the skin once a week., Disp: 3 mL, Rfl: 12   telmisartan  (MICARDIS ) 40 MG tablet, Take 1 tablet (40 mg total) by mouth daily., Disp: 100 tablet, Rfl: 3   triamterene -hydrochlorothiazide (MAXZIDE-25) 37.5-25 MG tablet, Take 1 tablet by mouth daily., Disp: 100 tablet, Rfl: 3   TURMERIC PO, Take 1 capsule by mouth daily. , Disp: , Rfl:    Allergies: Allergies  Allergen Reactions   Other Anaphylaxis    Make me hurt alot   Codeine     Unknown    Lovastatin     Makes me hurt alot   Niaspan [Niacin Er (Antihyperlipidemic)]     Make me hurt alot    REVIEW OF SYSTEMS:   Review of Systems  Constitutional:  Negative for chills, fatigue and fever.  HENT:   Negative for lump/mass, mouth sores, nosebleeds, sore throat and trouble swallowing.   Eyes:  Negative for eye problems.  Respiratory:  Negative for cough and shortness of breath.   Cardiovascular:  Negative for chest pain, leg swelling and palpitations.  Gastrointestinal:   Negative for abdominal pain, constipation, diarrhea, nausea and vomiting.  Genitourinary:  Negative for bladder incontinence, difficulty urinating, dysuria, frequency, hematuria and nocturia.   Musculoskeletal:  Negative for arthralgias, back pain, flank pain, myalgias and neck pain.  Skin:  Negative for itching and rash.  Neurological:  Negative for dizziness, headaches and numbness.  Hematological:  Does not bruise/bleed easily.  Psychiatric/Behavioral:  Negative for depression, sleep disturbance and suicidal ideas. The patient is not  nervous/anxious.   All other systems reviewed and are negative.    VITALS:   Blood pressure 106/69, pulse 63, temperature 97.8 F (36.6 C), temperature source Oral, resp. rate 18, height 5' 8 (1.727 Cline), weight 242 lb (109.8 kg), SpO2 99%.  Wt Readings from Last 3 Encounters:  12/11/23 242 lb (109.8 kg)  12/04/23 247 lb 9.6 oz (112.3 kg)  10/16/23 247 lb 6.4 oz (112.2 kg)    Body mass index is 36.8 kg/Cline.   PHYSICAL EXAM:   Physical Exam Vitals and nursing note reviewed. Exam conducted with a chaperone present.  Constitutional:      Appearance: Normal appearance.  Cardiovascular:     Rate and Rhythm: Normal rate and regular rhythm.     Pulses: Normal pulses.     Heart sounds: Normal heart sounds.  Pulmonary:     Effort: Pulmonary effort is normal.     Breath sounds: Normal breath sounds.  Abdominal:     Palpations: Abdomen is soft. There is no hepatomegaly, splenomegaly or mass.     Tenderness: There is no abdominal tenderness.  Musculoskeletal:     Right lower leg: No edema.     Left lower leg: No edema.  Lymphadenopathy:     Cervical: No cervical adenopathy.     Right cervical: No superficial, deep or posterior cervical adenopathy.    Left cervical: No superficial, deep or posterior cervical adenopathy.     Upper Body:     Right upper body: No supraclavicular or axillary adenopathy.     Left upper body: No supraclavicular or axillary  adenopathy.  Neurological:     General: No focal deficit present.     Mental Status: He is alert and oriented to person, place, and time.  Psychiatric:        Mood and Affect: Mood normal.        Behavior: Behavior normal.     LABS:   CBC    Component Value Date/Time   WBC 6.0 12/11/2023 0850   RBC 4.92 12/11/2023 0851   RBC 4.90 12/11/2023 0850   HGB 13.8 12/11/2023 0850   HGB 11.7 (L) 10/31/2023 0900   HCT 43.2 12/11/2023 0850   HCT 37.4 (L) 10/31/2023 0900   PLT 151 12/11/2023 0850   PLT 175 10/31/2023 0900   MCV 88.2 12/11/2023 0850   MCV 90 10/31/2023 0900   MCH 28.2 12/11/2023 0850   MCHC 31.9 12/11/2023 0850   RDW 15.0 12/11/2023 0850   RDW 15.8 (H) 10/31/2023 0900   LYMPHSABS 1.2 12/11/2023 0850   LYMPHSABS 1.4 10/31/2023 0900   MONOABS 0.6 12/11/2023 0850   EOSABS 0.1 12/11/2023 0850   EOSABS 0.1 10/31/2023 0900   BASOSABS 0.0 12/11/2023 0850   BASOSABS 0.1 10/31/2023 0900    CMP    Component Value Date/Time   NA 139 10/16/2023 0947   K 3.9 10/16/2023 0947   CL 100 10/16/2023 0947   CO2 22 10/16/2023 0947   GLUCOSE 93 10/16/2023 0947   GLUCOSE 95 04/19/2013 1956   BUN 26 10/16/2023 0947   CREATININE 0.65 (L) 10/16/2023 0947   CALCIUM  9.0 10/16/2023 0947   PROT 6.7 10/16/2023 0947   ALBUMIN 4.1 10/16/2023 0947   AST 18 10/16/2023 0947   ALT 22 10/16/2023 0947   ALKPHOS 97 10/16/2023 0947   BILITOT 0.3 10/16/2023 0947   GFRNONAA 83 11/18/2019 0801   GFRAA 96 11/18/2019 0801    No results found for: CEA1, CEA / No results found  for: CEA1, CEA Lab Results  Component Value Date   PSA1 0.6 10/16/2023   No results found for: CAN199 No results found for: CAN125  No results found for: TOTALPROTELP, ALBUMINELP, A1GS, A2GS, BETS, BETA2SER, GAMS, MSPIKE, SPEI Lab Results  Component Value Date   TIBC 469 (H) 12/11/2023   TIBC 308 10/16/2023   FERRITIN 14 (L) 12/11/2023   FERRITIN 336 10/16/2023   IRONPCTSAT 9 (L)  12/11/2023   IRONPCTSAT 55 10/16/2023   Lab Results  Component Value Date   LDH 169 12/11/2023     STUDIES:   No results found.

## 2023-12-11 NOTE — Patient Instructions (Signed)
[facility]  Discharge Instructions  You were seen and examined today by Dr. Katragadda. Dr. Katragadda is a hematologist, meaning that he specializes in blood abnormalities. Dr. Katragadda discussed your past medical history, family history of cancers/blood conditions and the events that led to you being here today.  You were referred to Dr. Katragadda due to anemia.  Dr. Katragadda has recommended additional labs today for further evaluation.  Follow-up as scheduled.  Thank you for choosing [facility] to provide your oncology and hematology care.   To afford each patient quality time with our provider, please arrive at least 15 minutes before your scheduled appointment time. You may need to reschedule your appointment if you arrive late (10 or more minutes). Arriving late affects you and other patients whose appointments are after yours.  Also, if you miss three or more appointments without notifying the office, you may be dismissed from the clinic at the provider's discretion.    Again, thank you for choosing [facility].  Our hope is that these requests will decrease the amount of time that you wait before being seen by our physicians.   If you have a lab appointment with the Cancer Center - please note that after April 8th, all labs will be drawn in the cancer center.  You do not have to check in or register with the main entrance as you have in the past but will complete your check-in at the cancer center.            _____________________________________________________________  Should you have questions after your visit to  Cancer Center, please contact our office at [phone] and follow the prompts.  Our office hours are 8:00 a.m. to 4:30 p.m. Monday - Thursday and 8:00 a.m. to 2:30 p.m. Friday.  Please note that voicemails left after 4:00 p.m. may not be returned until the following business day.  We are  closed weekends and all major holidays.  You do have access to a nurse 24-7, just call the main number to the clinic [phone] and do not press any options, hold on the line and a nurse will answer the phone.    For prescription refill requests, have your pharmacy contact our office and allow 72 hours.    Masks are no longer required in the cancer centers. If you would like for your care team to wear a mask while they are taking care of you, please let them know. You may have one support person who is at least 65 years old accompany you for your appointments.  

## 2023-12-12 ENCOUNTER — Encounter: Payer: Self-pay | Admitting: Physician Assistant

## 2023-12-12 LAB — KAPPA/LAMBDA LIGHT CHAINS
Kappa free light chain: 28 mg/L — ABNORMAL HIGH (ref 3.3–19.4)
Kappa, lambda light chain ratio: 1.27 (ref 0.26–1.65)
Lambda free light chains: 22 mg/L (ref 5.7–26.3)

## 2023-12-12 LAB — MISC LABCORP TEST (SEND OUT): Labcorp test code: 121251

## 2023-12-12 LAB — ERYTHROPOIETIN: Erythropoietin: 20 m[IU]/mL — ABNORMAL HIGH (ref 2.6–18.5)

## 2023-12-13 LAB — PROTEIN ELECTROPHORESIS, SERUM
A/G Ratio: 1.3 (ref 0.7–1.7)
Albumin ELP: 3.9 g/dL (ref 2.9–4.4)
Alpha-1-Globulin: 0.2 g/dL (ref 0.0–0.4)
Alpha-2-Globulin: 0.7 g/dL (ref 0.4–1.0)
Beta Globulin: 1.1 g/dL (ref 0.7–1.3)
Gamma Globulin: 1.1 g/dL (ref 0.4–1.8)
Globulin, Total: 3.1 g/dL (ref 2.2–3.9)
Total Protein ELP: 7 g/dL (ref 6.0–8.5)

## 2023-12-13 LAB — COPPER, SERUM: Copper: 142 ug/dL — ABNORMAL HIGH (ref 69–132)

## 2023-12-16 LAB — IMMUNOFIXATION ELECTROPHORESIS
IgA: 244 mg/dL (ref 61–437)
IgG (Immunoglobin G), Serum: 1141 mg/dL (ref 603–1613)
IgM (Immunoglobulin M), Srm: 132 mg/dL (ref 20–172)
Total Protein ELP: 7.2 g/dL (ref 6.0–8.5)

## 2023-12-19 LAB — METHYLMALONIC ACID, SERUM: Methylmalonic Acid, Quantitative: 257 nmol/L (ref 0–378)

## 2023-12-27 ENCOUNTER — Other Ambulatory Visit: Payer: Self-pay | Admitting: Family Medicine

## 2023-12-27 DIAGNOSIS — R7303 Prediabetes: Secondary | ICD-10-CM

## 2023-12-27 DIAGNOSIS — G4733 Obstructive sleep apnea (adult) (pediatric): Secondary | ICD-10-CM

## 2023-12-27 DIAGNOSIS — I700 Atherosclerosis of aorta: Secondary | ICD-10-CM

## 2023-12-27 DIAGNOSIS — Z6836 Body mass index (BMI) 36.0-36.9, adult: Secondary | ICD-10-CM

## 2024-01-07 ENCOUNTER — Other Ambulatory Visit: Payer: Self-pay | Admitting: Family Medicine

## 2024-01-07 DIAGNOSIS — K219 Gastro-esophageal reflux disease without esophagitis: Secondary | ICD-10-CM

## 2024-01-09 LAB — INTELLIGEN MYELOID

## 2024-01-12 ENCOUNTER — Telehealth: Payer: Self-pay | Admitting: *Deleted

## 2024-01-12 NOTE — Telephone Encounter (Signed)
 Copied from CRM [id number]. Topic: Clinical - Medication Question >> Jan 12, 2024 12:04 PM Yolanda T wrote: Reason for CRM: patient called to find out what dosage of the Semaglutide -Weight Management Wegovy  he should be on. Please f/u with patient

## 2024-01-12 NOTE — Telephone Encounter (Signed)
 Pt is on his last dose of 0.25 mg of Wegovy , he should be going to the 0.5 mg dose next for 28 days.

## 2024-01-22 ENCOUNTER — Ambulatory Visit (HOSPITAL_COMMUNITY)
Admission: RE | Admit: 2024-01-22 | Discharge: 2024-01-22 | Disposition: A | Discharge status: Home / Self Care | Source: Ambulatory Visit | Attending: Physician Assistant | Admitting: Physician Assistant

## 2024-01-22 DIAGNOSIS — R0602 Shortness of breath: Secondary | ICD-10-CM | POA: Insufficient documentation

## 2024-01-22 DIAGNOSIS — R600 Localized edema: Secondary | ICD-10-CM | POA: Insufficient documentation

## 2024-01-22 LAB — ECHOCARDIOGRAM COMPLETE
Area-P 1/2: 3.68 cm2
S' Lateral: 3.3 cm

## 2024-01-23 ENCOUNTER — Ambulatory Visit: Payer: Self-pay | Admitting: Physician Assistant

## 2024-02-03 NOTE — Progress Notes (Unsigned)
 [facility] [address], [place] [address]   CLINIC:  Medical Oncology/Hematology  PCP:  Jolinda Norene HERO, DO [address] [place] [address] [phone]   REASON FOR VISIT:  Follow-up for pancytopenia (resolved)  CURRENT THERAPY: Under workup  INTERVAL HISTORY:   Jacob Cline 63 y.o. male returns for routine follow-up of pancytopenia. He was seen for initial consultation by Dr. Rogers on 12/11/2023 and returns today to discuss results of that workup.  At today's visit, he reports feeling fairly well.   No recent hospitalizations, surgeries, or changes in baseline health status.  No rectal bleeding or melena. He reports feeling tired, in part due to working 65-70 hours per week. He denies any ice pica. No headaches, lightheadedness, syncope, chest pain, dyspnea on exertion. He did not take any iron supplementation after his surgeries.  He has 40% energy and 100% appetite. He endorses that he is maintaining a stable weight.   ASSESSMENT & PLAN:  1.  Normocytic anemia, RESOLVED - Labs from PCP on 10/16/2023 showed low Hgb 11.8, low reticulocyte count percent at < 0.2.  Ferritin was 336, iron saturation 55%, B12 422, folate 8.7) - Most recent colonoscopy on 05/08/2015 found no major abnormalities - FOBT (11/02/2020) was NEGATIVE.  Denies BRBPR/melena. - He had multiple surgeries in the months preceding his anemia with left TKR (April 2025), right TKR (December 2024), and left hip replacement (August 2024).  He was not placed on any iron supplement postoperatively. - Labs from PCP in June 2025 showed normal B12 and folate - Hematology workup showed iron deficiency (ferritin 14, iron saturation 9%, elevated TIBC 469), but anemia had resolved.  Additional workup as described below was UNREMARKABLE. Normal kidney function (Cystatin C 1.13, normal creatinine when checked by PCP in June 2025) EPO appropriately elevated at 20.0.  LDH normal.  DAT negative.   Reticulocytes 1.3% (hypoproliferative) Normal copper .  Normal MMA. Immunofixation and SPEP negative.  Minimally elevated kappa light chains 28.0, with normal FLC ratio 1.27.  - NGS myeloid panel is PENDING  - DIFFERENTIAL DIAGNOSIS favors iron deficiency anemia from multiple recent surgeries - PLAN: Anemia has resolved, but he has persistent iron deficiency, most likely residual from multiple recent surgeries.   - Will follow results of NGS myeloid panel. - Recommend starting ferrous sulfate 325 mg every other day.   - Repeat CBC/D with nutritional panel in 4 months.  If ferritin improved and no recurrent cytopenias, will discharge to PCP at that time.    2.  Thrombocytopenia and neutropenia, RESOLVED  - Labs from PCP on 10/16/2023 showed low platelets 131, low ANC 1.2 - CBC/D from 10/31/2023 showed resolved thrombocytopenia with platelets 175 - MR abdomen (08/09/2021) showed hepatic steatosis, normal spleen. - Labs from June 2025 (by PCP) showed normal folate, B12.  Negative for hepatitis C and HIV. - Most recent CBC/D (12/11/2023) shows normal WBC, normal platelets, normal differential - PLAN: Platelets overall have been on the lower side of normal for several years, with isolated low platelets at 131 on 10/16/2023.  Platelets may tend to be on the lower side related to his fatty liver disease. - Will follow results of NGS myeloid panel.   - Repeat CBC/D with nutritional panel in 4 months. - If no major recurrent thrombocytopenia (platelets <100), patient would be stable for discharge to PCP at next visit.  3.  Social/family history: - Lives at home with his wife.  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  Non-smoker. -  No family history of significant anemia.  Does not know father side of the family.  Maternal aunt and maternal uncle had lung cancers.  2 maternal cousins had lung cancer.  1 maternal cousin had colon cancer.  PLAN SUMMARY: >> Labs in 4 months = CBC/D, ferritin, iron/TIBC, B12, MMA,  folate >> OFFICE visit in 4 months (1 week after labs)     REVIEW OF SYSTEMS:   Review of Systems  Constitutional:  Positive for fatigue. Negative for appetite change, chills, diaphoresis, fever and unexpected weight change.  HENT:   Negative for lump/mass and nosebleeds.   Eyes:  Negative for eye problems.  Respiratory:  Negative for cough, hemoptysis and shortness of breath.   Cardiovascular:  Negative for chest pain, leg swelling and palpitations.  Gastrointestinal:  Negative for abdominal pain, blood in stool, constipation, diarrhea, nausea and vomiting.  Genitourinary:  Positive for difficulty urinating (weak flow). Negative for hematuria.   Skin: Negative.   Neurological:  Negative for dizziness, headaches and light-headedness.  Hematological:  Does not bruise/bleed easily.     PHYSICAL EXAM:  ECOG PERFORMANCE STATUS: 1 - Symptomatic but completely ambulatory  Vitals:   02/05/24 0842  BP: 111/71  Pulse: 61  Resp: 18  Temp: 97.8 F (36.6 C)  SpO2: 94%   Filed Weights   02/05/24 0840 02/05/24 0842  Weight: 241 lb 11.2 oz (109.6 kg) 241 lb 11.2 oz (109.6 kg)   Physical Exam Constitutional:      Appearance: Normal appearance. He is obese.  Cardiovascular:     Heart sounds: Normal heart sounds.  Pulmonary:     Breath sounds: Normal breath sounds.  Neurological:     General: No focal deficit present.     Mental Status: Mental status is at baseline.  Psychiatric:        Behavior: Behavior normal. Behavior is cooperative.     PAST MEDICAL/SURGICAL HISTORY:  Past Medical History:  Diagnosis Date   Aortic aneurysm    Back pain    BPH (benign prostatic hypertrophy)    Cyst of left kidney    Edema of both lower extremities    GERD (gastroesophageal reflux disease)    Hemorrhoids    Hiatal hernia    Hyperlipidemia    Hypertension    Irregular heart beat    Joint pain    Obesity    Post-operative nausea and vomiting    Vitamin D  deficiency    Past Surgical  History:  Procedure Laterality Date   athroscopy of rt knee  02/02   henia - umbilical surgery  07/17/07   HERNIA REPAIR  05/15/2017   double hernia surgery   HIP SURGERY  11/2014   right hip   MOUTH SURGERY     VASECTOMY      SOCIAL HISTORY:  Social History   Socioeconomic History   Marital status: Married    Spouse name: Not on file   Number of children: Not on file   Years of education: Not on file   Highest education level: 12th grade  Occupational History   Occupation: Full time   Occupation: Naval architect  Tobacco Use   Smoking status: Never   Smokeless tobacco: Never  Vaping Use   Vaping status: Never Used  Substance and Sexual Activity   Alcohol use: No    Alcohol/week: 0.0 standard drinks of alcohol   Drug use: No   Sexual activity: Not on file  Other Topics Concern   Not on file  Social History Narrative   Not on file   Social Drivers of Health   Financial Resource Strain: Low Risk  (05/02/2023)   Overall Financial Resource Strain (CARDIA)    Difficulty of Paying Living Expenses: Not very hard  Food Insecurity: No Food Insecurity (12/11/2023)   Hunger Vital Sign    Worried About Running Out of Food in the Last Year: Never true    Ran Out of Food in the Last Year: Never true  Transportation Needs: No Transportation Needs (12/11/2023)   PRAPARE - Administrator, Civil Service (Medical): No    Lack of Transportation (Non-Medical): No  Physical Activity: Inactive (05/02/2023)   Exercise Vital Sign    Days of Exercise per Week: 1 day    Minutes of Exercise per Session: 0 min  Stress: No Stress Concern Present (08/29/2023)   Received from [facility] of Occupational Health - Occupational Stress Questionnaire    Feeling of Stress : Not at all  Social Connections: Moderately Isolated (05/02/2023)   Social Connection and Isolation Panel    Frequency of Communication with Friends and Family: Twice a week    Frequency of Social  Gatherings with Friends and Family: Once a week    Attends Religious Services: Never    Database administrator or Organizations: No    Attends Engineer, structural: Not on file    Marital Status: Married  Catering manager Violence: Not At Risk (12/11/2023)   Humiliation, Afraid, Rape, and Kick questionnaire    Fear of Current or Ex-Partner: No    Emotionally Abused: No    Physically Abused: No    Sexually Abused: No    FAMILY HISTORY:  Family History  Problem Relation Age of Onset   Diabetes Mother    Scleroderma Sister    Diabetes Brother    Diabetes Brother    Colon cancer Other        family history    Prostate cancer Other        family history    Lung disease Neg Hx     CURRENT MEDICATIONS:  Outpatient Encounter Medications as of 02/05/2024  Medication Sig   acetaminophen  (TYLENOL ) 500 MG tablet Take by mouth.   amoxicillin  (AMOXIL ) 500 MG capsule Take 500 mg by mouth as needed (for dental procedures).   celecoxib  (CELEBREX ) 100 MG capsule Take 1 capsule (100 mg total) by mouth 2 (two) times daily.   Cinnamon 500 MG capsule Take 1,000 mg by mouth daily.    ferrous sulfate 325 (65 FE) MG EC tablet Take 1 tablet (325 mg total) by mouth every other day.   Multiple Vitamin (MULTIVITAMIN) tablet Take 1 tablet by mouth daily.   nitroGLYCERIN  (NITROSTAT ) 0.4 MG SL tablet Place 1 tablet (0.4 mg total) under the tongue every 5 (five) minutes as needed for chest pain.   Omega-3 Fatty Acids (FISH OIL ) 1000 MG CAPS Take 1 capsule by mouth daily.    omeprazole  (PRILOSEC) 20 MG capsule TAKE 1 CAPSULE BY MOUTH TWICE DAILY BEFORE MEAL(S)   ondansetron  (ZOFRAN -ODT) 4 MG disintegrating tablet Take 1 tablet (4 mg total) by mouth every 8 (eight) hours as needed for nausea or vomiting.   PARoxetine  (PAXIL -CR) 25 MG 24 hr tablet Take 1 tablet (25 mg total) by mouth daily.   pravastatin  (PRAVACHOL ) 40 MG tablet Take 1 tablet (40 mg total) by mouth at bedtime.   Semaglutide -Weight  Management 1 MG/0.5ML SOAJ Inject 1 mg  into the skin once a week for 28 days.   [START ON 02/29/2024] Semaglutide -Weight Management 1.7 MG/0.75ML SOAJ Inject 1.7 mg into the skin once a week.   [START ON 03/29/2024] Semaglutide -Weight Management 2.4 MG/0.75ML SOAJ Inject 2.4 mg into the skin once a week.   telmisartan  (MICARDIS ) 40 MG tablet Take 1 tablet (40 mg total) by mouth daily.   triamterene -hydrochlorothiazide (MAXZIDE-25) 37.5-25 MG tablet Take 1 tablet by mouth daily.   TURMERIC PO Take 1 capsule by mouth daily.    WEGOVY  0.5 MG/0.5ML SOAJ SQ injection Inject 0.5 mg into the skin once a week.   No facility-administered encounter medications on file as of 02/05/2024.    ALLERGIES:  Allergies  Allergen Reactions   Other Anaphylaxis    Make me hurt alot   Codeine     Unknown    Lovastatin     Makes me hurt alot   Niaspan [Niacin Er (Antihyperlipidemic)]     Make me hurt alot    LABORATORY DATA:  I have reviewed the labs as listed.  CBC    Component Value Date/Time   WBC 6.0 12/11/2023 0850   RBC 4.92 12/11/2023 0851   RBC 4.90 12/11/2023 0850   HGB 13.8 12/11/2023 0850   HGB 11.7 (L) 10/31/2023 0900   HCT 43.2 12/11/2023 0850   HCT 37.4 (L) 10/31/2023 0900   PLT 151 12/11/2023 0850   PLT 175 10/31/2023 0900   MCV 88.2 12/11/2023 0850   MCV 90 10/31/2023 0900   MCH 28.2 12/11/2023 0850   MCHC 31.9 12/11/2023 0850   RDW 15.0 12/11/2023 0850   RDW 15.8 (H) 10/31/2023 0900   LYMPHSABS 1.2 12/11/2023 0850   LYMPHSABS 1.4 10/31/2023 0900   MONOABS 0.6 12/11/2023 0850   EOSABS 0.1 12/11/2023 0850   EOSABS 0.1 10/31/2023 0900   BASOSABS 0.0 12/11/2023 0850   BASOSABS 0.1 10/31/2023 0900      Latest Ref Rng & Units 10/16/2023    9:47 AM 07/24/2023    9:36 AM 04/10/2023    9:09 AM  CMP  Glucose 70 - 99 mg/dL 93  897  94   BUN 8 - 27 mg/dL 26  22  32   Creatinine 0.76 - 1.27 mg/dL 9.34  9.28  9.18   Sodium 134 - 144 mmol/L 139  141  145   Potassium 3.5 - 5.2  mmol/L 3.9  4.1  4.8   Chloride 96 - 106 mmol/L 100  101  103   CO2 20 - 29 mmol/L 22  25  26    Calcium  8.6 - 10.2 mg/dL 9.0  9.2  9.4   Total Protein 6.0 - 8.5 g/dL 6.7  6.6  6.8   Total Bilirubin 0.0 - 1.2 mg/dL 0.3  0.3  0.2   Alkaline Phos 44 - 121 IU/L 97  108  122   AST 0 - 40 IU/L 18  16  19    ALT 0 - 44 IU/L 22  20  30      DIAGNOSTIC IMAGING:  I have independently reviewed the relevant imaging and discussed with the patient.   WRAP UP:  All questions were answered. The patient knows to call the clinic with any problems, questions or concerns.  Medical decision making: Moderate  Time spent on visit: I spent 20 minutes counseling the patient face to face. The total time spent in the appointment was 30 minutes and more than 50% was on counseling.  Pleasant CHRISTELLA Barefoot, PA-C  02/05/24 9:28  AM

## 2024-02-05 ENCOUNTER — Inpatient Hospital Stay: Attending: Physician Assistant | Admitting: Physician Assistant

## 2024-02-05 VITALS — BP 111/71 | HR 61 | Temp 97.8°F | Resp 18 | Wt 241.7 lb

## 2024-02-05 DIAGNOSIS — D61818 Other pancytopenia: Secondary | ICD-10-CM | POA: Insufficient documentation

## 2024-02-05 DIAGNOSIS — K760 Fatty (change of) liver, not elsewhere classified: Secondary | ICD-10-CM | POA: Diagnosis not present

## 2024-02-05 DIAGNOSIS — D500 Iron deficiency anemia secondary to blood loss (chronic): Secondary | ICD-10-CM

## 2024-02-05 DIAGNOSIS — R5383 Other fatigue: Secondary | ICD-10-CM | POA: Insufficient documentation

## 2024-02-05 DIAGNOSIS — Z800 Family history of malignant neoplasm of digestive organs: Secondary | ICD-10-CM | POA: Diagnosis not present

## 2024-02-05 DIAGNOSIS — Z801 Family history of malignant neoplasm of trachea, bronchus and lung: Secondary | ICD-10-CM | POA: Insufficient documentation

## 2024-02-05 MED ORDER — FERROUS SULFATE 325 (65 FE) MG PO TBEC: 325.0000 mg | DELAYED_RELEASE_TABLET | ORAL | 0 refills | Status: AC

## 2024-02-05 NOTE — Patient Instructions (Signed)
 [facility] **VISIT SUMMARY & IMPORTANT INSTRUCTIONS **   You were seen today by Pleasant Barefoot PA-C for your anemia.   Your anemia was most likely related to blood loss from your multiple surgeries over the past years. Your anemia has resolved on it's own, but your body had to use up lots of iron to make those new blood cells.  This has left you with some iron deficiency. Start taking iron tablet every other day. Take over-the-counter ferrous sulfate 325 mg every other day. You need to take omeprazole  at a different time of day than your iron.  Since you take omeprazole  in the morning and evening, take your iron pill at lunch. Take your iron pill with a glass of orange juice to help your body absorb it better. Take iron with food to avoid upset stomach. If you experience any constipation from your iron tablet, try taking over the stool softener such as Colace once daily.  Drink plenty of water.  Eat a high fiber diet and consider taking a fiber supplement.  FOLLOW-UP APPOINTMENT: 4 months  ** Thank you for trusting me with your healthcare!  I strive to provide all of my patients with quality care at each visit.  If you receive a survey for this visit, I would be so grateful to you for taking the time to provide feedback.  Thank you in advance!  ~ Symphanie Cederberg                                        Dr. Mickiel Davonna Pleasant Barefoot, PA-C          Delon Hope, NP   - - - - - - - - - - - - - - - - - -    Thank you for choosing [facility] at [facility] to provide your oncology and hematology care.  To afford each patient quality time with our provider, please arrive at least 15 minutes before your scheduled appointment time.   If you have a lab appointment with the Cancer Center please come in thru the Main Entrance and check in at the main information desk.  You need to re-schedule your appointment should you arrive 10  or more minutes late.  We strive to give you quality time with our providers, and arriving late affects you and other patients whose appointments are after yours.  Also, if you no show three or more times for appointments you may be dismissed from the clinic at the providers discretion.     Again, thank you for choosing [facility].  Our hope is that these requests will decrease the amount of time that you wait before being seen by our physicians.       _____________________________________________________________  Should you have questions after your visit to [facility], please contact our office at [phone] and follow the prompts.  Our office hours are 8:00 a.m. and 4:30 p.m. Monday - Friday.  Please note that voicemails left after 4:00 p.m. may not be returned until the following business day.  We are closed weekends and major holidays.  You do have access to a nurse 24-7, just call the main number to the clinic [phone] and do not press any options, hold on the line and a nurse  will answer the phone.    For prescription refill requests, have your pharmacy contact our office and allow 72 hours.

## 2024-02-12 ENCOUNTER — Ambulatory Visit: Admitting: Physician Assistant

## 2024-02-12 ENCOUNTER — Encounter: Payer: Self-pay | Admitting: Physician Assistant

## 2024-02-12 VITALS — BP 98/58 | HR 67 | Ht 68.0 in | Wt 245.4 lb

## 2024-02-12 DIAGNOSIS — D509 Iron deficiency anemia, unspecified: Secondary | ICD-10-CM

## 2024-02-12 DIAGNOSIS — K449 Diaphragmatic hernia without obstruction or gangrene: Secondary | ICD-10-CM

## 2024-02-12 DIAGNOSIS — K219 Gastro-esophageal reflux disease without esophagitis: Secondary | ICD-10-CM

## 2024-02-12 MED ORDER — NA SULFATE-K SULFATE-MG SULF 17.5-3.13-1.6 GM/177ML PO SOLN: 1.0000 | Freq: Once | ORAL | 0 refills | Status: AC

## 2024-02-12 NOTE — Progress Notes (Signed)
 Ellouise Console, PA-C [address] [place], [place]  [address] Phone: [phone]   Gastroenterology Consultation  Referring Provider:     Jolinda Norene HERO, DO Primary Care Physician:  Jolinda Norene HERO, DO Primary Gastroenterologist:  Ellouise Console, PA-C / Dr. Gordy Starch  Reason for Consultation:     Iron deficiency anemia        HPI:   Jacob Cline is a 63 y.o. y/o male referred for consultation & management  by Jolinda Norene HERO, DO.  Here to evaluate iron deficiency anemia.  Current GI symptoms: History of chronic GERD takes Prilosec 20 mg twice daily.  Takes iron tablet daily (started last week) and Celebrex  daily.  He was switched from diclofenac  to Celebrex  6 months ago.  Chronic NSAID use.  Stopped aspirin 1 year ago.  Is on Wegovy  to help with weight loss.  - Patient seen at the request of Dr. Jolinda - CBC on 10/16/2023 and 10/31/2023 showed hemoglobin 11.7 with MCV 90. - Recent FOBT (11/02/2020): Negative. - Denies BRBPR/melena. No prior history of transfusions. - Recent surgeries include left TKR (April 2025), right TKR (December 2024), left hip replacement (August 2024). - Has occasional mild epigastric tenderness, heartburn, and dysphagia.  Denies lower GI symptoms such as diarrhea, constipation, melena, or hematochezia.  04/2015 last colonoscopy by Dr. Starch: Normal.  No polyps.  10-year repeat.  2006 last EGD by Dr. Jakie: 3 cm hiatal hernia, otherwise normal.  PMH: CAD, hypertension, thoracic aortic aneurysm, hyperlipidemia, chronic diastolic heart failure, OSA, and vitamin D  deficiency.  01/2024 echo LVEF 60 to 65%.   Component Ref Range & Units (hover) 2 mo ago (12/11/23) 3 mo ago (10/31/23) 3 mo ago (10/16/23) 1 yr ago (10/21/22) 1 yr ago (10/19/22)  WBC 6.0 5.8 R, CM 3.6 R 8.7 R 11.3 High  R  RBC 4.90 4.18 R, CM 4.25 R 4.46 R 4.39 R  Hemoglobin 13.8 11.7 Low  R 11.8 Low  R 14.0 R 13.3 R  HCT 43.2 37.4 Low  R 36.8 Low  R 41.9 R 40.1 R  MCV  88.2 90 R 87 R 94        Component Ref Range & Units (hover) 2 mo ago 3 mo ago  Iron 40 Low  168 R  TIBC 469 High  308  Saturation Ratios 9 Low    UIBC 429 140 R   Past Medical History:  Diagnosis Date   Aortic aneurysm    Back pain    BPH (benign prostatic hypertrophy)    Cyst of left kidney    Edema of both lower extremities    GERD (gastroesophageal reflux disease)    Hemorrhoids    Hiatal hernia    Hyperlipidemia    Hypertension    IDA (iron deficiency anemia)    Irregular heart beat    Joint pain    Obesity    Post-operative nausea and vomiting    Vitamin D  deficiency     Past Surgical History:  Procedure Laterality Date   athroscopy of rt knee  06/2000   henia - umbilical surgery  07/17/2007   HERNIA REPAIR  05/15/2017   double hernia surgery   HIP SURGERY Bilateral 11/2014   right hip 2016, left hip 2024   MOUTH SURGERY     VASECTOMY     WRIST FRACTURE SURGERY Left 2023    Prior to Admission medications   Medication Sig Start Date End Date Taking? Authorizing  Provider  acetaminophen  (TYLENOL ) 500 MG tablet Take by mouth.    [provider]  amoxicillin  (AMOXIL ) 500 MG capsule Take 500 mg by mouth as needed (for dental procedures). 08/07/23   [provider]  celecoxib  (CELEBREX ) 100 MG capsule Take 1 capsule (100 mg total) by mouth 2 (two) times daily. 07/26/23   Jolinda Norene HERO, DO  Cinnamon 500 MG capsule Take 1,000 mg by mouth daily.     [provider]  ferrous sulfate 325 (65 FE) MG EC tablet Take 1 tablet (325 mg total) by mouth every other day. 02/05/24   Lamon Pleasant HERO, PA-C  Multiple Vitamin (MULTIVITAMIN) tablet Take 1 tablet by mouth daily.    [provider]  nitroGLYCERIN  (NITROSTAT ) 0.4 MG SL tablet Place 1 tablet (0.4 mg total) under the tongue every 5 (five) minutes as needed for chest pain. 12/04/23 03/03/24  Lucien Orren SAILOR, PA-C  Omega-3 Fatty Acids (FISH OIL ) 1000 MG CAPS Take 1 capsule by mouth  daily.     [provider]  omeprazole  (PRILOSEC) 20 MG capsule TAKE 1 CAPSULE BY MOUTH TWICE DAILY BEFORE MEAL(S) 01/09/24   Jolinda Norene M, DO  ondansetron  (ZOFRAN -ODT) 4 MG disintegrating tablet Take 1 tablet (4 mg total) by mouth every 8 (eight) hours as needed for nausea or vomiting. 10/18/23   Jolinda Norene M, DO  PARoxetine  (PAXIL -CR) 25 MG 24 hr tablet Take 1 tablet (25 mg total) by mouth daily. 10/16/23   Jolinda Norene HERO, DO  pravastatin  (PRAVACHOL ) 40 MG tablet Take 1 tablet (40 mg total) by mouth at bedtime. 12/04/23   Lucien Orren SAILOR, PA-C  Semaglutide -Weight Management 1 MG/0.5ML SOAJ Inject 1 mg into the skin once a week for 28 days. 01/31/24 02/28/24  Jolinda Norene HERO, DO  Semaglutide -Weight Management 1.7 MG/0.75ML SOAJ Inject 1.7 mg into the skin once a week. 02/29/24   Jolinda Norene HERO, DO  Semaglutide -Weight Management 2.4 MG/0.75ML SOAJ Inject 2.4 mg into the skin once a week. 03/29/24   Jolinda Norene HERO, DO  telmisartan  (MICARDIS ) 40 MG tablet Take 1 tablet (40 mg total) by mouth daily. 12/04/23   Lucien Orren SAILOR, PA-C  triamterene -hydrochlorothiazide (MAXZIDE-25) 37.5-25 MG tablet Take 1 tablet by mouth daily. 12/04/23   Conte, Tessa N, PA-C  TURMERIC PO Take 1 capsule by mouth daily.     [provider]  WEGOVY  0.5 MG/0.5ML SOAJ SQ injection Inject 0.5 mg into the skin once a week. 01/11/24   [provider]    Family History  Problem Relation Age of Onset   Diabetes Mother    Scleroderma Sister    Diabetes Brother    Diabetes Brother    Colon cancer Other        family history    Prostate cancer Other        family history    Lung disease Neg Hx      Social History   Tobacco Use   Smoking status: Never   Smokeless tobacco: Never  Vaping Use   Vaping status: Never Used  Substance Use Topics   Alcohol use: Yes    Comment: socially   Drug use: No    Allergies as of 02/12/2024 - Review Complete 02/12/2024  Allergen  Reaction Noted   Other Anaphylaxis 01/30/2013   Codeine  01/30/2013   Lovastatin  01/30/2013   Niaspan [niacin er (antihyperlipidemic)]  01/30/2013    Review of Systems:    All systems reviewed and negative except where  noted in HPI.   Physical Exam:  BP (!) 98/58   Pulse 67   Ht 5' 8 (1.727 m)   Wt 245 lb 6.4 oz (111.3 kg)   SpO2 96%   BMI 37.31 kg/m  No LMP for male patient.  General:   Alert,  Well-developed, well-nourished, pleasant and cooperative in NAD Lungs:  Respirations even and unlabored.  Clear throughout to auscultation.   No wheezes, crackles, or rhonchi. No acute distress. Heart:  Regular rate and rhythm; no murmurs, clicks, rubs, or gallops. Abdomen:  Normal bowel sounds.  No bruits.  Soft, and non-distended without masses, hepatosplenomegaly or hernias noted.  No Tenderness.  No guarding or rebound tenderness.    Neurologic:  Alert and oriented x3;  grossly normal neurologically. Psych:  Alert and cooperative. Normal mood and affect.  Imaging Studies: ECHOCARDIOGRAM COMPLETE Result Date: 01/22/2024    ECHOCARDIOGRAM REPORT   Patient Name:   Jacob Cline Date of Exam: 01/22/2024 Medical Rec #:  [id number]      Height:       68.0 in Accession #:    [id number]     Weight:       242.0 lb Date of Birth:  Feb 13, 1961      BSA:          2.216 m Patient Age:    63 years       BP:           118/68 mmHg Patient Gender: M              HR:           58 bpm. Exam Location:  [address] Procedure: 2D Echo, 3D Echo, Cardiac Doppler and Color Doppler (Both Spectral            and Color Flow Doppler were utilized during procedure). Indications:    R06.09 SOB  History:        Patient has prior history of Echocardiogram examinations, most                 recent 04/30/2018. AAA, Signs/Symptoms:Chest Pain; Risk                 Factors:Hypertension and HLD.  Sonographer:    Waldo Guadalajara RCS Referring Phys: 60 TESSA N CONTE IMPRESSIONS  1. Left ventricular ejection fraction, by  estimation, is 60 to 65%. The left ventricle has normal function. The left ventricle has no regional wall motion abnormalities. Left ventricular diastolic parameters were normal.  2. Right ventricular systolic function is normal. The right ventricular size is normal. There is normal pulmonary artery systolic pressure.  3. The mitral valve is normal in structure. Trivial mitral valve regurgitation. No evidence of mitral stenosis.  4. The aortic valve is tricuspid. Aortic valve regurgitation is not visualized. No aortic stenosis is present.  5. The inferior vena cava is normal in size with greater than 50% respiratory variability, suggesting right atrial pressure of 3 mmHg. Comparison(s): No significant change from prior study. Ascending aorta previously noted as mildly dilated; not visualized well on current study. Conclusion(s)/Recommendation(s): Normal biventricular function without evidence of hemodynamically significant valvular heart disease. FINDINGS  Left Ventricle: Left ventricular ejection fraction, by estimation, is 60 to 65%. The left ventricle has normal function. The left ventricle has no regional wall motion abnormalities. 3D ejection fraction reviewed and evaluated as part of the interpretation. Alternate measurement of EF is felt to be most reflective of LV function. The left ventricular internal  cavity size was normal in size. There is no left ventricular hypertrophy. Left ventricular diastolic parameters were normal. Right Ventricle: The right ventricular size is normal. No increase in right ventricular wall thickness. Right ventricular systolic function is normal. There is normal pulmonary artery systolic pressure. The tricuspid regurgitant velocity is 1.97 m/s, and  with an assumed right atrial pressure of 3 mmHg, the estimated right ventricular systolic pressure is 18.5 mmHg. Left Atrium: Left atrial size was normal in size. Right Atrium: Right atrial size was normal in size. Pericardium: Trivial  pericardial effusion is present. Mitral Valve: The mitral valve is normal in structure. Trivial mitral valve regurgitation. No evidence of mitral valve stenosis. Tricuspid Valve: The tricuspid valve is normal in structure. Tricuspid valve regurgitation is trivial. No evidence of tricuspid stenosis. Aortic Valve: The aortic valve is tricuspid. Aortic valve regurgitation is not visualized. No aortic stenosis is present. Pulmonic Valve: The pulmonic valve was not well visualized. Pulmonic valve regurgitation is trivial. No evidence of pulmonic stenosis. Aorta: The aortic root is normal in size and structure. Venous: The inferior vena cava is normal in size with greater than 50% respiratory variability, suggesting right atrial pressure of 3 mmHg. IAS/Shunts: The atrial septum is grossly normal. Additional Comments: 3D was performed not requiring image post processing on an independent workstation and was normal.  LEFT VENTRICLE PLAX 2D LVIDd:         4.80 cm   Diastology LVIDs:         3.30 cm   LV e' medial:    8.38 cm/s LV PW:         1.00 cm   LV E/e' medial:  9.2 LV IVS:        1.10 cm   LV e' lateral:   12.10 cm/s LVOT diam:     2.10 cm   LV E/e' lateral: 6.4 LV SV:         94 LV SV Index:   42 LVOT Area:     3.46 cm LV IVRT:       99 msec                          3D Volume EF:                          3D EF:        75 %                          LV EDV:       85 ml                          LV ESV:       21 ml                          LV SV:        64 ml RIGHT VENTRICLE RV Basal diam:  3.40 cm     PULMONARY VEINS RV S prime:     11.90 cm/s  A Reversal Velocity: 29.30 cm/s TAPSE (M-mode): 2.6 cm      Diastolic Velocity:  39.10 cm/s RVSP:           18.5 mmHg   S/D Velocity:        1.30  Systolic Velocity:   49.70 cm/s LEFT ATRIUM             Index        RIGHT ATRIUM           Index LA diam:        3.80 cm 1.71 cm/m   RA Pressure: 3.00 mmHg LA Vol (A2C):   68.0 ml 30.68 ml/m  RA Area:      13.00 cm LA Vol (A4C):   43.7 ml 19.72 ml/m  RA Volume:   28.80 ml  13.00 ml/m LA Biplane Vol: 54.6 ml 24.64 ml/m  AORTIC VALVE LVOT Vmax:   128.00 cm/s LVOT Vmean:  84.600 cm/s LVOT VTI:    0.270 m  AORTA Ao Root diam: 3.40 cm MITRAL VALVE               TRICUSPID VALVE MV Area (PHT):             TR Peak grad:   15.5 mmHg MV Decel Time:             TR Vmax:        197.00 cm/s MV E velocity: 77.00 cm/s  Estimated RAP:  3.00 mmHg MV A velocity: 84.90 cm/s  RVSP:           18.5 mmHg MV E/A ratio:  0.91                            SHUNTS                            Systemic VTI:  0.27 m                            Systemic Diam: 2.10 cm Shelda Bruckner MD Electronically signed by Shelda Bruckner MD Signature Date/Time: 01/22/2024/3:11:50 PM    Final     Labs: CBC    Component Value Date/Time   WBC 6.0 12/11/2023 0850   RBC 4.92 12/11/2023 0851   RBC 4.90 12/11/2023 0850   HGB 13.8 12/11/2023 0850   HGB 11.7 (L) 10/31/2023 0900   HCT 43.2 12/11/2023 0850   HCT 37.4 (L) 10/31/2023 0900   PLT 151 12/11/2023 0850   PLT 175 10/31/2023 0900   MCV 88.2 12/11/2023 0850   MCV 90 10/31/2023 0900    CMP     Component Value Date/Time   NA 139 10/16/2023 0947   K 3.9 10/16/2023 0947   CL 100 10/16/2023 0947   CO2 22 10/16/2023 0947   GLUCOSE 93 10/16/2023 0947   GLUCOSE 95 04/19/2013 1956   BUN 26 10/16/2023 0947   CREATININE 0.65 (L) 10/16/2023 0947   CALCIUM  9.0 10/16/2023 0947   PROT 6.7 10/16/2023 0947   ALBUMIN 4.1 10/16/2023 0947   AST 18 10/16/2023 0947   ALT 22 10/16/2023 0947   ALKPHOS 97 10/16/2023 0947   BILITOT 0.3 10/16/2023 0947   GFRNONAA 83 11/18/2019 0801   GFRAA 96 11/18/2019 0801    Assessment and Plan:   QADIR FOLKS is a 63 y.o. y/o male has been referred for   1.  Iron deficiency anemia: Possibly due to right knee replacement, left knee replacement, right hip replacement in the past year.  Suspect due to blood loss from orthopedic surgeries.  Has been  on Celebrex  NSAID.  Rule out GI  etiology for iron deficiency anemia. - Scheduling EGD & Colonoscopy I discussed risks of EGD and colonoscopy with patient to include risk of bleeding, perforation, and risk of sedation.  Patient expressed understanding and agrees to proceed with procedures.    2.  Hiatal hernia - Patient Education Discussed  3.  GERD - Continue Omeprazole  20mg  1 capsule twice daily - Recommend Lifestyle Modifications to prevent Acid Reflux.  Rec. Avoid coffee, sodas, peppermint, garlic, onions, alcohol, citrus fruits, chocolate, tomatoes, fatty and spicey foods.  Avoid eating 2-3 hours before bedtime.    Follow up based on EGD and colonoscopy results and GI symptoms.  Ellouise Console, PA-C

## 2024-02-12 NOTE — Patient Instructions (Addendum)
 You have been scheduled for an Endoscopy and Colonoscopy. Please follow the written instructions given to you at your visit today.  If you use inhalers (even only as needed), please bring them with you on the day of your procedure.  DO NOT TAKE 7 DAYS PRIOR TO TEST- Trulicity (dulaglutide) Ozempic , Wegovy  (semaglutide ) Mounjaro (tirzepatide ) Bydureon Bcise (exanatide extended release)  DO NOT TAKE 1 DAY PRIOR TO YOUR TEST Rybelsus  (semaglutide ) Adlyxin (lixisenatide) Victoza (liraglutide) Byetta (exanatide) ___________________________________________________________________________  Please follow up sooner if symptoms increase or worsen __________________________________________________________________________  Due to recent changes in healthcare laws, you may see the results of your imaging and laboratory studies on MyChart before your provider has had a chance to review them.  We understand that in some cases there may be results that are confusing or concerning to you. Not all laboratory results come back in the same time frame and the provider may be waiting for multiple results in order to interpret others.  Please give us  48 hours in order for your provider to thoroughly review all the results before contacting the office for clarification of your results.   Thank you for trusting me with your gastrointestinal care!   Ellouise Console, PA-C _______________________________________________________  If your blood pressure at your visit was 140/90 or greater, please contact your primary care physician to follow up on this.  _______________________________________________________  If you are age 38 or older, your body mass index should be between 23-30. Your Body mass index is 37.31 kg/m. If this is out of the aforementioned range listed, please consider follow up with your Primary Care Provider.  If you are age 88 or younger, your body mass index should be between 19-25. Your Body  mass index is 37.31 kg/m. If this is out of the aformentioned range listed, please consider follow up with your Primary Care Provider.   ________________________________________________________  The [facility] providers would like to encourage you to use MYCHART to communicate with providers for non-urgent requests or questions.  Due to long hold times on the telephone, sending your provider a message by [facility] may be a faster and more efficient way to get a response.  Please allow 48 business hours for a response.  Please remember that this is for non-urgent requests.  _______________________________________________________

## 2024-02-16 ENCOUNTER — Other Ambulatory Visit: Payer: Self-pay | Admitting: Family Medicine

## 2024-02-19 NOTE — Telephone Encounter (Signed)
 He should be advancing to the next strength.  He has ALL strengths on file at the pharmacy, he just needs to call each month for the next dose.

## 2024-02-19 NOTE — Telephone Encounter (Signed)
 Last OV 10/16/23. Last RF by historical provider. Next OV 04/09/24

## 2024-02-22 ENCOUNTER — Telehealth: Payer: 59 | Admitting: Adult Health

## 2024-02-22 ENCOUNTER — Encounter: Payer: Self-pay | Admitting: Adult Health

## 2024-02-22 DIAGNOSIS — G4736 Sleep related hypoventilation in conditions classified elsewhere: Secondary | ICD-10-CM | POA: Diagnosis not present

## 2024-02-22 DIAGNOSIS — G4733 Obstructive sleep apnea (adult) (pediatric): Secondary | ICD-10-CM | POA: Diagnosis not present

## 2024-02-22 NOTE — Progress Notes (Signed)
 [facility] [address] [place]. [place] [address] [phone]       OFFICE FOLLOW UP NOTE  Jacob Cline Date of Birth:  31-Oct-1960 Medical Record Number:  [id number]    Primary neurologist: Dr. Chalice Reason for visit: CPAP follow-up   Virtual Visit via Video Note  I connected with Jacob Cline on 02/22/24 at  2:30 PM EDT by a video enabled telemedicine application and verified that I am speaking with the correct person using two identifiers.  Location: Patient: at home Provider: in office, [facility]   I discussed the limitations of evaluation and management by telemedicine and the availability of in person appointments. The patient expressed understanding and agreed to proceed.     SUBJECTIVE:  Follow-up visit:  Prior visit: 02/16/2023  Brief HPI:   Jacob Cline is a 63 y.o. male who was initially evaluated by Dr. Chalice on 08/17/2020 as requested by DOT to evaluate for sleep apnea. ESS 4/24. FSS 30/63.  HST 08/28/2020 showed severe OSA with total AHI of 53.7/h and multiple brief oxygen desaturations.  AutoPap initiated 09/17/2020.  Patient completed ONO on CPAP with persistent nocturnal hypoxemia and recommended bleeding 2L/min O2 via CPAP. Repeat ONO showed improvement of hypoxemia at 2L O2.     Interval history:  Patient returns for yearly CPAP follow-up visit via MyChart video visit.  Patient overall continues to do well with CPAP therapy.  Continues to use fullface mask.  He has noticed over the past several months having more issues with dry mouth.  Reports humidification on auto.  He will use Biotene which does help but at times will wake up in the middle of the night and will need to use again.  He otherwise has no questions or concerns at this time. Routinely follows with DME [employer], up to date on supplies. No questions or concerns today.            ROS:   14 system review of systems performed and negative with exception  of those listed in HPI  PMH:  Past Medical History:  Diagnosis Date   Aortic aneurysm    Back pain    BPH (benign prostatic hypertrophy)    Cyst of left kidney    Edema of both lower extremities    GERD (gastroesophageal reflux disease)    Hemorrhoids    Hiatal hernia    Hyperlipidemia    Hypertension    IDA (iron deficiency anemia)    Irregular heart beat    Joint pain    Obesity    Post-operative nausea and vomiting    Vitamin D  deficiency     PSH:  Past Surgical History:  Procedure Laterality Date   athroscopy of rt knee  06/2000   henia - umbilical surgery  07/17/2007   HERNIA REPAIR  05/15/2017   double hernia surgery   HIP SURGERY Bilateral 11/2014   right hip 2016, left hip 2024   MOUTH SURGERY     VASECTOMY     WRIST FRACTURE SURGERY Left 2023    Social History:  Social History   Socioeconomic History   Marital status: Married    Spouse name: Not on file   Number of children: Not on file   Years of education: Not on file   Highest education level: 12th grade  Occupational History   Occupation: Full time   Occupation: Naval architect  Tobacco Use   Smoking status: Never   Smokeless tobacco: Never  Vaping Use   Vaping status: Never Used  Substance and Sexual Activity   Alcohol use: Yes    Comment: socially   Drug use: No   Sexual activity: Not on file  Other Topics Concern   Not on file  Social History Narrative   Not on file   Social Drivers of Health   Financial Resource Strain: Low Risk  (05/02/2023)   Overall Financial Resource Strain (CARDIA)    Difficulty of Paying Living Expenses: Not very hard  Food Insecurity: No Food Insecurity (12/11/2023)   Hunger Vital Sign    Worried About Running Out of Food in the Last Year: Never true    Ran Out of Food in the Last Year: Never true  Transportation Needs: No Transportation Needs (12/11/2023)   PRAPARE - Administrator, Civil Service (Medical): No    Lack of Transportation  (Non-Medical): No  Physical Activity: Inactive (05/02/2023)   Exercise Vital Sign    Days of Exercise per Week: 1 day    Minutes of Exercise per Session: 0 min  Stress: No Stress Concern Present (08/29/2023)   Received from [facility] of Occupational Health - Occupational Stress Questionnaire    Feeling of Stress : Not at all  Social Connections: Moderately Isolated (05/02/2023)   Social Connection and Isolation Panel    Frequency of Communication with Friends and Family: Twice a week    Frequency of Social Gatherings with Friends and Family: Once a week    Attends Religious Services: Never    Database administrator or Organizations: No    Attends Engineer, structural: Not on file    Marital Status: Married  Catering manager Violence: Not At Risk (12/11/2023)   Humiliation, Afraid, Rape, and Kick questionnaire    Fear of Current or Ex-Partner: No    Emotionally Abused: No    Physically Abused: No    Sexually Abused: No    Family History:  Family History  Problem Relation Age of Onset   Diabetes Mother    Scleroderma Sister    Diabetes Brother    Diabetes Brother    Colon cancer Other        family history    Prostate cancer Other        family history    Lung disease Neg Hx     Medications:   Current Outpatient Medications on File Prior to Visit  Medication Sig Dispense Refill   acetaminophen  (TYLENOL ) 500 MG tablet Take 500 mg by mouth every 6 (six) hours as needed.     amoxicillin  (AMOXIL ) 500 MG capsule Take 500 mg by mouth as needed (for dental procedures).     celecoxib  (CELEBREX ) 100 MG capsule Take 1 capsule (100 mg total) by mouth 2 (two) times daily. 180 capsule 3   Cinnamon 500 MG capsule Take 1,000 mg by mouth daily.  (Patient not taking: Reported on 02/12/2024)     ferrous sulfate 325 (65 FE) MG EC tablet Take 1 tablet (325 mg total) by mouth every other day. 120 tablet 0   Multiple Vitamin (MULTIVITAMIN) tablet Take 1 tablet by  mouth daily.     nitroGLYCERIN  (NITROSTAT ) 0.4 MG SL tablet Place 1 tablet (0.4 mg total) under the tongue every 5 (five) minutes as needed for chest pain. 90 tablet 3   Omega-3 Fatty Acids (FISH OIL ) 1000 MG CAPS Take 1 capsule by mouth daily.      omeprazole  (PRILOSEC) 20  MG capsule TAKE 1 CAPSULE BY MOUTH TWICE DAILY BEFORE MEAL(S) 180 capsule 0   PARoxetine  (PAXIL -CR) 25 MG 24 hr tablet Take 1 tablet (25 mg total) by mouth daily. 100 tablet 3   pravastatin  (PRAVACHOL ) 40 MG tablet Take 1 tablet (40 mg total) by mouth at bedtime. 90 tablet 4   Semaglutide -Weight Management 1 MG/0.5ML SOAJ Inject 1 mg into the skin once a week for 28 days. 2 mL 0   [START ON 02/29/2024] Semaglutide -Weight Management 1.7 MG/0.75ML SOAJ Inject 1.7 mg into the skin once a week. 3 mL 12   [START ON 03/29/2024] Semaglutide -Weight Management 2.4 MG/0.75ML SOAJ Inject 2.4 mg into the skin once a week. 3 mL 12   telmisartan  (MICARDIS ) 40 MG tablet Take 1 tablet (40 mg total) by mouth daily. 100 tablet 3   triamterene -hydrochlorothiazide (MAXZIDE-25) 37.5-25 MG tablet Take 1 tablet by mouth daily. 100 tablet 3   TURMERIC PO Take 1 capsule by mouth daily.  (Patient not taking: Reported on 02/12/2024)     WEGOVY  0.5 MG/0.5ML SOAJ SQ injection Inject 0.5 mg into the skin once a week.     No current facility-administered medications on file prior to visit.    Allergies:   Allergies  Allergen Reactions   Other Anaphylaxis    Make me hurt alot   Codeine     Unknown    Lovastatin     Makes me hurt alot   Niaspan [Niacin Er (Antihyperlipidemic)]     Make me hurt alot      OBJECTIVE:  Physical Exam  General: well developed, well nourished, very pleasant middle-age Caucasian male, seated, in no evident distress  Neurologic Exam Mental Status: Awake and fully alert. Oriented to place and time. Recent and remote memory intact. Attention span, concentration and fund of knowledge appropriate. Mood and affect  appropriate.        ASSESSMENT/PLAN: Jacob Cline is a 63 y.o. year old male    OSA on CPAP :  Nocturnal hypoxemia:  Compliance report shows satisfactory usage with optimal residual AHI.   Will adjust pressure settings to hopefully help with dry mouth. Will adjust from 6-18 to 6-16 with EPR 2.  Continue bled in O2 at 2L/min.   Discussed other ways to help with dry mouth such as making manual adjustments to humidification settings (place machine lower than bed if condensation buildup) and continued use of Biotene. He was advised to call if he continues to have issues.  Discussed continued nightly usage with ensuring greater than 4 hours nightly for optimal benefit and per insurance purposes.   Continue to follow with DME company for any needed supplies or CPAP related concerns CPAP set up 09/2020     Follow up in 1 year via MyChart VV or call earlier if needed   CC:  PCP: Jolinda Norene HERO, DO       Harlene Bogaert, AGNP-BC  [facility] [address] [place], [place] [address]  Phone [phone] Fax [phone] Note: This document was prepared with digital dictation and possible smart phrase technology. Any transcriptional errors that result from this process are unintentional.

## 2024-03-28 ENCOUNTER — Other Ambulatory Visit: Payer: Self-pay | Admitting: Family Medicine

## 2024-03-28 DIAGNOSIS — K219 Gastro-esophageal reflux disease without esophagitis: Secondary | ICD-10-CM

## 2024-04-08 ENCOUNTER — Other Ambulatory Visit: Payer: Self-pay | Admitting: Internal Medicine

## 2024-04-08 ENCOUNTER — Ambulatory Visit: Admitting: Internal Medicine

## 2024-04-08 ENCOUNTER — Ambulatory Visit: Admitting: Family Medicine

## 2024-04-08 ENCOUNTER — Encounter: Admitting: Internal Medicine

## 2024-04-08 ENCOUNTER — Encounter: Payer: Self-pay | Admitting: Internal Medicine

## 2024-04-08 VITALS — BP 101/66 | HR 51 | Temp 98.1°F | Resp 13 | Ht 68.0 in | Wt 245.0 lb

## 2024-04-08 DIAGNOSIS — K648 Other hemorrhoids: Secondary | ICD-10-CM

## 2024-04-08 DIAGNOSIS — D120 Benign neoplasm of cecum: Secondary | ICD-10-CM

## 2024-04-08 DIAGNOSIS — D124 Benign neoplasm of descending colon: Secondary | ICD-10-CM | POA: Diagnosis not present

## 2024-04-08 DIAGNOSIS — D122 Benign neoplasm of ascending colon: Secondary | ICD-10-CM

## 2024-04-08 DIAGNOSIS — D509 Iron deficiency anemia, unspecified: Secondary | ICD-10-CM

## 2024-04-08 DIAGNOSIS — D125 Benign neoplasm of sigmoid colon: Secondary | ICD-10-CM

## 2024-04-08 DIAGNOSIS — K635 Polyp of colon: Secondary | ICD-10-CM

## 2024-04-08 MED ORDER — SODIUM CHLORIDE 0.9 % IV SOLN: 500.0000 mL | Freq: Once | INTRAVENOUS | Status: DC

## 2024-04-08 NOTE — Progress Notes (Signed)
 Called to room to assist during endoscopic procedure.  Patient ID and intended procedure confirmed with present staff. Received instructions for my participation in the procedure from the performing physician.

## 2024-04-08 NOTE — Op Note (Signed)
 [facility] Patient Name: Jacob Cline Procedure Date: 04/08/2024 1:38 PM MRN: [id number] Endoscopist: Gordy CHRISTELLA Starch , MD, [id number] Age: 63 Referring MD:  Date of Birth: Feb 28, 1961 Gender: Male Account #: [id number] Procedure:                Upper GI endoscopy Indications:              Iron deficiency anemia Medicines:                Monitored Anesthesia Care Procedure:                Pre-Anesthesia Assessment:                           - Prior to the procedure, a History and Physical                            was performed, and patient medications and                            allergies were reviewed. The patient's tolerance of                            previous anesthesia was also reviewed. The risks                            and benefits of the procedure and the sedation                            options and risks were discussed with the patient.                            All questions were answered, and informed consent                            was obtained. Prior Anticoagulants: The patient has                            taken no anticoagulant or antiplatelet agents. ASA                            Grade Assessment: III - A patient with severe                            systemic disease. After reviewing the risks and                            benefits, the patient was deemed in satisfactory                            condition to undergo the procedure.                           After obtaining informed consent, the endoscope was  passed under direct vision. Throughout the                            procedure, the patient's blood pressure, pulse, and                            oxygen saturations were monitored continuously. The                            Olympus Scope [id number] was introduced through the                            mouth, and advanced to the second part of duodenum.                            The upper GI endoscopy was  accomplished without                            difficulty. The patient tolerated the procedure                            well. Scope In: Scope Out: Findings:                 The examined esophagus was normal.                           The entire examined stomach was normal. Biopsies                            were taken with a cold forceps for Helicobacter                            pylori testing.                           The examined duodenum was normal. Biopsies for                            histology were taken with a cold forceps for                            evaluation of celiac disease. Complications:            No immediate complications. Estimated Blood Loss:     Estimated blood loss: none. Impression:               - Normal esophagus.                           - Normal stomach. Biopsied.                           - Normal examined duodenum. Biopsied. Recommendation:           - Patient has a contact number available for  emergencies. The signs and symptoms of potential                            delayed complications were discussed with the                            patient. Return to normal activities tomorrow.                            Written discharge instructions were provided to the                            patient.                           - Resume previous diet.                           - Continue present medications.                           - Await pathology results.                           - See the other procedure note for documentation of                            additional recommendations. Gordy CHRISTELLA Starch, MD 04/08/2024 2:22:23 PM This report has been signed electronically.

## 2024-04-08 NOTE — Progress Notes (Signed)
 1348 Robinul 0.1 mg IV given due large amount of secretions upon assessment.  MD made aware, vss

## 2024-04-08 NOTE — Progress Notes (Signed)
 Per Dr. Albertus patient do not have to go to lab today. Will be able to do latter.

## 2024-04-08 NOTE — Patient Instructions (Signed)
 Discharge instructions given. Handouts polyps and Hemorrhoids. Resume previous medications. YOU HAD AN ENDOSCOPIC PROCEDURE TODAY AT THE [facility]:   Refer to the procedure report that was given to you for any specific questions about what was found during the examination.  If the procedure report does not answer your questions, please call your gastroenterologist to clarify.  If you requested that your care partner not be given the details of your procedure findings, then the procedure report has been included in a sealed envelope for you to review at your convenience later.  YOU SHOULD EXPECT: Some feelings of bloating in the abdomen. Passage of more gas than usual.  Walking can help get rid of the air that was put into your GI tract during the procedure and reduce the bloating. If you had a lower endoscopy (such as a colonoscopy or flexible sigmoidoscopy) you may notice spotting of blood in your stool or on the toilet paper. If you underwent a bowel prep for your procedure, you may not have a normal bowel movement for a few days.  Please Note:  You might notice some irritation and congestion in your nose or some drainage.  This is from the oxygen used during your procedure.  There is no need for concern and it should clear up in a day or so.  SYMPTOMS TO REPORT IMMEDIATELY:  Following lower endoscopy (colonoscopy or flexible sigmoidoscopy):  Excessive amounts of blood in the stool  Significant tenderness or worsening of abdominal pains  Swelling of the abdomen that is new, acute  Fever of 100F or higher  Following upper endoscopy (EGD)  Vomiting of blood or coffee ground material  New chest pain or pain under the shoulder blades  Painful or persistently difficult swallowing  New shortness of breath  Fever of 100F or higher  Black, tarry-looking stools  For urgent or emergent issues, a gastroenterologist can be reached at any hour by calling [phone]. Do not use  MyChart messaging for urgent concerns.    DIET:  We do recommend a small meal at first, but then you may proceed to your regular diet.  Drink plenty of fluids but you should avoid alcoholic beverages for 24 hours.  ACTIVITY:  You should plan to take it easy for the rest of today and you should NOT DRIVE or use heavy machinery until tomorrow (because of the sedation medicines used during the test).    FOLLOW UP: Our staff will call the number listed on your records the next business day following your procedure.  We will call around 7:15- 8:00 am to check on you and address any questions or concerns that you may have regarding the information given to you following your procedure. If we do not reach you, we will leave a message.     If any biopsies were taken you will be contacted by phone or by letter within the next 1-3 weeks.  Please call us  at [phone] if you have not heard about the biopsies in 3 weeks.    SIGNATURES/CONFIDENTIALITY: You and/or your care partner have signed paperwork which will be entered into your electronic medical record.  These signatures attest to the fact that that the information above on your After Visit Summary has been reviewed and is understood.  Full responsibility of the confidentiality of this discharge information lies with you and/or your care-partner.

## 2024-04-08 NOTE — Progress Notes (Signed)
 Report given to PACU, vss

## 2024-04-08 NOTE — Op Note (Signed)
 [facility] Patient Name: Jacob Cline Procedure Date: 04/08/2024 1:38 PM MRN: [id number] Endoscopist: Gordy CHRISTELLA Starch , MD, [id number] Age: 63 Referring MD:  Date of Birth: October 15, 1960 Gender: Male Account #: [id number] Procedure:                Colonoscopy Indications:              Iron deficiency anemia, last colonoscopy 2016                            (normal) Medicines:                Monitored Anesthesia Care Procedure:                Pre-Anesthesia Assessment:                           - Prior to the procedure, a History and Physical                            was performed, and patient medications and                            allergies were reviewed. The patient's tolerance of                            previous anesthesia was also reviewed. The risks                            and benefits of the procedure and the sedation                            options and risks were discussed with the patient.                            All questions were answered, and informed consent                            was obtained. Prior Anticoagulants: The patient has                            taken no anticoagulant or antiplatelet agents. ASA                            Grade Assessment: III - A patient with severe                            systemic disease. After reviewing the risks and                            benefits, the patient was deemed in satisfactory                            condition to undergo the procedure.  After obtaining informed consent, the colonoscope                            was passed under direct vision. Throughout the                            procedure, the patient's blood pressure, pulse, and                            oxygen saturations were monitored continuously. The                            Olympus Scope SN: [id number] was introduced through                            the anus and advanced to the cecum, identified by                             appendiceal orifice and ileocecal valve. The                            colonoscopy was performed without difficulty. The                            patient tolerated the procedure well. The quality                            of the bowel preparation was good. The ileocecal                            valve, appendiceal orifice, and rectum were                            photographed. Scope In: 1:58:42 PM Scope Out: 2:18:34 PM Scope Withdrawal Time: 0 hours 15 minutes 30 seconds  Total Procedure Duration: 0 hours 19 minutes 52 seconds  Findings:                 The digital rectal exam was normal.                           A 5 mm polyp was found in the cecum. The polyp was                            sessile. The polyp was removed with a cold snare.                            Resection and retrieval were complete.                           Two sessile polyps were found in the ascending                            colon. The polyps were 4 to 5 mm  in size. These                            polyps were removed with a cold snare. Resection                            and retrieval were complete.                           Two sessile polyps were found in the descending                            colon. The polyps were 4 to 6 mm in size. These                            polyps were removed with a cold snare. Resection                            and retrieval were complete.                           A 5 mm polyp was found in the sigmoid colon. The                            polyp was sessile. The polyp was removed with a                            cold snare. Resection and retrieval were complete.                           Internal hemorrhoids were found during                            retroflexion. The hemorrhoids were small. Complications:            No immediate complications. Estimated Blood Loss:     Estimated blood loss was minimal. Impression:               - One 5 mm  polyp in the cecum, removed with a cold                            snare. Resected and retrieved.                           - Two 4 to 5 mm polyps in the ascending colon,                            removed with a cold snare. Resected and retrieved.                           - Two 4 to 6 mm polyps in the descending colon,                            removed  with a cold snare. Resected and retrieved.                           - One 5 mm polyp in the sigmoid colon, removed with                            a cold snare. Resected and retrieved.                           - Small internal hemorrhoids. Recommendation:           - Patient has a contact number available for                            emergencies. The signs and symptoms of potential                            delayed complications were discussed with the                            patient. Return to normal activities tomorrow.                            Written discharge instructions were provided to the                            patient.                           - Resume previous diet.                           - Continue present medications.                           - Await pathology results.                           - Replace iron by mouth.                           - Repeat CBC, IBC + ferritin.                           - Iron deficiency and anemia (the latter known to                            have improved) is most likely post-surgical after                            joint replacements. If persistent VCE is                            recommended.                           - Repeat colonoscopy  is recommended for                            surveillance. The colonoscopy date will be                            determined after pathology results from today's                            exam become available for review. Gordy CHRISTELLA Starch, MD 04/08/2024 2:26:32 PM This report has been signed electronically.

## 2024-04-08 NOTE — Progress Notes (Signed)
 GASTROENTEROLOGY PROCEDURE H&P NOTE   Primary Care Physician: Jolinda Norene HERO, DO    Reason for Procedure:  Iron deficiency anemia  Plan:    EGD and colonoscopy  Patient is appropriate for endoscopic procedure(s) in the ambulatory ([facility]) setting.  The nature of the procedure, as well as the risks, benefits, and alternatives were carefully and thoroughly reviewed with the patient. Ample time for discussion and questions allowed.  All questions were answered. The patient understood, was satisfied, and agreed with the plan to proceed.    HPI: Jacob Cline is a 63 y.o. male who presents for EGD and colonoscopy.  Medical history as below.  Tolerated the prep.  No recent chest pain or shortness of breath.  No abdominal pain today.  Past Medical History:  Diagnosis Date   Allergy    Aortic aneurysm    Back pain    BPH (benign prostatic hypertrophy)    Cyst of left kidney    Edema of both lower extremities    GERD (gastroesophageal reflux disease)    Hemorrhoids    Hiatal hernia    Hyperlipidemia    Hypertension    IDA (iron deficiency anemia)    Irregular heart beat    Joint pain    Obesity    Post-operative nausea and vomiting    Sleep apnea    Vitamin D  deficiency     Past Surgical History:  Procedure Laterality Date   athroscopy of rt knee  06/2000   henia - umbilical surgery  07/17/2007   HERNIA REPAIR  05/15/2017   double hernia surgery   HIP SURGERY Bilateral 11/2014   right hip 2016, left hip 2024   MOUTH SURGERY     VASECTOMY     WRIST FRACTURE SURGERY Left 2023    Prior to Admission medications   Medication Sig Start Date End Date Taking? Authorizing Provider  acetaminophen  (TYLENOL ) 500 MG tablet Take 500 mg by mouth every 6 (six) hours as needed.   Yes [provider]  celecoxib  (CELEBREX ) 100 MG capsule Take 1 capsule (100 mg total) by mouth 2 (two) times daily. 07/26/23  Yes Gottschalk, Ashly M, DO  ferrous sulfate  325 (65 FE) MG EC  tablet Take 1 tablet (325 mg total) by mouth every other day. 02/05/24  Yes Pennington, Rebekah M, PA-C  omeprazole  (PRILOSEC) 20 MG capsule TAKE 1 CAPSULE BY MOUTH TWICE DAILY BEFORE MEAL(S) 03/28/24  Yes Gottschalk, Ashly M, DO  PARoxetine  (PAXIL -CR) 25 MG 24 hr tablet Take 1 tablet (25 mg total) by mouth daily. 10/16/23  Yes Gottschalk, Ashly M, DO  pravastatin  (PRAVACHOL ) 40 MG tablet Take 1 tablet (40 mg total) by mouth at bedtime. 12/04/23  Yes Conte, Tessa N, PA-C  telmisartan  (MICARDIS ) 40 MG tablet Take 1 tablet (40 mg total) by mouth daily. 12/04/23  Yes Conte, Tessa N, PA-C  triamterene -hydrochlorothiazide (MAXZIDE-25) 37.5-25 MG tablet Take 1 tablet by mouth daily. 12/04/23  Yes Conte, Tessa N, PA-C  amoxicillin  (AMOXIL ) 500 MG capsule Take 500 mg by mouth as needed (for dental procedures). 08/07/23   [provider]  Cinnamon 500 MG capsule Take 1,000 mg by mouth daily.  Patient not taking: Reported on 02/12/2024    [provider]  Multiple Vitamin (MULTIVITAMIN) tablet Take 1 tablet by mouth daily. Patient not taking: Reported on 04/08/2024    [provider]  nitroGLYCERIN  (NITROSTAT ) 0.4 MG SL tablet Place 1 tablet (0.4 mg total) under the tongue every 5 (five) minutes  as needed for chest pain. 12/04/23 03/03/24  Lucien Orren SAILOR, PA-C  Omega-3 Fatty Acids (FISH OIL ) 1000 MG CAPS Take 1 capsule by mouth daily.  Patient not taking: Reported on 04/08/2024    [provider]  Semaglutide -Weight Management 1.7 MG/0.75ML SOAJ Inject 1.7 mg into the skin once a week. 02/29/24   Jolinda Norene HERO, DO  Semaglutide -Weight Management 2.4 MG/0.75ML SOAJ Inject 2.4 mg into the skin once a week. 03/29/24   Jolinda Norene HERO, DO  TURMERIC PO Take 1 capsule by mouth daily.  Patient not taking: Reported on 02/12/2024    [provider]  WEGOVY  0.5 MG/0.5ML SOAJ SQ injection Inject 0.5 mg into the skin once a week. 01/11/24   [provider]    Current  Outpatient Medications  Medication Sig Dispense Refill   acetaminophen  (TYLENOL ) 500 MG tablet Take 500 mg by mouth every 6 (six) hours as needed.     celecoxib  (CELEBREX ) 100 MG capsule Take 1 capsule (100 mg total) by mouth 2 (two) times daily. 180 capsule 3   ferrous sulfate  325 (65 FE) MG EC tablet Take 1 tablet (325 mg total) by mouth every other day. 120 tablet 0   omeprazole  (PRILOSEC) 20 MG capsule TAKE 1 CAPSULE BY MOUTH TWICE DAILY BEFORE MEAL(S) 180 capsule 0   PARoxetine  (PAXIL -CR) 25 MG 24 hr tablet Take 1 tablet (25 mg total) by mouth daily. 100 tablet 3   pravastatin  (PRAVACHOL ) 40 MG tablet Take 1 tablet (40 mg total) by mouth at bedtime. 90 tablet 4   telmisartan  (MICARDIS ) 40 MG tablet Take 1 tablet (40 mg total) by mouth daily. 100 tablet 3   triamterene -hydrochlorothiazide (MAXZIDE-25) 37.5-25 MG tablet Take 1 tablet by mouth daily. 100 tablet 3   amoxicillin  (AMOXIL ) 500 MG capsule Take 500 mg by mouth as needed (for dental procedures).     Cinnamon 500 MG capsule Take 1,000 mg by mouth daily.  (Patient not taking: Reported on 02/12/2024)     Multiple Vitamin (MULTIVITAMIN) tablet Take 1 tablet by mouth daily. (Patient not taking: Reported on 04/08/2024)     nitroGLYCERIN  (NITROSTAT ) 0.4 MG SL tablet Place 1 tablet (0.4 mg total) under the tongue every 5 (five) minutes as needed for chest pain. 90 tablet 3   Omega-3 Fatty Acids (FISH OIL ) 1000 MG CAPS Take 1 capsule by mouth daily.  (Patient not taking: Reported on 04/08/2024)     Semaglutide -Weight Management 1.7 MG/0.75ML SOAJ Inject 1.7 mg into the skin once a week. 3 mL 12   Semaglutide -Weight Management 2.4 MG/0.75ML SOAJ Inject 2.4 mg into the skin once a week. 3 mL 12   TURMERIC PO Take 1 capsule by mouth daily.  (Patient not taking: Reported on 02/12/2024)     WEGOVY  0.5 MG/0.5ML SOAJ SQ injection Inject 0.5 mg into the skin once a week.     Current Facility-Administered Medications  Medication Dose Route Frequency  Provider Last Rate Last Admin   0.9 %  sodium chloride  infusion  500 mL Intravenous Once Marva Hendryx, Gordy HERO, MD        Allergies as of 04/08/2024 - Review Complete 04/08/2024  Allergen Reaction Noted   Other Anaphylaxis 01/30/2013   Niaspan [niacin er (antihyperlipidemic)] Other (See Comments) 01/30/2013   Codeine Other (See Comments) 01/30/2013   Lovastatin Other (See Comments) 01/30/2013    Family History  Problem Relation Age of Onset   Diabetes Mother    Scleroderma Sister    Diabetes Brother  Diabetes Brother    Colon cancer Other        family history    Prostate cancer Other        family history    Lung disease Neg Hx     Social History   Socioeconomic History   Marital status: Married    Spouse name: Not on file   Number of children: Not on file   Years of education: Not on file   Highest education level: 12th grade  Occupational History   Occupation: Full time   Occupation: Naval Architect  Tobacco Use   Smoking status: Never   Smokeless tobacco: Never  Vaping Use   Vaping status: Never Used  Substance and Sexual Activity   Alcohol use: Yes    Comment: socially   Drug use: No   Sexual activity: Not on file  Other Topics Concern   Not on file  Social History Narrative   Not on file   Social Drivers of Health   Financial Resource Strain: Low Risk  (04/07/2024)   Overall Financial Resource Strain (CARDIA)    Difficulty of Paying Living Expenses: Not hard at all  Food Insecurity: No Food Insecurity (04/07/2024)   Hunger Vital Sign    Worried About Running Out of Food in the Last Year: Never true    Ran Out of Food in the Last Year: Never true  Transportation Needs: No Transportation Needs (04/07/2024)   PRAPARE - Administrator, Civil Service (Medical): No    Lack of Transportation (Non-Medical): No  Physical Activity: Inactive (04/07/2024)   Exercise Vital Sign    Days of Exercise per Week: 0 days    Minutes of Exercise per Session: Not  on file  Stress: No Stress Concern Present (04/07/2024)   [employer] of Occupational Health - Occupational Stress Questionnaire    Feeling of Stress: Not at all  Social Connections: Moderately Integrated (04/07/2024)   Social Connection and Isolation Panel    Frequency of Communication with Friends and Family: Three times a week    Frequency of Social Gatherings with Friends and Family: Twice a week    Attends Religious Services: 1 to 4 times per year    Active Member of [employer] or Organizations: No    Attends Engineer, Structural: Not on file    Marital Status: Married  Catering Manager Violence: Not At Risk (12/11/2023)   Humiliation, Afraid, Rape, and Kick questionnaire    Fear of Current or Ex-Partner: No    Emotionally Abused: No    Physically Abused: No    Sexually Abused: No    Physical Exam: Vital signs in last 24 hours: @BP  118/76   Pulse (!) 58   Temp 98.1 F (36.7 C)   Ht 5' 8 (1.727 m)   Wt 245 lb (111.1 kg)   SpO2 97%   BMI 37.25 kg/m  GEN: NAD EYE: Sclerae anicteric ENT: MMM CV: Non-tachycardic Pulm: CTA b/l GI: Soft, NT/ND NEURO:  Alert & Oriented x 3   Gordy Starch, MD [facility]  04/08/2024 1:41 PM

## 2024-04-09 ENCOUNTER — Ambulatory Visit: Payer: Self-pay | Admitting: Family Medicine

## 2024-04-09 ENCOUNTER — Telehealth: Payer: Self-pay | Admitting: *Deleted

## 2024-04-09 ENCOUNTER — Encounter: Payer: Self-pay | Admitting: Family Medicine

## 2024-04-09 DIAGNOSIS — R82998 Other abnormal findings in urine: Secondary | ICD-10-CM

## 2024-04-09 DIAGNOSIS — Z23 Encounter for immunization: Secondary | ICD-10-CM

## 2024-04-09 DIAGNOSIS — G2581 Restless legs syndrome: Secondary | ICD-10-CM

## 2024-04-09 DIAGNOSIS — R200 Anesthesia of skin: Secondary | ICD-10-CM

## 2024-04-09 DIAGNOSIS — Z7689 Persons encountering health services in other specified circumstances: Secondary | ICD-10-CM

## 2024-04-09 DIAGNOSIS — G4733 Obstructive sleep apnea (adult) (pediatric): Secondary | ICD-10-CM

## 2024-04-09 DIAGNOSIS — K449 Diaphragmatic hernia without obstruction or gangrene: Secondary | ICD-10-CM

## 2024-04-09 DIAGNOSIS — Z6836 Body mass index (BMI) 36.0-36.9, adult: Secondary | ICD-10-CM

## 2024-04-09 LAB — URINALYSIS
Bilirubin, UA: NEGATIVE
Glucose, UA: NEGATIVE
Ketones, UA: NEGATIVE
Leukocytes,UA: NEGATIVE
Nitrite, UA: NEGATIVE
Protein,UA: NEGATIVE
Specific Gravity, UA: 1.02 (ref 1.005–1.030)
Urobilinogen, Ur: 0.2 mg/dL (ref 0.2–1.0)
pH, UA: 6 (ref 5.0–7.5)

## 2024-04-09 NOTE — Telephone Encounter (Signed)
  Follow up Call-     04/08/2024   12:54 PM  Call back number  Post procedure Call Back phone  # [phone]  Permission to leave phone message Yes     Patient questions:  Do you have a fever, pain , or abdominal swelling? No. Pain Score  0 *  Have you tolerated food without any problems? Yes.    Have you been able to return to your normal activities? Yes.    Do you have any questions about your discharge instructions: Diet   No. Medications  No. Follow up visit  No.  Do you have questions or concerns about your Care? No.  Actions: * If pain score is 4 or above: No action needed, pain <4.

## 2024-04-09 NOTE — Progress Notes (Signed)
 Subjective: RR:tzphyu check PCP: Jolinda Jacob HERO, DO YEP:Jacob Cline is a 63 y.o. male presenting to clinic today for:  Morbid obesity with OSA Zepbound  ordered initially but apparently insurance coverage changed and had to switch to Wegovy .  Currently, injecting 1.7mg  weekly.  He is tolerating this without difficulty but does not feel like he is losing very much weight.  His blood pressure was controlled at his colonoscopy but he notes that he skipped his triamterene -hydrochlorothiazide that day.  He took both medicines today.  Denies any dizziness.  Reports occasional constipation for a few days following the injection but no nausea or vomiting.  No blood in stool.  He is hydrating without difficulty and eating a high-protein diet.  He does note that he feels like his urine looks a little foamy or than normal and often will have some urinary dribbling.  PSA 5 months ago was 0.6.  No dysuria or hematuria.  Has known iron deficiency and is followed by hematology for this.  He does report some occasional right hand numbness and tingling but not sure which fingers it affects.  ROS: Per HPI  Allergies  Allergen Reactions   Other Anaphylaxis    Make me hurt alot   Niaspan [Niacin Er (Antihyperlipidemic)] Other (See Comments)    Make me hurt alot   Codeine Other (See Comments)    aching   Lovastatin Other (See Comments)    Makes me hurt alot   Past Medical History:  Diagnosis Date   Allergy    Aortic aneurysm    Back pain    BPH (benign prostatic hypertrophy)    Cyst of left kidney    Edema of both lower extremities    GERD (gastroesophageal reflux disease)    Hemorrhoids    Hiatal hernia    Hyperlipidemia    Hypertension    IDA (iron deficiency anemia)    Irregular heart beat    Joint pain    Obesity    Post-operative nausea and vomiting    Sleep apnea    Vitamin D  deficiency     Current Outpatient Medications:    acetaminophen  (TYLENOL ) 500 MG tablet, Take 500 mg  by mouth every 6 (six) hours as needed., Disp: , Rfl:    amoxicillin  (AMOXIL ) 500 MG capsule, Take 500 mg by mouth as needed (for dental procedures)., Disp: , Rfl:    celecoxib  (CELEBREX ) 100 MG capsule, Take 1 capsule (100 mg total) by mouth 2 (two) times daily., Disp: 180 capsule, Rfl: 3   Cinnamon 500 MG capsule, Take 1,000 mg by mouth daily.  (Patient not taking: Reported on 02/12/2024), Disp: , Rfl:    ferrous sulfate  325 (65 FE) MG EC tablet, Take 1 tablet (325 mg total) by mouth every other day., Disp: 120 tablet, Rfl: 0   Multiple Vitamin (MULTIVITAMIN) tablet, Take 1 tablet by mouth daily. (Patient not taking: Reported on 04/08/2024), Disp: , Rfl:    nitroGLYCERIN  (NITROSTAT ) 0.4 MG SL tablet, Place 1 tablet (0.4 mg total) under the tongue every 5 (five) minutes as needed for chest pain., Disp: 90 tablet, Rfl: 3   Omega-3 Fatty Acids (FISH OIL ) 1000 MG CAPS, Take 1 capsule by mouth daily.  (Patient not taking: Reported on 04/08/2024), Disp: , Rfl:    omeprazole  (PRILOSEC) 20 MG capsule, TAKE 1 CAPSULE BY MOUTH TWICE DAILY BEFORE MEAL(S), Disp: 180 capsule, Rfl: 0   PARoxetine  (PAXIL -CR) 25 MG 24 hr tablet, Take 1 tablet (25 mg total) by mouth  daily., Disp: 100 tablet, Rfl: 3   pravastatin  (PRAVACHOL ) 40 MG tablet, Take 1 tablet (40 mg total) by mouth at bedtime., Disp: 90 tablet, Rfl: 4   Semaglutide -Weight Management 1.7 MG/0.75ML SOAJ, Inject 1.7 mg into the skin once a week., Disp: 3 mL, Rfl: 12   Semaglutide -Weight Management 2.4 MG/0.75ML SOAJ, Inject 2.4 mg into the skin once a week., Disp: 3 mL, Rfl: 12   telmisartan  (MICARDIS ) 40 MG tablet, Take 1 tablet (40 mg total) by mouth daily., Disp: 100 tablet, Rfl: 3   triamterene -hydrochlorothiazide (MAXZIDE-25) 37.5-25 MG tablet, Take 1 tablet by mouth daily., Disp: 100 tablet, Rfl: 3   TURMERIC PO, Take 1 capsule by mouth daily.  (Patient not taking: Reported on 02/12/2024), Disp: , Rfl:    WEGOVY  0.5 MG/0.5ML SOAJ SQ injection, Inject 0.5 mg  into the skin once a week., Disp: , Rfl:  Social History   Socioeconomic History   Marital status: Married    Spouse name: Not on file   Number of children: Not on file   Years of education: Not on file   Highest education level: 12th grade  Occupational History   Occupation: Full time   Occupation: Naval Architect  Tobacco Use   Smoking status: Never   Smokeless tobacco: Never  Vaping Use   Vaping status: Never Used  Substance and Sexual Activity   Alcohol use: Yes    Comment: socially   Drug use: No   Sexual activity: Not on file  Other Topics Concern   Not on file  Social History Narrative   Not on file   Social Drivers of Health   Financial Resource Strain: Low Risk  (04/07/2024)   Overall Financial Resource Strain (CARDIA)    Difficulty of Paying Living Expenses: Not hard at all  Food Insecurity: No Food Insecurity (04/07/2024)   Hunger Vital Sign    Worried About Running Out of Food in the Last Year: Never true    Ran Out of Food in the Last Year: Never true  Transportation Needs: No Transportation Needs (04/07/2024)   PRAPARE - Administrator, Civil Service (Medical): No    Lack of Transportation (Non-Medical): No  Physical Activity: Inactive (04/07/2024)   Exercise Vital Sign    Days of Exercise per Week: 0 days    Minutes of Exercise per Session: Not on file  Stress: No Stress Concern Present (04/07/2024)   [employer] of Occupational Health - Occupational Stress Questionnaire    Feeling of Stress: Not at all  Social Connections: Moderately Integrated (04/07/2024)   Social Connection and Isolation Panel    Frequency of Communication with Friends and Family: Three times a week    Frequency of Social Gatherings with Friends and Family: Twice a week    Attends Religious Services: 1 to 4 times per year    Active Member of [employer] or Organizations: No    Attends Engineer, Structural: Not on file    Marital Status: Married  Careers Information Officer Violence: Not At Risk (12/11/2023)   Humiliation, Afraid, Rape, and Kick questionnaire    Fear of Current or Ex-Partner: No    Emotionally Abused: No    Physically Abused: No    Sexually Abused: No   Family History  Problem Relation Age of Onset   Diabetes Mother    Scleroderma Sister    Diabetes Brother    Diabetes Brother    Colon cancer Other  family history    Prostate cancer Other        family history    Lung disease Neg Hx     Objective: Office vital signs reviewed. BP (!) 94/59   Pulse 62   Temp 97.8 F (36.6 C)   Ht 5' 9 (1.753 m)   Wt 242 lb 8 oz (110 kg)   SpO2 94%   BMI 35.81 kg/m   Physical Examination:  General: Awake, alert, morbidly obese, No acute distress HEENT: sclera white, MMM Cardio: regular rate and rhythm, S1S2 heard, no murmurs appreciated Pulm: clear to auscultation bilaterally, no wheezes, rhonchi or rales; normal work of breathing on room air GI: Protuberant, soft Extremities: warm, well perfused, No edema, cyanosis or clubbing; +2 pulses bilaterally MSK: Ambulating independently with normal gait and station Neuro: No tremor or ataxia appreciated  Assessment/ Plan: 63 y.o. male   Morbid obesity (HCC) - Plan: CMP14+EGFR  Encounter for weight management - Plan: CMP14+EGFR  BMI 36.0-36.9,adult - Plan: CMP14+EGFR  Severe obstructive sleep apnea - Plan: CMP14+EGFR  Hiatal hernia with gastroesophageal reflux - Plan: CMP14+EGFR  Numbness and tingling in right hand - Plan: CMP14+EGFR  Restless leg - Plan: CMP14+EGFR, CBC with Differential, Iron, TIBC and Ferritin Panel, Vitamin B12  Foamy urine - Plan: Urinalysis, CMP14+EGFR  Encounter for immunization - Plan: Flu vaccine HIGH DOSE PF(Fluzone Trivalent)   5 pound weight loss since initiation of medications.  He is early on the 1.7 mg weekly.  I still think that it may be worth him trying to get a prescription for Zepbound  and we will try again after the new year.  Given  severe obstructive sleep apnea I think this is likely a better fit for him.  Continue advancing as tolerated.  With regards to numbness and tingling this may be representative of cervical spine etiology versus carpal tunnel.  Recommend bracing, ice to the affected wrist.  Will check for any recurrent iron or B12 abnormalities that may be contributing to restless leg.  Check urinalysis, renal function given reports of foamy urine.  PSA was normal just 6 months ago so this was not repeated  Influenza vaccination administered  Jacob CHRISTELLA Fielding, DO [facility] [phone]

## 2024-04-10 ENCOUNTER — Ambulatory Visit: Payer: Self-pay | Admitting: Family Medicine

## 2024-04-10 LAB — CBC WITH DIFFERENTIAL/PLATELET
Basophils Absolute: 0 x10E3/uL (ref 0.0–0.2)
Basos: 0 %
EOS (ABSOLUTE): 0.1 x10E3/uL (ref 0.0–0.4)
Eos: 1 %
Hematocrit: 47.3 % (ref 37.5–51.0)
Hemoglobin: 15.3 g/dL (ref 13.0–17.7)
Immature Grans (Abs): 0 x10E3/uL (ref 0.0–0.1)
Immature Granulocytes: 0 %
Lymphocytes Absolute: 1.4 x10E3/uL (ref 0.7–3.1)
Lymphs: 16 %
MCH: 28.8 pg (ref 26.6–33.0)
MCHC: 32.3 g/dL (ref 31.5–35.7)
MCV: 89 fL (ref 79–97)
Monocytes Absolute: 0.6 x10E3/uL (ref 0.1–0.9)
Monocytes: 7 %
Neutrophils Absolute: 6.4 x10E3/uL (ref 1.4–7.0)
Neutrophils: 76 %
Platelets: 161 x10E3/uL (ref 150–450)
RBC: 5.31 x10E6/uL (ref 4.14–5.80)
RDW: 15 % (ref 11.6–15.4)
WBC: 8.5 x10E3/uL (ref 3.4–10.8)

## 2024-04-10 LAB — CMP14+EGFR
ALT: 29 IU/L (ref 0–44)
AST: 20 IU/L (ref 0–40)
Albumin: 4.4 g/dL (ref 3.9–4.9)
Alkaline Phosphatase: 121 IU/L (ref 47–123)
BUN/Creatinine Ratio: 23 (ref 10–24)
BUN: 16 mg/dL (ref 8–27)
Bilirubin Total: 0.3 mg/dL (ref 0.0–1.2)
CO2: 26 mmol/L (ref 20–29)
Calcium: 9.3 mg/dL (ref 8.6–10.2)
Chloride: 98 mmol/L (ref 96–106)
Creatinine, Ser: 0.71 mg/dL — ABNORMAL LOW (ref 0.76–1.27)
Globulin, Total: 2.5 g/dL (ref 1.5–4.5)
Glucose: 109 mg/dL — ABNORMAL HIGH (ref 70–99)
Potassium: 4 mmol/L (ref 3.5–5.2)
Sodium: 141 mmol/L (ref 134–144)
Total Protein: 6.9 g/dL (ref 6.0–8.5)
eGFR: 102 mL/min/1.73 (ref >59)

## 2024-04-10 LAB — IRON,TIBC AND FERRITIN PANEL
Ferritin: 40 ng/mL (ref 30–400)
Iron Saturation: 13 % — ABNORMAL LOW (ref 15–55)
Iron: 51 ug/dL (ref 38–169)
Total Iron Binding Capacity: 386 ug/dL (ref 250–450)
UIBC: 335 ug/dL (ref 111–343)

## 2024-04-10 LAB — VITAMIN B12: Vitamin B-12: 811 pg/mL (ref 232–1245)

## 2024-04-11 LAB — SURGICAL PATHOLOGY

## 2024-04-12 ENCOUNTER — Ambulatory Visit: Payer: Self-pay | Admitting: Internal Medicine

## 2024-05-22 ENCOUNTER — Telehealth: Payer: Self-pay

## 2024-05-22 NOTE — Telephone Encounter (Signed)
 Cpap orders were faxed to [facility] on 05/22/2024

## 2024-06-09 ENCOUNTER — Encounter: Payer: Self-pay | Admitting: *Deleted

## 2024-06-10 ENCOUNTER — Inpatient Hospital Stay

## 2024-06-13 NOTE — Progress Notes (Unsigned)
 "  [facility] [address], [place] [address]   CLINIC:  Medical Oncology/Hematology  PCP:  Jolinda Norene HERO, DO [address] [place] [address] [phone]   REASON FOR VISIT:  Follow-up for pancytopenia (resolved)  CURRENT THERAPY: Ferrous sulfate  every other day  INTERVAL HISTORY:   Jacob Cline 64 y.o. male returns for routine follow-up of pancytopenia. He was last seen by Pleasant Barefoot PA-C on 02/05/2024.    At today's visit, he reports feeling fairly well.  *** No recent hospitalizations, surgeries, or changes in baseline health status. He has 40***% energy and 100***% appetite.  ***He endorses that he is maintaining a stable weight.  No rectal bleeding or melena.*** He reports feeling tired, in part due to working 65-70 hours per week.*** He denies any ice pica.*** No headaches, lightheadedness, syncope, chest pain, dyspnea on exertion.*** He is taking ferrous sulfate  325 mg every other day.***   ASSESSMENT & PLAN:  1.  Normocytic anemia, RESOLVED - Labs from PCP on 10/16/2023 showed low Hgb 11.8, low reticulocyte count percent at < 0.2.  Ferritin was 336, iron saturation 55%, B12 422, folate 8.7) - Most recent colonoscopy on 05/08/2015 found no major abnormalities - FOBT (11/02/2020) was NEGATIVE.  Denies BRBPR/melena. - He had multiple surgeries in the months preceding his anemia with left TKR (April 2025), right TKR (December 2024), and left hip replacement (August 2024).  He was not placed on any iron supplement postoperatively. - Labs from PCP in June 2025 showed normal B12 and folate - Hematology workup showed iron deficiency (ferritin 14, iron saturation 9%, elevated TIBC 469), but anemia had resolved.  Additional workup as described below was UNREMARKABLE. Normal kidney function (Cystatin C 1.13, normal creatinine when checked by PCP in June 2025) EPO appropriately elevated at 20.0.  LDH normal.  DAT negative.  Reticulocytes 1.3%  (hypoproliferative) Normal copper .  Normal MMA. Immunofixation and SPEP negative.  Minimally elevated kappa light chains 28.0, with normal FLC ratio 1.27. - NGS myeloid panel was NEGATIVE for any clinically significant variants ***notify patient, was pending at last visit *** - He has been taking ferrous sulfate  325 mg every other day since September 2025 *** - Most recent labs *** - DIFFERENTIAL DIAGNOSIS favors iron deficiency anemia from multiple recent surgeries - PLAN: *** TBD.  *** Anemia has resolved, but he has persistent iron deficiency, most likely residual from multiple recent surgeries.   - Recommend starting ferrous sulfate  325 mg every other day.   - Repeat CBC/D with nutritional panel in 4 months.  If ferritin improved and no recurrent cytopenias, will discharge to PCP at that time.    2.  Thrombocytopenia and neutropenia, RESOLVED  - Labs from PCP on 10/16/2023 showed low platelets 131, low ANC 1.2 - CBC/D from 10/31/2023 showed resolved thrombocytopenia with platelets 175 - MR abdomen (08/09/2021) showed hepatic steatosis, normal spleen. - Labs from June 2025 (by PCP) showed normal folate, B12.  Negative for hepatitis C and HIV. - CBC/D (12/11/2023) showed normal WBC, normal platelets, normal differential - Most recent CBC/D *** - PLAN: *** TBD.  *** Platelets overall have been on the lower side of normal for several years, with isolated low platelets at 131 on 10/16/2023.  Platelets may tend to be on the lower side related to his fatty liver disease. - Will follow results of NGS myeloid panel.   - Repeat CBC/D with nutritional panel in 4 months. - If no major recurrent thrombocytopenia (platelets <100),  patient would be stable for discharge to PCP at next visit.  3.  Social/family history: - Lives at home with his wife.  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  Non-smoker. - No family history of significant anemia.  Does not know father side of the family.  Maternal aunt and maternal  uncle had lung cancers.  2 maternal cousins had lung cancer.  1 maternal cousin had colon cancer.  PLAN SUMMARY:***Possible discharge to PCP, pending labs *** >> Labs in 4 months = CBC/D, ferritin, iron/TIBC, B12, MMA, folate >> OFFICE visit in 4 months (1 week after labs)     REVIEW OF SYSTEMS: ***  Review of Systems  Constitutional:  Positive for fatigue. Negative for appetite change, chills, diaphoresis, fever and unexpected weight change.  HENT:   Negative for lump/mass and nosebleeds.   Eyes:  Negative for eye problems.  Respiratory:  Negative for cough, hemoptysis and shortness of breath.   Cardiovascular:  Negative for chest pain, leg swelling and palpitations.  Gastrointestinal:  Negative for abdominal pain, blood in stool, constipation, diarrhea, nausea and vomiting.  Genitourinary:  Positive for difficulty urinating (weak flow). Negative for hematuria.   Skin: Negative.   Neurological:  Negative for dizziness, headaches and light-headedness.  Hematological:  Does not bruise/bleed easily.     PHYSICAL EXAM:***  ECOG PERFORMANCE STATUS: 1 - Symptomatic but completely ambulatory  There were no vitals filed for this visit.  There were no vitals filed for this visit.  Physical Exam Constitutional:      Appearance: Normal appearance. He is obese.  Cardiovascular:     Heart sounds: Normal heart sounds.  Pulmonary:     Breath sounds: Normal breath sounds.  Neurological:     General: No focal deficit present.     Mental Status: Mental status is at baseline.  Psychiatric:        Behavior: Behavior normal. Behavior is cooperative.     PAST MEDICAL/SURGICAL HISTORY:  Past Medical History:  Diagnosis Date   Allergy    Aortic aneurysm    Back pain    BPH (benign prostatic hypertrophy)    Cyst of left kidney    Edema of both lower extremities    GERD (gastroesophageal reflux disease)    Hemorrhoids    Hiatal hernia    Hyperlipidemia    Hypertension    IDA (iron  deficiency anemia)    Irregular heart beat    Joint pain    Obesity    Post-operative nausea and vomiting    Sleep apnea    Vitamin D  deficiency    Past Surgical History:  Procedure Laterality Date   athroscopy of rt knee  06/2000   henia - umbilical surgery  07/17/2007   HERNIA REPAIR  05/15/2017   double hernia surgery   HIP SURGERY Bilateral 11/2014   right hip 2016, left hip 2024   MOUTH SURGERY     VASECTOMY     WRIST FRACTURE SURGERY Left 2023    SOCIAL HISTORY:  Social History   Socioeconomic History   Marital status: Married    Spouse name: Not on file   Number of children: Not on file   Years of education: Not on file   Highest education level: 12th grade  Occupational History   Occupation: Full time   Occupation: Naval Architect  Tobacco Use   Smoking status: Never   Smokeless tobacco: Never  Vaping Use   Vaping status: Never Used  Substance and Sexual Activity  Alcohol use: Yes    Comment: socially   Drug use: No   Sexual activity: Not on file  Other Topics Concern   Not on file  Social History Narrative   Not on file   Social Drivers of Health   Tobacco Use: Low Risk (04/15/2024)   Received from [facility]   Patient History    Smoking Tobacco Use: Never    Smokeless Tobacco Use: Never    Passive Exposure: Not on file  Financial Resource Strain: Low Risk (04/07/2024)   Overall Financial Resource Strain (CARDIA)    Difficulty of Paying Living Expenses: Not hard at all  Food Insecurity: No Food Insecurity (04/07/2024)   Epic    Worried About Programme Researcher, Broadcasting/film/video in the Last Year: Never true    Ran Out of Food in the Last Year: Never true  Transportation Needs: No Transportation Needs (04/07/2024)   Epic    Lack of Transportation (Medical): No    Lack of Transportation (Non-Medical): No  Physical Activity: Inactive (04/07/2024)   Exercise Vital Sign    Days of Exercise per Week: 0 days    Minutes of Exercise per Session: Not on file   Stress: No Stress Concern Present (04/07/2024)   [employer] of Occupational Health - Occupational Stress Questionnaire    Feeling of Stress: Not at all  Social Connections: Moderately Integrated (04/07/2024)   Social Connection and Isolation Panel    Frequency of Communication with Friends and Family: Three times a week    Frequency of Social Gatherings with Friends and Family: Twice a week    Attends Religious Services: 1 to 4 times per year    Active Member of [employer] or Organizations: No    Attends Engineer, Structural: Not on file    Marital Status: Married  Catering Manager Violence: Not At Risk (12/11/2023)   Epic    Fear of Current or Ex-Partner: No    Emotionally Abused: No    Physically Abused: No    Sexually Abused: No  Depression (PHQ2-9): Low Risk (04/09/2024)   Depression (PHQ2-9)    PHQ-2 Score: 0  Alcohol Screen: Low Risk (04/07/2024)   Alcohol Screen    Last Alcohol Screening Score (AUDIT): 2  Housing: Low Risk (04/07/2024)   Epic    Unable to Pay for Housing in the Last Year: No    Number of Times Moved in the Last Year: 0    Homeless in the Last Year: No  Utilities: Not At Risk (12/11/2023)   Epic    Threatened with loss of utilities: No  Health Literacy: Not on file    FAMILY HISTORY:  Family History  Problem Relation Age of Onset   Diabetes Mother    Scleroderma Sister    Diabetes Brother    Diabetes Brother    Colon cancer Other        family history    Prostate cancer Other        family history    Lung disease Neg Hx     CURRENT MEDICATIONS:  Outpatient Encounter Medications as of 06/17/2024  Medication Sig   acetaminophen  (TYLENOL ) 500 MG tablet Take 500 mg by mouth every 6 (six) hours as needed.   amoxicillin  (AMOXIL ) 500 MG capsule Take 500 mg by mouth as needed (for dental procedures).   celecoxib  (CELEBREX ) 100 MG capsule Take 1 capsule (100 mg total) by mouth 2 (two) times daily.   Cinnamon 500 MG capsule Take 1,000 mg by  mouth daily.  (Patient not taking: Reported on 04/09/2024)   ferrous sulfate  325 (65 FE) MG EC tablet Take 1 tablet (325 mg total) by mouth every other day.   Multiple Vitamin (MULTIVITAMIN) tablet Take 1 tablet by mouth daily. (Patient not taking: Reported on 04/09/2024)   nitroGLYCERIN  (NITROSTAT ) 0.4 MG SL tablet Place 1 tablet (0.4 mg total) under the tongue every 5 (five) minutes as needed for chest pain.   Omega-3 Fatty Acids (FISH OIL ) 1000 MG CAPS Take 1 capsule by mouth daily.  (Patient not taking: Reported on 04/09/2024)   omeprazole  (PRILOSEC) 20 MG capsule TAKE 1 CAPSULE BY MOUTH TWICE DAILY BEFORE MEAL(S)   PARoxetine  (PAXIL -CR) 25 MG 24 hr tablet Take 1 tablet (25 mg total) by mouth daily.   pravastatin  (PRAVACHOL ) 40 MG tablet Take 1 tablet (40 mg total) by mouth at bedtime.   Semaglutide -Weight Management 1.7 MG/0.75ML SOAJ Inject 1.7 mg into the skin once a week.   Semaglutide -Weight Management 2.4 MG/0.75ML SOAJ Inject 2.4 mg into the skin once a week.   telmisartan  (MICARDIS ) 40 MG tablet Take 1 tablet (40 mg total) by mouth daily.   triamterene -hydrochlorothiazide (MAXZIDE-25) 37.5-25 MG tablet Take 1 tablet by mouth daily.   TURMERIC PO Take 1 capsule by mouth daily.  (Patient not taking: Reported on 04/09/2024)   WEGOVY  0.5 MG/0.5ML SOAJ SQ injection Inject 0.5 mg into the skin once a week.   No facility-administered encounter medications on file as of 06/17/2024.    ALLERGIES:  Allergies  Allergen Reactions   Other Anaphylaxis    Make me hurt alot   Niaspan [Niacin Er (Antihyperlipidemic)] Other (See Comments)    Make me hurt alot   Codeine Other (See Comments)    aching   Lovastatin Other (See Comments)    Makes me hurt alot    LABORATORY DATA:  I have reviewed the labs as listed.  CBC    Component Value Date/Time   WBC 8.5 04/09/2024 1151   WBC 6.0 12/11/2023 0850   RBC 5.31 04/09/2024 1151   RBC 4.92 12/11/2023 0851   RBC 4.90 12/11/2023 0850   HGB 15.3  04/09/2024 1151   HCT 47.3 04/09/2024 1151   PLT 161 04/09/2024 1151   MCV 89 04/09/2024 1151   MCH 28.8 04/09/2024 1151   MCH 28.2 12/11/2023 0850   MCHC 32.3 04/09/2024 1151   MCHC 31.9 12/11/2023 0850   RDW 15.0 04/09/2024 1151   LYMPHSABS 1.4 04/09/2024 1151   MONOABS 0.6 12/11/2023 0850   EOSABS 0.1 04/09/2024 1151   BASOSABS 0.0 04/09/2024 1151      Latest Ref Rng & Units 04/09/2024   11:51 AM 10/16/2023    9:47 AM 07/24/2023    9:36 AM  CMP  Glucose 70 - 99 mg/dL 890  93  897   BUN 8 - 27 mg/dL 16  26  22    Creatinine 0.76 - 1.27 mg/dL 9.28  9.34  9.28   Sodium 134 - 144 mmol/L 141  139  141   Potassium 3.5 - 5.2 mmol/L 4.0  3.9  4.1   Chloride 96 - 106 mmol/L 98  100  101   CO2 20 - 29 mmol/L 26  22  25    Calcium  8.6 - 10.2 mg/dL 9.3  9.0  9.2   Total Protein 6.0 - 8.5 g/dL 6.9  6.7  6.6   Total Bilirubin 0.0 - 1.2 mg/dL 0.3  0.3  0.3   Alkaline Phos 47 - 123  IU/L 121  97  108   AST 0 - 40 IU/L 20  18  16    ALT 0 - 44 IU/L 29  22  20      DIAGNOSTIC IMAGING:  I have independently reviewed the relevant imaging and discussed with the patient.   WRAP UP:  All questions were answered. The patient knows to call the clinic with any problems, questions or concerns.  Medical decision making: Moderate  Time spent on visit: I spent 20 minutes counseling the patient face to face. The total time spent in the appointment was 30 minutes and more than 50% was on counseling.  Pleasant CHRISTELLA Barefoot, PA-C  *** "

## 2024-06-14 ENCOUNTER — Inpatient Hospital Stay

## 2024-06-14 DIAGNOSIS — D500 Iron deficiency anemia secondary to blood loss (chronic): Secondary | ICD-10-CM

## 2024-06-14 LAB — IRON AND TIBC
Iron: 81 ug/dL (ref 45–182)
Saturation Ratios: 21 % (ref 17.9–39.5)
TIBC: 379 ug/dL (ref 250–450)
UIBC: 298 ug/dL

## 2024-06-14 LAB — CBC WITH DIFFERENTIAL/PLATELET
Abs Immature Granulocytes: 0.02 10*3/uL (ref 0.00–0.07)
Basophils Absolute: 0 10*3/uL (ref 0.0–0.1)
Basophils Relative: 0 %
Eosinophils Absolute: 0.1 10*3/uL (ref 0.0–0.5)
Eosinophils Relative: 1 %
HCT: 46.9 % (ref 39.0–52.0)
Hemoglobin: 15.2 g/dL (ref 13.0–17.0)
Immature Granulocytes: 0 %
Lymphocytes Relative: 21 %
Lymphs Abs: 1.6 10*3/uL (ref 0.7–4.0)
MCH: 28.9 pg (ref 26.0–34.0)
MCHC: 32.4 g/dL (ref 30.0–36.0)
MCV: 89.2 fL (ref 80.0–100.0)
Monocytes Absolute: 0.8 10*3/uL (ref 0.1–1.0)
Monocytes Relative: 11 %
Neutro Abs: 5 10*3/uL (ref 1.7–7.7)
Neutrophils Relative %: 67 %
Platelets: 160 10*3/uL (ref 150–400)
RBC: 5.26 MIL/uL (ref 4.22–5.81)
RDW: 14.8 % (ref 11.5–15.5)
WBC: 7.5 10*3/uL (ref 4.0–10.5)
nRBC: 0 % (ref 0.0–0.2)

## 2024-06-14 LAB — VITAMIN B12: Vitamin B-12: 598 pg/mL (ref 180–914)

## 2024-06-14 LAB — FOLATE: Folate: 9.7 ng/mL

## 2024-06-14 LAB — FERRITIN: Ferritin: 51 ng/mL (ref 24–336)

## 2024-06-17 ENCOUNTER — Inpatient Hospital Stay: Admitting: Physician Assistant

## 2024-07-22 ENCOUNTER — Ambulatory Visit: Admitting: Family Medicine
